# Patient Record
Sex: Female | Born: 1960 | Race: White | Hispanic: No | Smoking: Current every day smoker
Health system: Southern US, Community
[De-identification: ages and names within clinical notes are randomized; demographics above are authoritative.]

## PROBLEM LIST (undated history)

## (undated) DIAGNOSIS — E079 Disorder of thyroid, unspecified: Secondary | ICD-10-CM

## (undated) DIAGNOSIS — S060XAA Concussion with loss of consciousness status unknown, initial encounter: Secondary | ICD-10-CM

## (undated) DIAGNOSIS — K219 Gastro-esophageal reflux disease without esophagitis: Secondary | ICD-10-CM

## (undated) DIAGNOSIS — I10 Essential (primary) hypertension: Secondary | ICD-10-CM

## (undated) DIAGNOSIS — S060X9A Concussion with loss of consciousness of unspecified duration, initial encounter: Secondary | ICD-10-CM

## (undated) DIAGNOSIS — R42 Dizziness and giddiness: Secondary | ICD-10-CM

## (undated) HISTORY — DX: Concussion with loss of consciousness of unspecified duration, initial encounter: S06.0X9A

## (undated) HISTORY — PX: DIAGNOSTIC LAPAROSCOPY: SUR761

## (undated) HISTORY — DX: Concussion with loss of consciousness status unknown, initial encounter: S06.0XAA

## (undated) HISTORY — DX: Dizziness and giddiness: R42

---

## 1998-06-24 ENCOUNTER — Other Ambulatory Visit: Admission: RE | Admit: 1998-06-24 | Discharge: 1998-06-24 | Payer: Self-pay | Admitting: *Deleted

## 1998-07-22 ENCOUNTER — Encounter: Payer: Self-pay | Admitting: *Deleted

## 1998-07-22 ENCOUNTER — Ambulatory Visit (HOSPITAL_COMMUNITY): Admission: RE | Admit: 1998-07-22 | Discharge: 1998-07-22 | Payer: Self-pay

## 1998-10-14 ENCOUNTER — Emergency Department (HOSPITAL_COMMUNITY): Admission: EM | Admit: 1998-10-14 | Discharge: 1998-10-14 | Payer: Self-pay | Admitting: *Deleted

## 1999-09-16 ENCOUNTER — Other Ambulatory Visit: Admission: RE | Admit: 1999-09-16 | Discharge: 1999-09-16 | Payer: Self-pay | Admitting: *Deleted

## 2000-12-06 ENCOUNTER — Emergency Department (HOSPITAL_COMMUNITY): Admission: EM | Admit: 2000-12-06 | Discharge: 2000-12-07 | Payer: Self-pay | Admitting: Emergency Medicine

## 2002-05-01 ENCOUNTER — Encounter: Admission: RE | Admit: 2002-05-01 | Discharge: 2002-05-01 | Payer: Self-pay | Admitting: Internal Medicine

## 2002-05-01 ENCOUNTER — Encounter: Payer: Self-pay | Admitting: Internal Medicine

## 2005-11-09 ENCOUNTER — Encounter: Admission: RE | Admit: 2005-11-09 | Discharge: 2005-11-09 | Payer: Self-pay | Admitting: Internal Medicine

## 2006-06-23 ENCOUNTER — Encounter: Admission: RE | Admit: 2006-06-23 | Discharge: 2006-06-23 | Payer: Self-pay | Admitting: Internal Medicine

## 2007-05-09 ENCOUNTER — Ambulatory Visit (HOSPITAL_COMMUNITY): Admission: RE | Admit: 2007-05-09 | Discharge: 2007-05-09 | Payer: Self-pay | Admitting: Family Medicine

## 2010-06-07 ENCOUNTER — Encounter (HOSPITAL_BASED_OUTPATIENT_CLINIC_OR_DEPARTMENT_OTHER): Payer: Self-pay | Admitting: Internal Medicine

## 2013-01-22 ENCOUNTER — Emergency Department (HOSPITAL_COMMUNITY)
Admission: EM | Admit: 2013-01-22 | Discharge: 2013-01-23 | Disposition: A | Discharge status: Home / Self Care | Payer: Self-pay | Attending: Emergency Medicine | Admitting: Emergency Medicine

## 2013-01-22 ENCOUNTER — Encounter (HOSPITAL_COMMUNITY): Payer: Self-pay | Admitting: *Deleted

## 2013-01-22 DIAGNOSIS — Z862 Personal history of diseases of the blood and blood-forming organs and certain disorders involving the immune mechanism: Secondary | ICD-10-CM | POA: Insufficient documentation

## 2013-01-22 DIAGNOSIS — Z79899 Other long term (current) drug therapy: Secondary | ICD-10-CM | POA: Insufficient documentation

## 2013-01-22 DIAGNOSIS — N898 Other specified noninflammatory disorders of vagina: Secondary | ICD-10-CM | POA: Insufficient documentation

## 2013-01-22 DIAGNOSIS — K5900 Constipation, unspecified: Secondary | ICD-10-CM | POA: Insufficient documentation

## 2013-01-22 DIAGNOSIS — F17200 Nicotine dependence, unspecified, uncomplicated: Secondary | ICD-10-CM | POA: Insufficient documentation

## 2013-01-22 DIAGNOSIS — I10 Essential (primary) hypertension: Secondary | ICD-10-CM | POA: Insufficient documentation

## 2013-01-22 DIAGNOSIS — R197 Diarrhea, unspecified: Secondary | ICD-10-CM | POA: Insufficient documentation

## 2013-01-22 DIAGNOSIS — Z8639 Personal history of other endocrine, nutritional and metabolic disease: Secondary | ICD-10-CM | POA: Insufficient documentation

## 2013-01-22 DIAGNOSIS — K859 Acute pancreatitis without necrosis or infection, unspecified: Secondary | ICD-10-CM | POA: Insufficient documentation

## 2013-01-22 HISTORY — DX: Disorder of thyroid, unspecified: E07.9

## 2013-01-22 HISTORY — DX: Essential (primary) hypertension: I10

## 2013-01-22 HISTORY — DX: Gastro-esophageal reflux disease without esophagitis: K21.9

## 2013-01-22 NOTE — ED Notes (Signed)
Pt states that she has had left sided abd pain off and on for 2 years; pt states that the pain got more intense and consistent for the last 6 weeks; pt states that she saw her primary care physician and he has done blood work and advised needs colonoscopy; pt states that she does not have insurance and has put off being evaluated; pt also reports that she has had diarrhea and constipation off and on and also has yellowish tinged vaginal discharge off and on that is worse with increased pain.

## 2013-01-23 ENCOUNTER — Emergency Department (HOSPITAL_COMMUNITY): Payer: Self-pay

## 2013-01-23 LAB — BASIC METABOLIC PANEL
Calcium: 9.2 mg/dL (ref 8.4–10.5)
Chloride: 98 mEq/L (ref 96–112)
Creatinine, Ser: 0.67 mg/dL (ref 0.50–1.10)
GFR calc Af Amer: >90 mL/min (ref >90)
GFR calc non Af Amer: >90 mL/min (ref >90)
Potassium: 3.5 mEq/L (ref 3.5–5.1)

## 2013-01-23 LAB — CBC
HCT: 39.1 % (ref 36.0–46.0)
Hemoglobin: 13.5 g/dL (ref 12.0–15.0)
RDW: 13.4 % (ref 11.5–15.5)
WBC: 9.4 10*3/uL (ref 4.0–10.5)

## 2013-01-23 LAB — HEPATIC FUNCTION PANEL
Bilirubin, Direct: <0.1 mg/dL (ref 0.0–0.3)
Total Bilirubin: 0.2 mg/dL — ABNORMAL LOW (ref 0.3–1.2)
Total Protein: 6.7 g/dL (ref 6.0–8.3)

## 2013-01-23 LAB — HIV ANTIBODY (ROUTINE TESTING W REFLEX): HIV: NONREACTIVE

## 2013-01-23 LAB — URINALYSIS, ROUTINE W REFLEX MICROSCOPIC
Bilirubin Urine: NEGATIVE
Glucose, UA: NEGATIVE mg/dL
Hgb urine dipstick: NEGATIVE
Nitrite: NEGATIVE
Protein, ur: NEGATIVE mg/dL

## 2013-01-23 LAB — WET PREP, GENITAL

## 2013-01-23 LAB — URINE MICROSCOPIC-ADD ON

## 2013-01-23 LAB — OCCULT BLOOD X 1 CARD TO LAB, STOOL: Fecal Occult Bld: NEGATIVE

## 2013-01-23 MED ORDER — ONDANSETRON 8 MG PO TBDP: ORAL_TABLET | ORAL | Status: DC

## 2013-01-23 NOTE — ED Notes (Signed)
Pelvic supplies at bedside. 

## 2013-01-23 NOTE — ED Provider Notes (Signed)
CSN: [id number]     Arrival date & time 01/22/13  2259 History   First MD Initiated Contact with Patient 01/23/13 [phone]     Chief Complaint  Patient presents with  . Abdominal Pain   HPI  History provided by the patient. Patient is a 52 year old female with past history of hypertension, thyroid disease, laparoscopic surgery for endometriosis and GERD who presents with complaints of sharp severe left upper abdominal pain. Patient states that she has had similar symptoms off-and-on for the past 2 years. She has been seen by primary care provider and reports having general blood testing but has not had any answers to her symptoms. She states that her symptoms are usually worse as the day goes on and she generally has a lot of gas. She also reports having some alternating issues with constipation and occasional loose stools. She reports sometimes having Negus in her stools without any blood. She also reports having a mucousy vaginal discharge sometimes when her symptoms are really intense. She does report current vaginal discharge with her sharp abdominal pain. She denies any change in medications. Denies any heavy alcohol use but does state that she has recently been drinking more wine daily because she feels this helps her go to the bathroom more easily. Patient has never had a colonoscopy. Patient is a current every day smoker. No other specific aggravating or alleviating factors. No other associated symptoms. No fever, chills or sweats. No unexpected weight changes.    Past Medical History  Diagnosis Date  . Thyroid disease   . Hypertension   . GERD (gastroesophageal reflux disease)    History reviewed. No pertinent past surgical history. No family history on file. History  Substance Use Topics  . Smoking status: Current Every Day Smoker -- 0.50 packs/day    Types: Cigarettes  . Smokeless tobacco: Not on file  . Alcohol Use: Yes     Comment: socially   OB History   Grav Para Term Preterm  Abortions TAB SAB Ect Mult Living                 Review of Systems  Constitutional: Negative for fever, chills, diaphoresis and unexpected weight change.  Respiratory: Negative for cough and shortness of breath.   Cardiovascular: Negative for chest pain.  Gastrointestinal: Positive for nausea, abdominal pain, diarrhea and constipation. Negative for vomiting and blood in stool.  Genitourinary: Positive for vaginal discharge. Negative for dysuria, frequency, hematuria, flank pain and vaginal bleeding.  All other systems reviewed and are negative.    Allergies  Codeine  Home Medications   Current Outpatient Rx  Name  Route  Sig  Dispense  Refill  . ALPRAZolam (XANAX) 0.5 MG tablet   Oral   Take 0.5 mg by mouth 2 (two) times daily as needed for sleep or anxiety.         . metoprolol tartrate (LOPRESSOR) 25 MG tablet   Oral   Take 12.5 mg by mouth 2 (two) times daily.          BP 163/96  Pulse 94  Temp(Src) 98.5 F (36.9 C) (Oral)  Resp 20  SpO2 100% Physical Exam  Nursing note and vitals reviewed. Constitutional: She is oriented to person, place, and time. She appears well-developed and well-nourished. No distress.  HENT:  Head: Normocephalic.  Cardiovascular: Normal rate and regular rhythm.   Pulmonary/Chest: Effort normal and breath sounds normal. No respiratory distress. She has no wheezes. She has no rales.  Abdominal: Soft.  She exhibits no distension and no mass. There is tenderness. There is no rebound and no guarding.  Mild pain in the left upper quadrant  Genitourinary: Vaginal discharge found.  Chaperone was present. There is some erythema and strawberry appearance to the cervix. Thick white to yellowish cervical discharge present. No adnexal tenderness or mass. No cervical motion tenderness.  No gross blood on rectal exam no masses or tenderness.  Musculoskeletal: Normal range of motion. She exhibits no edema and no tenderness.  Neurological: She is alert  and oriented to person, place, and time.  Skin: Skin is warm and dry. No rash noted.  Psychiatric: She has a normal mood and affect. Her behavior is normal.    ED Course  Procedures   Results for orders placed during the hospital encounter of 01/22/13  GC/CHLAMYDIA PROBE AMP      Result Value Range   CT Probe RNA NEGATIVE  NEGATIVE   GC Probe RNA NEGATIVE  NEGATIVE  WET PREP, GENITAL      Result Value Range   Yeast Wet Prep HPF POC NONE SEEN  NONE SEEN   Trich, Wet Prep NONE SEEN  NONE SEEN   Clue Cells Wet Prep HPF POC FEW (*) NONE SEEN   WBC, Wet Prep HPF POC MANY (*) NONE SEEN  URINALYSIS, ROUTINE W REFLEX MICROSCOPIC      Result Value Range   Color, Urine YELLOW  YELLOW   APPearance CLEAR  CLEAR   Specific Gravity, Urine 1.012  1.005 - 1.030   pH 6.0  5.0 - 8.0   Glucose, UA NEGATIVE  NEGATIVE mg/dL   Hgb urine dipstick NEGATIVE  NEGATIVE   Bilirubin Urine NEGATIVE  NEGATIVE   Ketones, ur NEGATIVE  NEGATIVE mg/dL   Protein, ur NEGATIVE  NEGATIVE mg/dL   Urobilinogen, UA 0.2  0.0 - 1.0 mg/dL   Nitrite NEGATIVE  NEGATIVE   Leukocytes, UA SMALL (*) NEGATIVE  URINE MICROSCOPIC-ADD ON      Result Value Range   WBC, UA 3-6  <3 WBC/hpf  CBC      Result Value Range   WBC 9.4  4.0 - 10.5 K/uL   RBC 3.87  3.87 - 5.11 MIL/uL   Hemoglobin 13.5  12.0 - 15.0 g/dL   HCT 16.1  09.6 - 04.5 %   MCV 101.0 (*) 78.0 - 100.0 fL   MCH 34.9 (*) 26.0 - 34.0 pg   MCHC 34.5  30.0 - 36.0 g/dL   RDW 40.9  81.1 - 91.4 %   Platelets 289  150 - 400 K/uL  BASIC METABOLIC PANEL      Result Value Range   Sodium 136  135 - 145 mEq/L   Potassium 3.5  3.5 - 5.1 mEq/L   Chloride 98  96 - 112 mEq/L   CO2 28  19 - 32 mEq/L   Glucose, Bld 101 (*) 70 - 99 mg/dL   BUN 11  6 - 23 mg/dL   Creatinine, Ser 7.82  0.50 - 1.10 mg/dL   Calcium 9.2  8.4 - 95.6 mg/dL   GFR calc non Af Amer >90  >90 mL/min   GFR calc Af Amer >90  >90 mL/min  LIPASE, BLOOD      Result Value Range   Lipase 107 (*) 11 - 59  U/L  HEPATIC FUNCTION PANEL      Result Value Range   Total Protein 6.7  6.0 - 8.3 g/dL   Albumin 3.6  3.5 - 5.2  g/dL   AST 21  0 - 37 U/L   ALT 16  0 - 35 U/L   Alkaline Phosphatase 63  39 - 117 U/L   Total Bilirubin 0.2 (*) 0.3 - 1.2 mg/dL   Bilirubin, Direct <1.6  0.0 - 0.3 mg/dL   Indirect Bilirubin NOT CALCULATED  0.3 - 0.9 mg/dL  RPR      Result Value Range   RPR NON REACTIVE  NON REACTIVE  HIV ANTIBODY (ROUTINE TESTING)      Result Value Range   HIV NON REACTIVE  NON REACTIVE  OCCULT BLOOD X 1 CARD TO LAB, STOOL      Result Value Range   Fecal Occult Bld NEGATIVE  NEGATIVE       Imaging Review Dg Abd Acute W/chest  01/23/2013   *RADIOLOGY REPORT*  Clinical Data: Left mid abdominal pain for 2 months.  Worse this morning.  ACUTE ABDOMEN SERIES (ABDOMEN 2 VIEW & CHEST 1 VIEW)  Comparison: Chest 05/09/2007  Findings: The pulmonary hyperinflation suggesting emphysema.  Heart size and pulmonary vascularity are normal.  No focal consolidation or airspace disease in the lungs.  No blunting of costophrenic angles.  No pneumothorax.  Mediastinal contours appear intact.  Old right rib fracture.  No significant change since previous chest.  Gas and stool throughout the colon.  No small or large bowel distension.  No free intra-abdominal air.  No abnormal air fluid levels.  Vascular calcifications.  No radiopaque stones.  Mild degenerative changes in the spine.  IMPRESSION: Emphysematous changes in the lungs.  No evidence of active pulmonary disease.  Nonobstructive bowel gas pattern.   Original Report Authenticated By: Burman Nieves, M.D.    MDM   1. Pancreatitis   2. Vaginal discharge      1:00 AM patient seen and evaluated. Patient currently resting in bed appears comfortable in no acute distress or significant discomfort. Patient with very mild left upper abdominal tenderness. He is describing intermittent recurrent symptoms of vaginal discharge. Will perform pelvic examination to  rule out fistula.  Patient continues to be without any significant discomfort. Her lipase is mildly elevated. She has reported some increased wine use. No sniff and exam findings concerning for choledocholithiasis.  At this time we'll give prescriptions for Zofran to help with nausea. Patient instructed to have simple clear liquid diet. A GI referral and OB/GYN referral provided for additional workup. Patient discussed with attending physician agrees with plan.    Angus Seller, PA-C 01/23/13 [phone]

## 2013-01-24 ENCOUNTER — Telehealth (HOSPITAL_COMMUNITY): Payer: Self-pay | Admitting: Emergency Medicine

## 2013-01-24 NOTE — ED Provider Notes (Signed)
Medical screening examination/treatment/procedure(s) were performed by non-physician practitioner and as supervising physician I was immediately available for consultation/collaboration.  Olivia Mackie, MD 01/24/13 [phone]

## 2013-01-24 NOTE — ED Notes (Signed)
Pt calling for lab results.  ID verified x 2.  Pt informed STD tests were negative or non reactive

## 2013-01-29 ENCOUNTER — Other Ambulatory Visit: Payer: Self-pay | Admitting: Gastroenterology

## 2013-01-29 DIAGNOSIS — R109 Unspecified abdominal pain: Secondary | ICD-10-CM

## 2013-01-30 ENCOUNTER — Ambulatory Visit
Admission: RE | Admit: 2013-01-30 | Discharge: 2013-01-30 | Disposition: A | Discharge status: Home / Self Care | Payer: No Typology Code available for payment source | Source: Ambulatory Visit | Attending: Gastroenterology | Admitting: Gastroenterology

## 2013-01-30 DIAGNOSIS — R109 Unspecified abdominal pain: Secondary | ICD-10-CM

## 2013-01-30 MED ORDER — IOHEXOL 300 MG/ML  SOLN
100.0000 mL | Freq: Once | INTRAMUSCULAR | Status: AC | PRN
  Administered 2013-01-30: 100 mL via INTRAVENOUS

## 2013-03-22 ENCOUNTER — Other Ambulatory Visit: Payer: Self-pay

## 2013-04-04 ENCOUNTER — Other Ambulatory Visit: Payer: Self-pay | Admitting: Obstetrics and Gynecology

## 2013-04-04 DIAGNOSIS — Z1231 Encounter for screening mammogram for malignant neoplasm of breast: Secondary | ICD-10-CM

## 2013-05-01 ENCOUNTER — Ambulatory Visit (HOSPITAL_COMMUNITY)
Admission: RE | Admit: 2013-05-01 | Discharge: 2013-05-01 | Disposition: A | Discharge status: Home / Self Care | Payer: Self-pay | Source: Ambulatory Visit | Attending: Obstetrics and Gynecology | Admitting: Obstetrics and Gynecology

## 2013-05-01 ENCOUNTER — Other Ambulatory Visit: Payer: Self-pay | Admitting: Obstetrics and Gynecology

## 2013-05-01 ENCOUNTER — Encounter (HOSPITAL_COMMUNITY): Payer: Self-pay

## 2013-05-01 VITALS — BP 138/92 | Temp 99.6°F | Ht 62.0 in | Wt 103.6 lb

## 2013-05-01 DIAGNOSIS — N898 Other specified noninflammatory disorders of vagina: Secondary | ICD-10-CM

## 2013-05-01 DIAGNOSIS — Z01419 Encounter for gynecological examination (general) (routine) without abnormal findings: Secondary | ICD-10-CM

## 2013-05-01 DIAGNOSIS — Z1231 Encounter for screening mammogram for malignant neoplasm of breast: Secondary | ICD-10-CM

## 2013-05-01 NOTE — Patient Instructions (Signed)
Taught [name] [name] how to perform BSE. Let her know [facility] will cover Pap smears every 3 years unless has a history of abnormal Pap smears. Let patient know will follow up with her within the next couple weeks with results by letter and/or phone. Smoking cessation discussed with patient. [name] [name] verbalized understanding. Patient escorted to mammography for a screening mammography.  Amundson, Kathaleen Maser, RN 3:40 PM

## 2013-05-01 NOTE — Progress Notes (Signed)
No complaints today.  Pap Smear:    Pap smear completed today. Patient thinks last Pap smear was around February 2012 or 2013 at one of the free cervical cancer screenings at the [facility] per patient and normal. Per patient has a history of an abnormal Pap smear around 15 years ago that required cryotherapy for follow up. No Pap smear results in EPIC.  Physical exam: Breasts Breasts symmetrical. No skin abnormalities bilateral breasts. No nipple retraction bilateral breasts. No nipple discharge bilateral breasts. No lymphadenopathy. No lumps palpated bilateral breasts. No complaints of pain or tenderness on exam. Patient escorted to mammography for a screening mammogram.          Pelvic/Bimanual   Ext Genitalia No lesions, no swelling and no discharge observed on external genitalia.         Vagina Vagina pink and normal texture. No lesions and yellow creamy colored discharge observed in vagina. Positive whiff test. Wet prep completed.          Cervix Cervix is present. Cervix pink and of normal texture. Yellow creamy colored discharge observed on cervix.    Uterus Uterus is present and palpable. Uterus in normal position and normal size.        Adnexae Bilateral ovaries present and palpable. No tenderness on palpation.          Rectovaginal No rectal exam completed today since patient had no rectal complaints. No skin abnormalities observed on exam.

## 2013-05-02 ENCOUNTER — Other Ambulatory Visit (HOSPITAL_COMMUNITY): Payer: Self-pay | Admitting: *Deleted

## 2013-05-02 ENCOUNTER — Telehealth (HOSPITAL_COMMUNITY): Payer: Self-pay | Admitting: *Deleted

## 2013-05-02 DIAGNOSIS — N760 Acute vaginitis: Secondary | ICD-10-CM

## 2013-05-02 DIAGNOSIS — B9689 Other specified bacterial agents as the cause of diseases classified elsewhere: Secondary | ICD-10-CM

## 2013-05-02 LAB — WET PREP, GENITAL: Trich, Wet Prep: NONE SEEN

## 2013-05-02 MED ORDER — METRONIDAZOLE 500 MG PO TABS
500.0000 mg | ORAL_TABLET | Freq: Two times a day (BID) | ORAL | Status: DC
Start: 2013-05-02 — End: 2019-03-28

## 2013-05-02 NOTE — Telephone Encounter (Signed)
Telephoned patient at home # and discussed negative pap smear results. Also advised patient of medication called in to pharmacy for bacterial vaginosis. Patient voiced understanding.

## 2014-03-18 ENCOUNTER — Encounter (HOSPITAL_COMMUNITY): Payer: Self-pay

## 2015-06-12 ENCOUNTER — Other Ambulatory Visit: Payer: Self-pay

## 2015-06-12 DIAGNOSIS — Z803 Family history of malignant neoplasm of breast: Secondary | ICD-10-CM

## 2015-06-12 DIAGNOSIS — Z1231 Encounter for screening mammogram for malignant neoplasm of breast: Secondary | ICD-10-CM

## 2015-09-19 ENCOUNTER — Ambulatory Visit
Admission: RE | Admit: 2015-09-19 | Discharge: 2015-09-19 | Disposition: A | Discharge status: Home / Self Care | Payer: BLUE CROSS/BLUE SHIELD | Source: Ambulatory Visit

## 2015-09-19 DIAGNOSIS — Z1231 Encounter for screening mammogram for malignant neoplasm of breast: Secondary | ICD-10-CM

## 2015-09-19 DIAGNOSIS — Z803 Family history of malignant neoplasm of breast: Secondary | ICD-10-CM

## 2015-10-27 DIAGNOSIS — Z0000 Encounter for general adult medical examination without abnormal findings: Secondary | ICD-10-CM | POA: Diagnosis not present

## 2015-10-27 DIAGNOSIS — R8299 Other abnormal findings in urine: Secondary | ICD-10-CM | POA: Diagnosis not present

## 2015-10-27 DIAGNOSIS — E038 Other specified hypothyroidism: Secondary | ICD-10-CM | POA: Diagnosis not present

## 2015-10-27 DIAGNOSIS — E784 Other hyperlipidemia: Secondary | ICD-10-CM | POA: Diagnosis not present

## 2015-10-27 DIAGNOSIS — N390 Urinary tract infection, site not specified: Secondary | ICD-10-CM | POA: Diagnosis not present

## 2015-11-03 DIAGNOSIS — Z1389 Encounter for screening for other disorder: Secondary | ICD-10-CM | POA: Diagnosis not present

## 2015-11-03 DIAGNOSIS — M79642 Pain in left hand: Secondary | ICD-10-CM | POA: Diagnosis not present

## 2015-11-03 DIAGNOSIS — I8390 Asymptomatic varicose veins of unspecified lower extremity: Secondary | ICD-10-CM | POA: Diagnosis not present

## 2015-11-03 DIAGNOSIS — Z0000 Encounter for general adult medical examination without abnormal findings: Secondary | ICD-10-CM | POA: Diagnosis not present

## 2015-11-03 DIAGNOSIS — M21612 Bunion of left foot: Secondary | ICD-10-CM | POA: Diagnosis not present

## 2015-11-03 DIAGNOSIS — L259 Unspecified contact dermatitis, unspecified cause: Secondary | ICD-10-CM | POA: Diagnosis not present

## 2015-11-03 DIAGNOSIS — Z681 Body mass index (BMI) 19 or less, adult: Secondary | ICD-10-CM | POA: Diagnosis not present

## 2015-11-07 ENCOUNTER — Other Ambulatory Visit: Payer: Self-pay | Admitting: *Deleted

## 2015-11-07 DIAGNOSIS — Z1212 Encounter for screening for malignant neoplasm of rectum: Secondary | ICD-10-CM | POA: Diagnosis not present

## 2015-11-07 DIAGNOSIS — I83892 Varicose veins of left lower extremities with other complications: Secondary | ICD-10-CM

## 2015-11-24 DIAGNOSIS — N809 Endometriosis, unspecified: Secondary | ICD-10-CM | POA: Diagnosis not present

## 2015-11-24 DIAGNOSIS — Z681 Body mass index (BMI) 19 or less, adult: Secondary | ICD-10-CM | POA: Diagnosis not present

## 2015-11-24 DIAGNOSIS — Z01419 Encounter for gynecological examination (general) (routine) without abnormal findings: Secondary | ICD-10-CM | POA: Diagnosis not present

## 2015-11-24 DIAGNOSIS — Z1151 Encounter for screening for human papillomavirus (HPV): Secondary | ICD-10-CM | POA: Diagnosis not present

## 2016-01-07 ENCOUNTER — Encounter: Payer: Self-pay | Admitting: Surgery

## 2016-01-12 ENCOUNTER — Ambulatory Visit (HOSPITAL_COMMUNITY)
Admission: RE | Admit: 2016-01-12 | Discharge: 2016-01-12 | Disposition: A | Discharge status: Home / Self Care | Payer: BLUE CROSS/BLUE SHIELD | Source: Ambulatory Visit | Attending: Surgery | Admitting: Surgery

## 2016-01-12 ENCOUNTER — Ambulatory Visit (INDEPENDENT_AMBULATORY_CARE_PROVIDER_SITE_OTHER): Payer: BLUE CROSS/BLUE SHIELD | Admitting: Surgery

## 2016-01-12 ENCOUNTER — Encounter: Payer: Self-pay | Admitting: Surgery

## 2016-01-12 VITALS — BP 149/95 | HR 74 | Temp 98.0°F | Resp 20 | Ht 62.5 in | Wt 106.3 lb

## 2016-01-12 DIAGNOSIS — M79605 Pain in left leg: Secondary | ICD-10-CM

## 2016-01-12 DIAGNOSIS — I83893 Varicose veins of bilateral lower extremities with other complications: Secondary | ICD-10-CM | POA: Diagnosis not present

## 2016-01-12 DIAGNOSIS — I83892 Varicose veins of left lower extremities with other complications: Secondary | ICD-10-CM | POA: Diagnosis not present

## 2016-01-12 DIAGNOSIS — I82812 Embolism and thrombosis of superficial veins of left lower extremities: Secondary | ICD-10-CM | POA: Insufficient documentation

## 2016-01-12 DIAGNOSIS — M79604 Pain in right leg: Secondary | ICD-10-CM | POA: Insufficient documentation

## 2016-01-12 DIAGNOSIS — I8390 Asymptomatic varicose veins of unspecified lower extremity: Secondary | ICD-10-CM

## 2016-01-12 DIAGNOSIS — I868 Varicose veins of other specified sites: Secondary | ICD-10-CM | POA: Diagnosis not present

## 2016-01-12 DIAGNOSIS — R609 Edema, unspecified: Secondary | ICD-10-CM | POA: Diagnosis present

## 2016-01-12 NOTE — Progress Notes (Signed)
Vitals:   01/12/16 1206 01/12/16 1217  BP: (!) 147/88 (!) 149/95  Pulse: 74   Resp: 20   Temp: 98 F (36.7 C)   TempSrc: Oral   Weight: 106 lb 4.8 oz (48.2 kg)   Height: 5' 2.5" (1.588 m)    Vitals:   01/12/16 1206 01/12/16 1217  BP: (!) 147/88 (!) 149/95  Pulse: 74   Resp: 20   Temp: 98 F (36.7 C)   TempSrc: Oral   Weight: 106 lb 4.8 oz (48.2 kg)   Height: 5' 2.5" (1.588 m)                                         [facility]  Patient name: [name] [name] MRN: [id number] DOB: Feb 27, 1961 Sex: female  REFERRING PHYSICIAN: Dr. Sharlett Iles  REASON FOR CONSULT: leg veins  HPI: [name] [name] is a 55 y.o. female, who is Referred today for evaluation of trauma leg veins.  The patient notes that these are becoming more unsightly and wants to have them evaluated.  She denies a history of DVT.  She does not work compression stockings.  She does not have edema.  The patient is a current smoker.  She suffers from hyperlipidemia and hypertension which are medically managed.  Past Medical History:  Diagnosis Date  . GERD (gastroesophageal reflux disease)   . Hypertension   . Thyroid disease     Family History  Problem Relation Age of Onset  . Breast cancer Mother   . Rheum arthritis Mother   . Cancer Brother 50    mult myeloma  . Cancer Father     gum  . Heart failure Father   . Kidney disease Father     SOCIAL HISTORY: Social History   Social History  . Marital status: Divorced    Spouse name: N/A  . Number of children: N/A  . Years of education: N/A   Occupational History  . Not on file.   Social History Main Topics  . Smoking status: Current Every Day Smoker    Packs/day: 0.50    Years: 20.00    Types: Cigarettes  . Smokeless tobacco: Not on file  . Alcohol use Yes     Comment: socially  . Drug use: No  . Sexual activity: Yes   Other Topics Concern  . Not on file   Social History Narrative  . No narrative on file     Allergies  Allergen Reactions  . Codeine Rash    halucinations    Current Outpatient Prescriptions  Medication Sig Dispense Refill  . ALPRAZolam (XANAX) 0.5 MG tablet Take 0.5 mg by mouth 2 (two) times daily as needed for sleep or anxiety.    . metoprolol tartrate (LOPRESSOR) 25 MG tablet Take 12.5 mg by mouth 2 (two) times daily.    . metroNIDAZOLE (FLAGYL) 500 MG tablet Take 1 tablet (500 mg total) by mouth 2 (two) times daily. 14 tablet 0  . ondansetron (ZOFRAN ODT) 8 MG disintegrating tablet 8mg  ODT q4 hours prn nausea 20 tablet 0  . Vitamin D, Ergocalciferol, (DRISDOL) 50000 units CAPS capsule Take 50,000 Units by mouth every 7 (seven) days.     No current facility-administered medications for this visit.     REVIEW OF SYSTEMS:  [X]  denotes positive finding, [ ]  denotes negative finding Cardiac  Comments:  Chest  pain or chest pressure:    Shortness of breath upon exertion:    Short of breath when lying flat:    Irregular heart rhythm:        Vascular    Pain in calf, thigh, or hip brought on by ambulation:    Pain in feet at night that wakes you up from your sleep:     Blood clot in your veins:    Leg swelling:         Pulmonary    Oxygen at home:    Productive cough:     Wheezing:         Neurologic    Sudden weakness in arms or legs:     Sudden numbness in arms or legs:     Sudden onset of difficulty speaking or slurred speech:    Temporary loss of vision in one eye:     Problems with dizziness:         Gastrointestinal    Blood in stool:     Vomited blood:         Genitourinary    Burning when urinating:     Blood in urine:        Psychiatric    Major depression:         Hematologic    Bleeding problems:    Problems with blood clotting too easily:        Skin    Rashes or ulcers:        Constitutional    Fever or chills:      PHYSICAL EXAM: Vitals:   01/12/16 1206 01/12/16 1217  BP: (!) 147/88 (!) 149/95  Pulse: 74   Resp: 20    Temp: 98 F (36.7 C)   TempSrc: Oral   Weight: 106 lb 4.8 oz (48.2 kg)   Height: 5' 2.5" (1.588 m)     GENERAL: The patient is a well-nourished female, in no acute distress. The vital signs are documented above. CARDIAC: There is a regular rate and rhythm.  VASCULAR: Spider veins along both ankles.  Pedal pulses PULMONARY: There is good air exchange bilaterally without wheezing or rales. MUSCULOSKELETAL: There are no major deformities or cyanosis. NEUROLOGIC: No focal weakness or paresthesias are detected. SKIN: There are no ulcers or rashes noted. PSYCHIATRIC: The patient has a normal affect.  DATA:  I have reviewed her vascular lab studies.  She has no significant venous insufficiency.  There is mild reflux in the left common femoral vein.  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  ASSESSMENT AND PLAN: I told the patient that she does not have significant axial reflux.  I feel her spider veins are cosmetic and that she would benefit from sclerotherapy.  I have given her the number to contact Thea Silversmith.   Annamarie Major, MD [facility] [phone] Pager [phone]

## 2016-06-21 DIAGNOSIS — K582 Mixed irritable bowel syndrome: Secondary | ICD-10-CM | POA: Diagnosis not present

## 2016-06-21 DIAGNOSIS — Z86010 Personal history of colonic polyps: Secondary | ICD-10-CM | POA: Diagnosis not present

## 2016-06-21 DIAGNOSIS — R140 Abdominal distension (gaseous): Secondary | ICD-10-CM | POA: Diagnosis not present

## 2016-09-20 ENCOUNTER — Other Ambulatory Visit: Payer: Self-pay | Admitting: Internal Medicine

## 2016-09-20 DIAGNOSIS — Z1231 Encounter for screening mammogram for malignant neoplasm of breast: Secondary | ICD-10-CM

## 2016-10-05 ENCOUNTER — Ambulatory Visit
Admission: RE | Admit: 2016-10-05 | Discharge: 2016-10-05 | Disposition: A | Discharge status: Home / Self Care | Payer: BLUE CROSS/BLUE SHIELD | Source: Ambulatory Visit | Attending: Internal Medicine | Admitting: Internal Medicine

## 2016-10-05 DIAGNOSIS — Z1231 Encounter for screening mammogram for malignant neoplasm of breast: Secondary | ICD-10-CM

## 2016-10-08 DIAGNOSIS — M7710 Lateral epicondylitis, unspecified elbow: Secondary | ICD-10-CM | POA: Diagnosis not present

## 2016-10-08 DIAGNOSIS — M79646 Pain in unspecified finger(s): Secondary | ICD-10-CM | POA: Diagnosis not present

## 2016-10-08 DIAGNOSIS — R5383 Other fatigue: Secondary | ICD-10-CM | POA: Diagnosis not present

## 2016-10-08 DIAGNOSIS — M79641 Pain in right hand: Secondary | ICD-10-CM | POA: Diagnosis not present

## 2016-10-21 DIAGNOSIS — R1084 Generalized abdominal pain: Secondary | ICD-10-CM | POA: Diagnosis not present

## 2016-10-25 DIAGNOSIS — R1032 Left lower quadrant pain: Secondary | ICD-10-CM | POA: Diagnosis not present

## 2016-11-01 DIAGNOSIS — E038 Other specified hypothyroidism: Secondary | ICD-10-CM | POA: Diagnosis not present

## 2016-11-01 DIAGNOSIS — I10 Essential (primary) hypertension: Secondary | ICD-10-CM | POA: Diagnosis not present

## 2016-11-01 DIAGNOSIS — Z0000 Encounter for general adult medical examination without abnormal findings: Secondary | ICD-10-CM | POA: Diagnosis not present

## 2016-11-05 DIAGNOSIS — R05 Cough: Secondary | ICD-10-CM | POA: Diagnosis not present

## 2016-11-05 DIAGNOSIS — R1011 Right upper quadrant pain: Secondary | ICD-10-CM | POA: Diagnosis not present

## 2016-11-05 DIAGNOSIS — Z1389 Encounter for screening for other disorder: Secondary | ICD-10-CM | POA: Diagnosis not present

## 2016-11-05 DIAGNOSIS — F418 Other specified anxiety disorders: Secondary | ICD-10-CM | POA: Diagnosis not present

## 2016-11-05 DIAGNOSIS — F17210 Nicotine dependence, cigarettes, uncomplicated: Secondary | ICD-10-CM | POA: Diagnosis not present

## 2016-11-05 DIAGNOSIS — Z1212 Encounter for screening for malignant neoplasm of rectum: Secondary | ICD-10-CM | POA: Diagnosis not present

## 2016-11-05 DIAGNOSIS — Z0000 Encounter for general adult medical examination without abnormal findings: Secondary | ICD-10-CM | POA: Diagnosis not present

## 2017-01-06 ENCOUNTER — Other Ambulatory Visit: Payer: Self-pay | Admitting: Internal Medicine

## 2017-01-06 DIAGNOSIS — R1011 Right upper quadrant pain: Secondary | ICD-10-CM

## 2017-01-12 ENCOUNTER — Other Ambulatory Visit: Payer: BLUE CROSS/BLUE SHIELD

## 2017-01-24 ENCOUNTER — Other Ambulatory Visit: Payer: BLUE CROSS/BLUE SHIELD

## 2017-03-15 DIAGNOSIS — W19XXXA Unspecified fall, initial encounter: Secondary | ICD-10-CM | POA: Diagnosis not present

## 2017-03-15 DIAGNOSIS — L209 Atopic dermatitis, unspecified: Secondary | ICD-10-CM | POA: Diagnosis not present

## 2017-03-15 DIAGNOSIS — M545 Low back pain: Secondary | ICD-10-CM | POA: Diagnosis not present

## 2017-03-15 DIAGNOSIS — Z681 Body mass index (BMI) 19 or less, adult: Secondary | ICD-10-CM | POA: Diagnosis not present

## 2017-05-02 ENCOUNTER — Encounter (HOSPITAL_COMMUNITY): Payer: Self-pay

## 2017-10-24 DIAGNOSIS — I10 Essential (primary) hypertension: Secondary | ICD-10-CM | POA: Diagnosis not present

## 2017-10-24 DIAGNOSIS — R82998 Other abnormal findings in urine: Secondary | ICD-10-CM | POA: Diagnosis not present

## 2017-10-24 DIAGNOSIS — Z0000 Encounter for general adult medical examination without abnormal findings: Secondary | ICD-10-CM | POA: Diagnosis not present

## 2017-10-24 DIAGNOSIS — E038 Other specified hypothyroidism: Secondary | ICD-10-CM | POA: Diagnosis not present

## 2017-10-28 DIAGNOSIS — Z1389 Encounter for screening for other disorder: Secondary | ICD-10-CM | POA: Diagnosis not present

## 2017-10-28 DIAGNOSIS — R1011 Right upper quadrant pain: Secondary | ICD-10-CM | POA: Diagnosis not present

## 2017-10-28 DIAGNOSIS — I700 Atherosclerosis of aorta: Secondary | ICD-10-CM | POA: Diagnosis not present

## 2017-10-28 DIAGNOSIS — Z0000 Encounter for general adult medical examination without abnormal findings: Secondary | ICD-10-CM | POA: Diagnosis not present

## 2017-10-28 DIAGNOSIS — M545 Low back pain: Secondary | ICD-10-CM | POA: Diagnosis not present

## 2017-10-28 DIAGNOSIS — F17210 Nicotine dependence, cigarettes, uncomplicated: Secondary | ICD-10-CM | POA: Diagnosis not present

## 2017-10-31 ENCOUNTER — Other Ambulatory Visit: Payer: Self-pay | Admitting: Internal Medicine

## 2017-10-31 DIAGNOSIS — R1011 Right upper quadrant pain: Secondary | ICD-10-CM

## 2017-10-31 DIAGNOSIS — F17210 Nicotine dependence, cigarettes, uncomplicated: Secondary | ICD-10-CM

## 2017-10-31 DIAGNOSIS — I700 Atherosclerosis of aorta: Secondary | ICD-10-CM

## 2017-10-31 DIAGNOSIS — Z1212 Encounter for screening for malignant neoplasm of rectum: Secondary | ICD-10-CM | POA: Diagnosis not present

## 2017-11-01 ENCOUNTER — Other Ambulatory Visit: Payer: Self-pay | Admitting: Internal Medicine

## 2017-11-01 DIAGNOSIS — F17210 Nicotine dependence, cigarettes, uncomplicated: Secondary | ICD-10-CM

## 2017-11-14 ENCOUNTER — Other Ambulatory Visit: Payer: Self-pay | Admitting: Internal Medicine

## 2017-11-14 DIAGNOSIS — Z1231 Encounter for screening mammogram for malignant neoplasm of breast: Secondary | ICD-10-CM

## 2017-11-21 ENCOUNTER — Ambulatory Visit
Admission: RE | Admit: 2017-11-21 | Discharge: 2017-11-21 | Disposition: A | Discharge status: Home / Self Care | Payer: BLUE CROSS/BLUE SHIELD | Source: Ambulatory Visit | Attending: Internal Medicine | Admitting: Internal Medicine

## 2017-11-21 ENCOUNTER — Other Ambulatory Visit: Payer: BLUE CROSS/BLUE SHIELD

## 2017-11-21 DIAGNOSIS — K7689 Other specified diseases of liver: Secondary | ICD-10-CM | POA: Diagnosis not present

## 2017-11-21 DIAGNOSIS — R1011 Right upper quadrant pain: Secondary | ICD-10-CM

## 2017-11-21 DIAGNOSIS — F17210 Nicotine dependence, cigarettes, uncomplicated: Secondary | ICD-10-CM

## 2017-11-21 DIAGNOSIS — I700 Atherosclerosis of aorta: Secondary | ICD-10-CM

## 2017-11-22 ENCOUNTER — Ambulatory Visit
Admission: RE | Admit: 2017-11-22 | Discharge: 2017-11-22 | Disposition: A | Discharge status: Home / Self Care | Payer: BLUE CROSS/BLUE SHIELD | Source: Ambulatory Visit | Attending: Internal Medicine | Admitting: Internal Medicine

## 2017-11-22 DIAGNOSIS — F17210 Nicotine dependence, cigarettes, uncomplicated: Secondary | ICD-10-CM

## 2017-12-05 ENCOUNTER — Ambulatory Visit
Admission: RE | Admit: 2017-12-05 | Discharge: 2017-12-05 | Disposition: A | Discharge status: Home / Self Care | Payer: BLUE CROSS/BLUE SHIELD | Source: Ambulatory Visit | Attending: Internal Medicine | Admitting: Internal Medicine

## 2017-12-05 ENCOUNTER — Other Ambulatory Visit: Payer: Self-pay | Admitting: Internal Medicine

## 2017-12-05 DIAGNOSIS — Z1231 Encounter for screening mammogram for malignant neoplasm of breast: Secondary | ICD-10-CM

## 2017-12-05 DIAGNOSIS — R05 Cough: Secondary | ICD-10-CM

## 2017-12-05 DIAGNOSIS — F17210 Nicotine dependence, cigarettes, uncomplicated: Secondary | ICD-10-CM

## 2017-12-05 DIAGNOSIS — Z720 Tobacco use: Secondary | ICD-10-CM

## 2017-12-05 DIAGNOSIS — R053 Chronic cough: Secondary | ICD-10-CM

## 2018-01-06 ENCOUNTER — Ambulatory Visit
Admission: RE | Admit: 2018-01-06 | Discharge: 2018-01-06 | Disposition: A | Discharge status: Home / Self Care | Payer: BLUE CROSS/BLUE SHIELD | Source: Ambulatory Visit | Attending: Internal Medicine | Admitting: Internal Medicine

## 2018-01-06 DIAGNOSIS — J9811 Atelectasis: Secondary | ICD-10-CM | POA: Diagnosis not present

## 2018-01-06 DIAGNOSIS — R05 Cough: Secondary | ICD-10-CM

## 2018-01-06 DIAGNOSIS — F17210 Nicotine dependence, cigarettes, uncomplicated: Secondary | ICD-10-CM

## 2018-01-06 DIAGNOSIS — R053 Chronic cough: Secondary | ICD-10-CM

## 2018-01-06 DIAGNOSIS — Z720 Tobacco use: Secondary | ICD-10-CM

## 2018-01-13 DIAGNOSIS — Z23 Encounter for immunization: Secondary | ICD-10-CM | POA: Diagnosis not present

## 2018-01-13 DIAGNOSIS — J209 Acute bronchitis, unspecified: Secondary | ICD-10-CM | POA: Diagnosis not present

## 2018-01-13 DIAGNOSIS — E039 Hypothyroidism, unspecified: Secondary | ICD-10-CM | POA: Diagnosis not present

## 2018-01-13 DIAGNOSIS — R5381 Other malaise: Secondary | ICD-10-CM | POA: Diagnosis not present

## 2018-03-09 DIAGNOSIS — E038 Other specified hypothyroidism: Secondary | ICD-10-CM | POA: Diagnosis not present

## 2018-08-04 DIAGNOSIS — R0602 Shortness of breath: Secondary | ICD-10-CM | POA: Diagnosis not present

## 2018-08-04 DIAGNOSIS — F17200 Nicotine dependence, unspecified, uncomplicated: Secondary | ICD-10-CM | POA: Diagnosis not present

## 2018-08-04 DIAGNOSIS — J069 Acute upper respiratory infection, unspecified: Secondary | ICD-10-CM | POA: Diagnosis not present

## 2018-10-02 DIAGNOSIS — I10 Essential (primary) hypertension: Secondary | ICD-10-CM | POA: Diagnosis not present

## 2018-10-02 DIAGNOSIS — F17210 Nicotine dependence, cigarettes, uncomplicated: Secondary | ICD-10-CM | POA: Diagnosis not present

## 2018-10-02 DIAGNOSIS — J302 Other seasonal allergic rhinitis: Secondary | ICD-10-CM | POA: Diagnosis not present

## 2018-10-02 DIAGNOSIS — R42 Dizziness and giddiness: Secondary | ICD-10-CM | POA: Diagnosis not present

## 2018-10-02 DIAGNOSIS — F419 Anxiety disorder, unspecified: Secondary | ICD-10-CM | POA: Diagnosis not present

## 2018-11-06 DIAGNOSIS — E038 Other specified hypothyroidism: Secondary | ICD-10-CM | POA: Diagnosis not present

## 2018-11-06 DIAGNOSIS — Z0000 Encounter for general adult medical examination without abnormal findings: Secondary | ICD-10-CM | POA: Diagnosis not present

## 2018-11-06 DIAGNOSIS — I10 Essential (primary) hypertension: Secondary | ICD-10-CM | POA: Diagnosis not present

## 2018-11-06 DIAGNOSIS — E7849 Other hyperlipidemia: Secondary | ICD-10-CM | POA: Diagnosis not present

## 2018-11-13 DIAGNOSIS — F419 Anxiety disorder, unspecified: Secondary | ICD-10-CM | POA: Diagnosis not present

## 2018-11-13 DIAGNOSIS — Z0000 Encounter for general adult medical examination without abnormal findings: Secondary | ICD-10-CM | POA: Diagnosis not present

## 2018-11-13 DIAGNOSIS — R82998 Other abnormal findings in urine: Secondary | ICD-10-CM | POA: Diagnosis not present

## 2018-11-13 DIAGNOSIS — L989 Disorder of the skin and subcutaneous tissue, unspecified: Secondary | ICD-10-CM | POA: Diagnosis not present

## 2018-11-13 DIAGNOSIS — W19XXXA Unspecified fall, initial encounter: Secondary | ICD-10-CM | POA: Diagnosis not present

## 2018-11-13 DIAGNOSIS — Z1331 Encounter for screening for depression: Secondary | ICD-10-CM | POA: Diagnosis not present

## 2018-11-13 DIAGNOSIS — I10 Essential (primary) hypertension: Secondary | ICD-10-CM | POA: Diagnosis not present

## 2018-11-13 DIAGNOSIS — F17200 Nicotine dependence, unspecified, uncomplicated: Secondary | ICD-10-CM | POA: Diagnosis not present

## 2019-01-26 DIAGNOSIS — Z20828 Contact with and (suspected) exposure to other viral communicable diseases: Secondary | ICD-10-CM | POA: Diagnosis not present

## 2019-02-19 DIAGNOSIS — Z0000 Encounter for general adult medical examination without abnormal findings: Secondary | ICD-10-CM | POA: Diagnosis not present

## 2019-03-12 IMAGING — MG DIGITAL SCREENING BILATERAL MAMMOGRAM WITH TOMO AND CAD
8 series · 9 of 24 positions shown · non-contrast
Comparison: Previous exam(s).

CLINICAL DATA: Screening.

EXAM:
DIGITAL SCREENING BILATERAL MAMMOGRAM WITH TOMO AND CAD

[L MLO synth-2D]
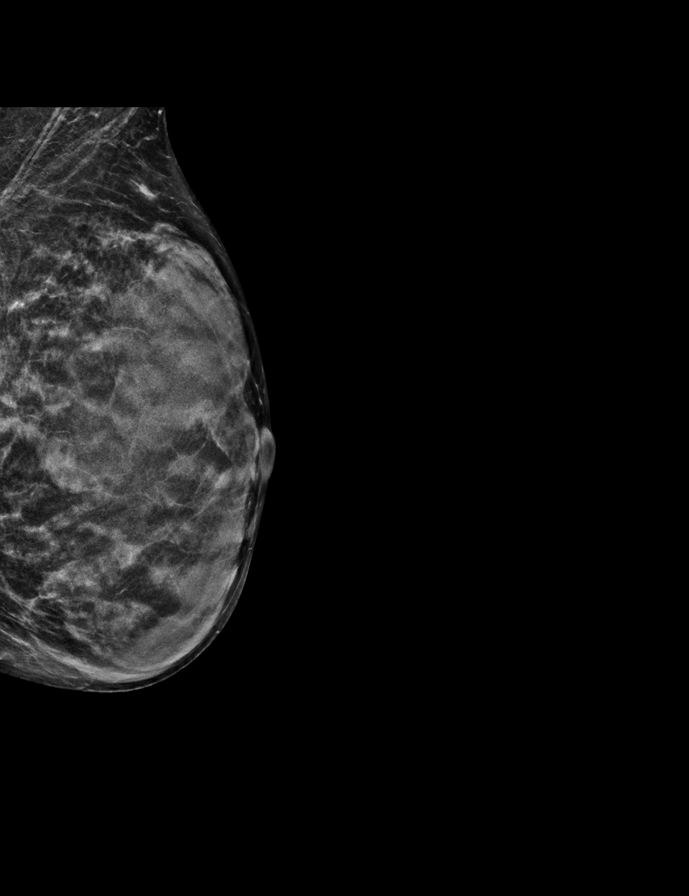

[R CC synth-2D]
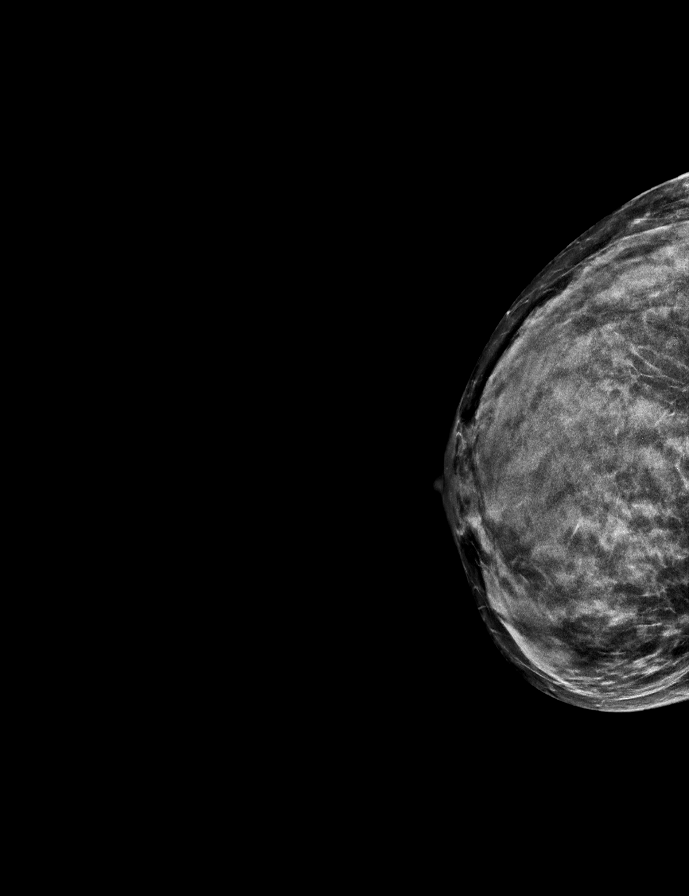

[R MLO synth-2D]
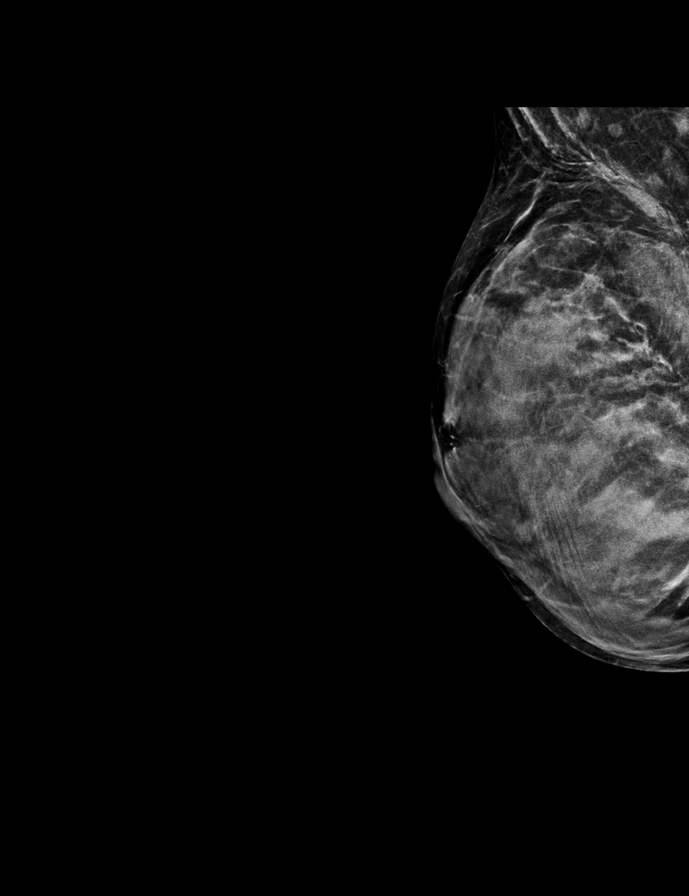

[L CC synth-2D]
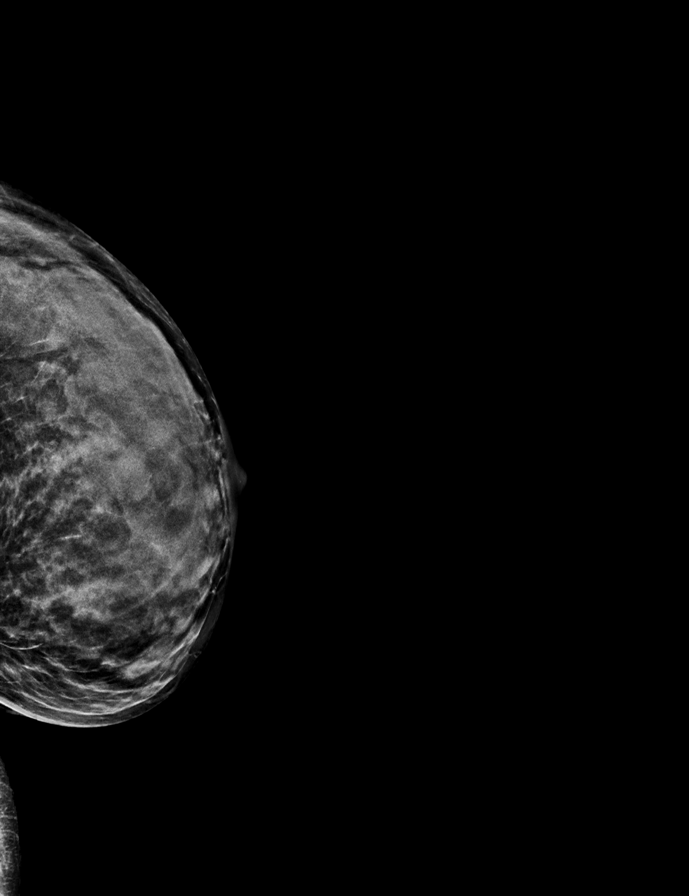

[L MLO tomo · 2 of 44 frames shown]
[frame 15/44]
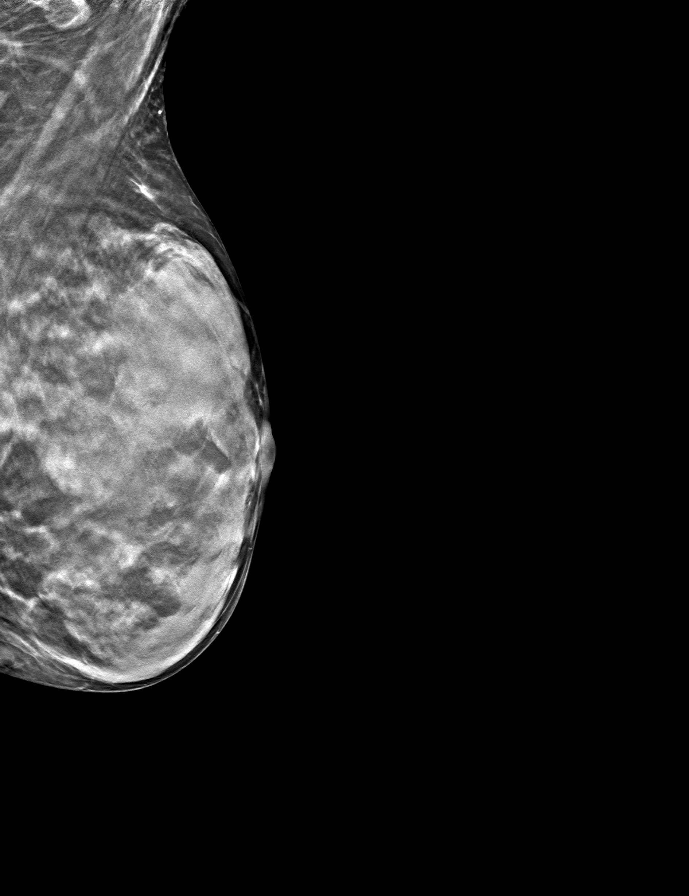
[frame 23/44]
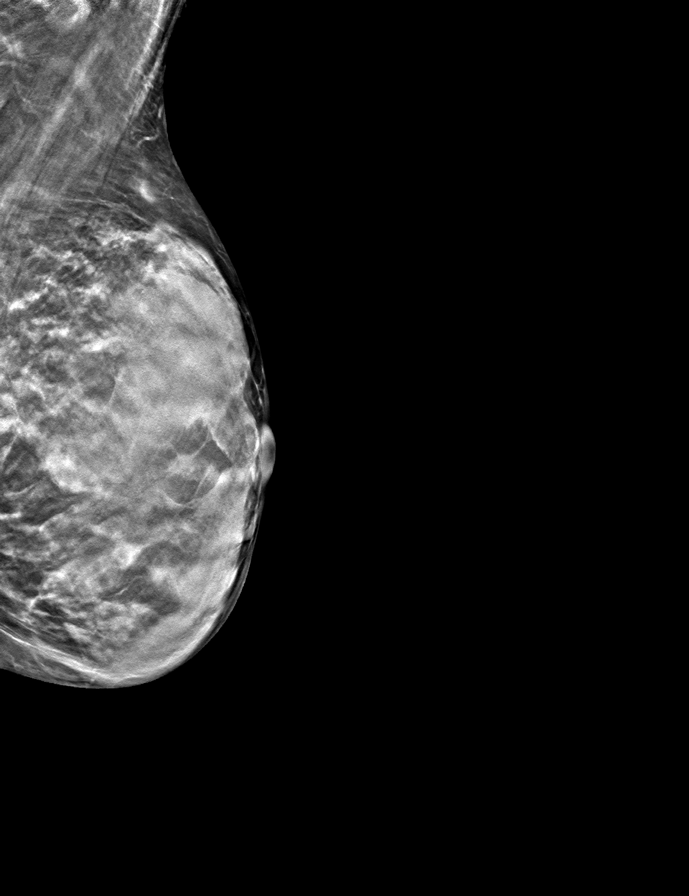

[R MLO tomo · tomo slice 25/50.0]
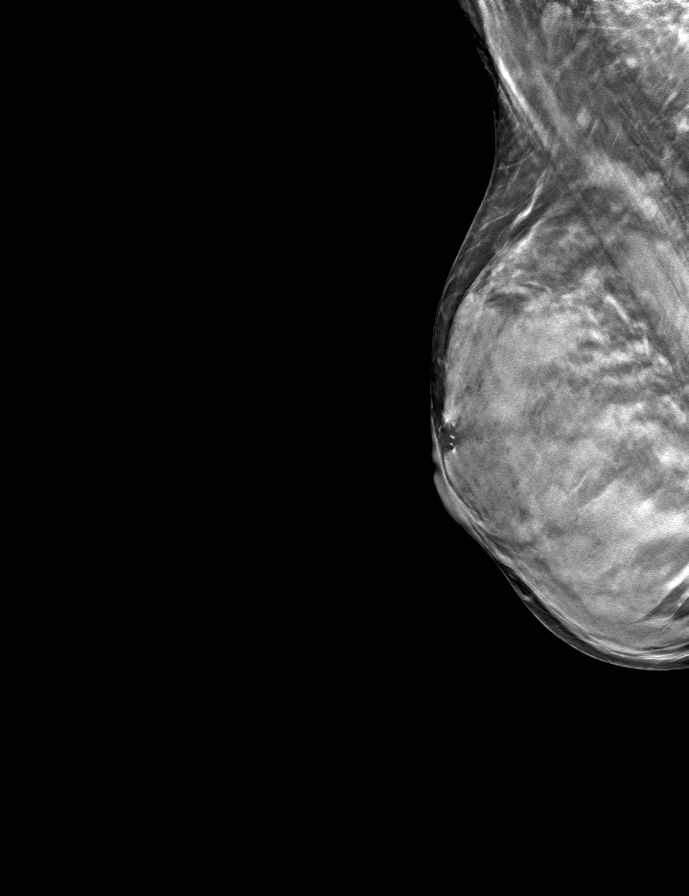

[L CC tomo · tomo slice 23/44.0]
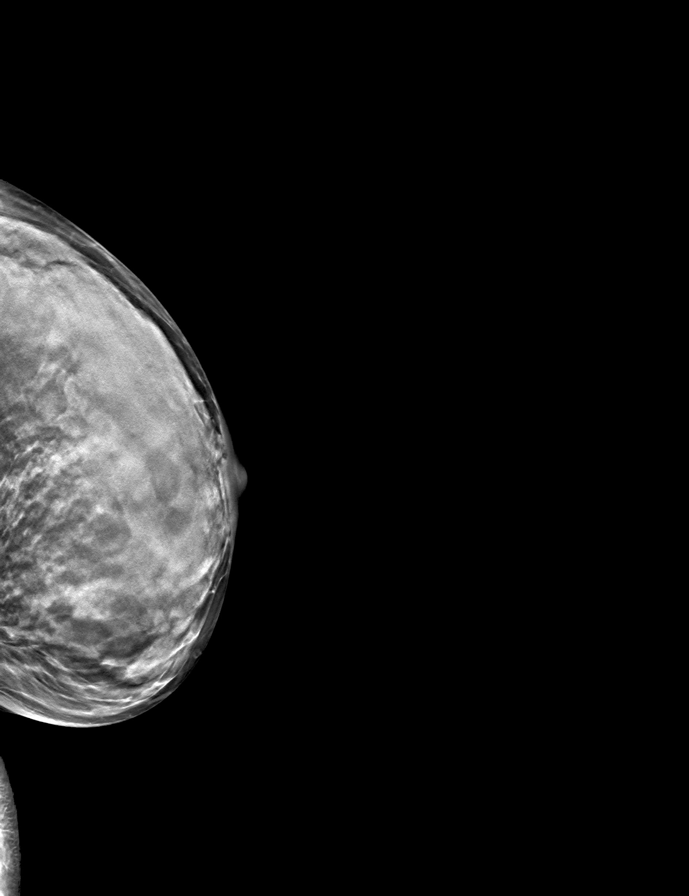

[R CC tomo · tomo slice 26/51.0]
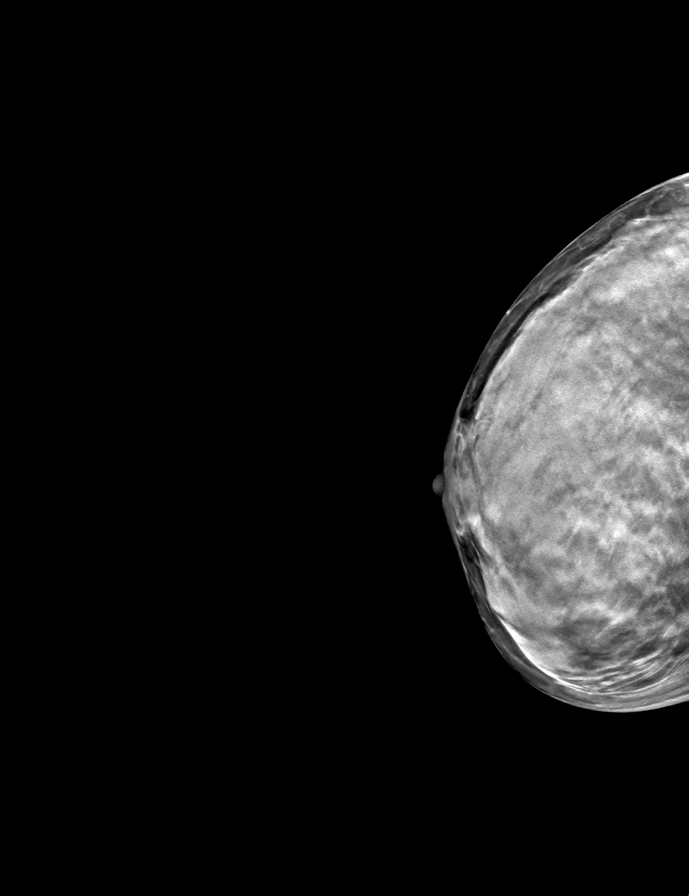

[9 of 24 positions shown; findings below may reference images not displayed]

ACR Breast Density Category d: The breast tissue is extremely dense,
which lowers the sensitivity of mammography.
FINDINGS: There are no findings suspicious for malignancy. Images were
processed with CAD.
IMPRESSION: No mammographic evidence of malignancy. A result letter of this
screening mammogram will be mailed directly to the patient.

RECOMMENDATION:
Screening mammogram in one year. (Code:RA-I-AVB)

BI-RADS CATEGORY  1: Negative.

## 2019-03-14 ENCOUNTER — Other Ambulatory Visit: Payer: Self-pay | Admitting: Internal Medicine

## 2019-03-14 DIAGNOSIS — R5383 Other fatigue: Secondary | ICD-10-CM | POA: Diagnosis not present

## 2019-03-14 DIAGNOSIS — I10 Essential (primary) hypertension: Secondary | ICD-10-CM | POA: Diagnosis not present

## 2019-03-14 DIAGNOSIS — S060X0S Concussion without loss of consciousness, sequela: Secondary | ICD-10-CM | POA: Diagnosis not present

## 2019-03-14 DIAGNOSIS — R42 Dizziness and giddiness: Secondary | ICD-10-CM | POA: Diagnosis not present

## 2019-03-19 ENCOUNTER — Ambulatory Visit (HOSPITAL_COMMUNITY)
Admission: RE | Admit: 2019-03-19 | Discharge: 2019-03-19 | Disposition: A | Discharge status: Home / Self Care | Payer: BC Managed Care – PPO | Source: Ambulatory Visit | Attending: Internal Medicine | Admitting: Internal Medicine

## 2019-03-19 ENCOUNTER — Other Ambulatory Visit: Payer: Self-pay

## 2019-03-19 DIAGNOSIS — R42 Dizziness and giddiness: Secondary | ICD-10-CM | POA: Diagnosis not present

## 2019-03-19 MED ORDER — GADOBUTROL 1 MMOL/ML IV SOLN
6.0000 mL | Freq: Once | INTRAVENOUS | Status: AC | PRN
  Administered 2019-03-19: 18:00:00 6 mL via INTRAVENOUS

## 2019-03-20 DIAGNOSIS — H903 Sensorineural hearing loss, bilateral: Secondary | ICD-10-CM | POA: Diagnosis not present

## 2019-03-20 DIAGNOSIS — R42 Dizziness and giddiness: Secondary | ICD-10-CM | POA: Diagnosis not present

## 2019-03-20 DIAGNOSIS — H9113 Presbycusis, bilateral: Secondary | ICD-10-CM | POA: Diagnosis not present

## 2019-03-20 DIAGNOSIS — H8110 Benign paroxysmal vertigo, unspecified ear: Secondary | ICD-10-CM | POA: Diagnosis not present

## 2019-03-20 LAB — POCT I-STAT CREATININE: Creatinine, Ser: 0.8 mg/dL (ref 0.44–1.00)

## 2019-03-28 ENCOUNTER — Ambulatory Visit: Payer: BC Managed Care – PPO | Admitting: Neurology

## 2019-03-28 ENCOUNTER — Other Ambulatory Visit: Payer: Self-pay

## 2019-03-28 ENCOUNTER — Encounter: Payer: Self-pay | Admitting: Neurology

## 2019-03-28 DIAGNOSIS — R42 Dizziness and giddiness: Secondary | ICD-10-CM | POA: Insufficient documentation

## 2019-03-28 NOTE — Progress Notes (Signed)
Reason for visit: Vertigo  Referring physician: Dr. Lorin Mercy is a 58 y.o. female  History of present illness:  Ms. Howze is a 58 year old white female with a history of a fall that occurred in April 2020.  The patient was trying to put on some slacks and fell over hitting her head on the table.  The patient did not lose consciousness, but she had significant bruising around the left brow and eye.  She claims that before the fall she was having some slight problems with dizziness if she would stand up too quickly, but after the fall she has had dizziness that has been more positional.  She will have true vertigo if she looks up and then looks down, if she lies down in bed or rolls over in bed she will get vertigo lasting a few seconds to up to a minute.  When she sits up from bed she will also get some vertigo.  She has been bothered with the vertigo somewhat when driving but not to a significant degree.  She has noted some slight gait instability even when she is not feeling dizzy.  She has had lifelong history of problems with motion sickness and difficulty even going up escalators because of this.  She reports no headaches at this point with exception she occasionally have some sharp pain in the left occipital area.  She denies neck pain.  She has no numbness or weakness of the extremities and she denies issues controlling the bowels or the bladder.  She reports no double vision, slurred speech, or loss of vision.  She has been seen by Dr. Polly Cobia, she was also seen through cardiology and underwent what sounds like a Dix-Hallpike maneuver which resulted in severe vertigo.  The patient will take meclizine on occasion but this causes too much drowsiness for her to take it on a regular basis.  She has undergone MRI of the brain that does not show any acute changes, minimal white matter changes were noted.  She is sent to this office for an evaluation.  Past Medical History:   Diagnosis Date  . Concussion   . Dizziness   . GERD (gastroesophageal reflux disease)   . Hypertension   . Thyroid disease     Past Surgical History:  Procedure Laterality Date  . CESAREAN SECTION N/A 1998  . DIAGNOSTIC LAPAROSCOPY N/A    to eval endometriosis    Family History  Problem Relation Age of Onset  . Breast cancer Mother   . Rheum arthritis Mother   . Cancer Brother 50       mult myeloma  . Cancer Father        gum  . Heart failure Father   . Kidney disease Father     Social history:  reports that she has been smoking cigarettes. She has a 5.00 pack-year smoking history. She has never used smokeless tobacco. She reports current alcohol use of about 7.0 standard drinks of alcohol per week. She reports that she does not use drugs.  Medications:  Prior to Admission medications   Medication Sig Start Date End Date Taking? Authorizing Provider  ALPRAZolam Duanne Moron) 0.5 MG tablet Take 0.5 mg by mouth 2 (two) times daily as needed for sleep or anxiety.   Yes [provider]  levothyroxine (SYNTHROID) 88 MCG tablet Take 88 mcg by mouth daily before breakfast.   Yes [provider]  losartan (COZAAR) 50 MG tablet Take by mouth.  Yes [provider]  meclizine (ANTIVERT) 25 MG tablet Take 25 mg by mouth 3 (three) times daily as needed for dizziness.   Yes [provider]  meloxicam (MOBIC) 15 MG tablet Take 15 mg by mouth daily.   Yes [provider]  metoprolol tartrate (LOPRESSOR) 25 MG tablet Take 12.5 mg by mouth 2 (two) times daily.   Yes [provider]  Vitamin D, Ergocalciferol, (DRISDOL) 50000 units CAPS capsule Take 50,000 Units by mouth every 7 (seven) days.   Yes [provider]      Allergies  Allergen Reactions  . Codeine Rash    halucinations    ROS:  Out of a complete 14 system review of symptoms, the patient complains only of the following symptoms, and all other reviewed systems are  negative.  Vertigo Mild gait instability  Blood pressure (!) 166/97, pulse 97, temperature (!) 96.1 F (35.6 C), temperature source Temporal, height 5\' 2"  (1.575 m), weight 106 lb (48.1 kg).  Physical Exam  General: The patient is alert and cooperative at the time of the examination.  Eyes: Pupils are equal, round, and reactive to light. Discs are flat bilaterally.  Neck: The neck is supple, no carotid bruits are noted.  Respiratory: The respiratory examination is clear.  Cardiovascular: The cardiovascular examination reveals a regular rate and rhythm, no obvious murmurs or rubs are noted.  Skin: Extremities are without significant edema.  Neurologic Exam  Mental status: The patient is alert and oriented x 3 at the time of the examination. The patient has apparent normal recent and remote memory, with an apparently normal attention span and concentration ability.  Cranial nerves: Facial symmetry is present. There is good sensation of the face to pinprick and soft touch bilaterally. The strength of the facial muscles and the muscles to head turning and shoulder shrug are normal bilaterally. Speech is well enunciated, no aphasia or dysarthria is noted. Extraocular movements are full. Visual fields are full. The tongue is midline, and the patient has symmetric elevation of the soft palate. No obvious hearing deficits are noted.  Motor: The motor testing reveals 5 over 5 strength of all 4 extremities. Good symmetric motor tone is noted throughout.  Sensory: Sensory testing is intact to pinprick, soft touch, vibration sensation, and position sense on all 4 extremities. No evidence of extinction is noted.  Coordination: Cerebellar testing reveals good finger-nose-finger and heel-to-shin bilaterally.  The Nyan-Barrany procedure was performed, the patient reported minimal dizziness, there was some slight horizontal and rotatory nystagmus seen at end gaze bilaterally.  Gait and station: Gait  is normal. Tandem gait is normal. Romberg is negative. No drift is seen.  Reflexes: Deep tendon reflexes are symmetric and normal bilaterally. Toes are downgoing bilaterally.   MRI brain 03/19/19:  IMPRESSION: No intracranial mass or abnormal enhancement. Mild burden of small nonspecific foci of T2 hyperintensity in the cerebral white matter likely reflecting chronic microvascular ischemic changes or other etiologies of gliosis/demyelination.  * MRI scan images were reviewed online. I agree with the written report.     Assessment/Plan:  1.  Postconcussive vertigo  The patient has a relatively unremarkable examination today.  She is still having some symptoms that are usually associated with rapid head movements or changes in head position.  We will send her for vestibular rehabilitation.  She will follow-up here in 3 to 4 months.   Jill Alexanders MD 03/28/2019 1:32 PM  [facility] [address] [place],  [place] [address]  Phone [phone] Fax [phone]

## 2019-04-16 DIAGNOSIS — Z20828 Contact with and (suspected) exposure to other viral communicable diseases: Secondary | ICD-10-CM | POA: Diagnosis not present

## 2019-04-30 ENCOUNTER — Ambulatory Visit: Payer: BC Managed Care – PPO | Attending: Physical Therapy | Admitting: Physical Therapy

## 2019-05-22 DIAGNOSIS — Z01419 Encounter for gynecological examination (general) (routine) without abnormal findings: Secondary | ICD-10-CM | POA: Diagnosis not present

## 2019-05-22 DIAGNOSIS — E079 Disorder of thyroid, unspecified: Secondary | ICD-10-CM | POA: Insufficient documentation

## 2019-05-22 DIAGNOSIS — Z681 Body mass index (BMI) 19 or less, adult: Secondary | ICD-10-CM | POA: Diagnosis not present

## 2019-05-22 DIAGNOSIS — K589 Irritable bowel syndrome without diarrhea: Secondary | ICD-10-CM | POA: Insufficient documentation

## 2019-05-29 ENCOUNTER — Ambulatory Visit: Payer: BC Managed Care – PPO | Admitting: Diagnostic Neuroimaging

## 2019-06-21 DIAGNOSIS — M810 Age-related osteoporosis without current pathological fracture: Secondary | ICD-10-CM | POA: Diagnosis not present

## 2019-06-21 DIAGNOSIS — R1032 Left lower quadrant pain: Secondary | ICD-10-CM | POA: Diagnosis not present

## 2019-06-21 DIAGNOSIS — Z803 Family history of malignant neoplasm of breast: Secondary | ICD-10-CM | POA: Diagnosis not present

## 2019-06-21 DIAGNOSIS — Z1231 Encounter for screening mammogram for malignant neoplasm of breast: Secondary | ICD-10-CM | POA: Diagnosis not present

## 2019-06-21 DIAGNOSIS — Z13820 Encounter for screening for osteoporosis: Secondary | ICD-10-CM | POA: Diagnosis not present

## 2019-06-26 ENCOUNTER — Other Ambulatory Visit: Payer: Self-pay | Admitting: Obstetrics and Gynecology

## 2019-06-26 DIAGNOSIS — R928 Other abnormal and inconclusive findings on diagnostic imaging of breast: Secondary | ICD-10-CM

## 2019-07-03 ENCOUNTER — Ambulatory Visit: Payer: BC Managed Care – PPO

## 2019-07-03 ENCOUNTER — Ambulatory Visit
Admission: RE | Admit: 2019-07-03 | Discharge: 2019-07-03 | Disposition: A | Discharge status: Home / Self Care | Payer: BC Managed Care – PPO | Source: Ambulatory Visit | Attending: Obstetrics and Gynecology | Admitting: Obstetrics and Gynecology

## 2019-07-03 ENCOUNTER — Other Ambulatory Visit: Payer: Self-pay

## 2019-07-03 DIAGNOSIS — R928 Other abnormal and inconclusive findings on diagnostic imaging of breast: Secondary | ICD-10-CM

## 2019-07-03 DIAGNOSIS — R922 Inconclusive mammogram: Secondary | ICD-10-CM | POA: Diagnosis not present

## 2019-07-06 ENCOUNTER — Other Ambulatory Visit: Payer: BC Managed Care – PPO

## 2019-08-16 ENCOUNTER — Telehealth: Payer: Self-pay

## 2019-08-16 NOTE — Telephone Encounter (Signed)
[place] [facility] faxed request for medical records from [facility] 08/16/19  [facility]

## 2019-08-17 ENCOUNTER — Other Ambulatory Visit: Payer: Self-pay | Admitting: Obstetrics and Gynecology

## 2019-08-17 DIAGNOSIS — Z9189 Other specified personal risk factors, not elsewhere classified: Secondary | ICD-10-CM

## 2019-08-27 ENCOUNTER — Telehealth: Payer: Self-pay | Admitting: Internal Medicine

## 2019-08-27 ENCOUNTER — Ambulatory Visit: Payer: BC Managed Care – PPO | Admitting: Neurology

## 2019-08-27 NOTE — Progress Notes (Deleted)
PATIENT: [name] [name] DOB: [birth year]  REASON FOR VISIT: follow up HISTORY FROM: patient  HISTORY OF PRESENT ILLNESS: Today 08/27/19  [name] [name] 59 year old female with history of a fall in April 2020, hitting her head on the table.  After the fall, has had problems with dizziness.  When last seen she was sent to vestibular rehab.  HISTORY  03/28/2019 Dr. Jannifer Franklin: [name] [name] is a 59 year old white female with a history of a fall that occurred in April 2020.  The patient was trying to put on some slacks and fell over hitting her head on the table.  The patient did not lose consciousness, but she had significant bruising around the left brow and eye.  She claims that before the fall she was having some slight problems with dizziness if she would stand up too quickly, but after the fall she has had dizziness that has been more positional.  She will have true vertigo if she looks up and then looks down, if she lies down in bed or rolls over in bed she will get vertigo lasting a few seconds to up to a minute.  When she sits up from bed she will also get some vertigo.  She has been bothered with the vertigo somewhat when driving but not to a significant degree.  She has noted some slight gait instability even when she is not feeling dizzy.  She has had lifelong history of problems with motion sickness and difficulty even going up escalators because of this.  She reports no headaches at this point with exception she occasionally have some sharp pain in the left occipital area.  She denies neck pain.  She has no numbness or weakness of the extremities and she denies issues controlling the bowels or the bladder.  She reports no double vision, slurred speech, or loss of vision.  She has been seen by Dr. Polly Cobia, she was also seen through cardiology and underwent what sounds like a Dix-Hallpike maneuver which resulted in severe vertigo.  The patient will take meclizine on occasion but this causes too  much drowsiness for her to take it on a regular basis.  She has undergone MRI of the brain that does not show any acute changes, minimal white matter changes were noted.  She is sent to this office for an evaluation.  REVIEW OF SYSTEMS: Out of a complete 14 system review of symptoms, the patient complains only of the following symptoms, and all other reviewed systems are negative.  ALLERGIES: Allergies  Allergen Reactions  . Codeine Rash    halucinations    HOME MEDICATIONS: Outpatient Medications Prior to Visit  Medication Sig Dispense Refill  . ALPRAZolam (XANAX) 0.5 MG tablet Take 0.5 mg by mouth 2 (two) times daily as needed for sleep or anxiety.    Marland Kitchen levothyroxine (SYNTHROID) 88 MCG tablet Take 88 mcg by mouth daily before breakfast.    . losartan (COZAAR) 50 MG tablet Take by mouth.    . meclizine (ANTIVERT) 25 MG tablet Take 25 mg by mouth 3 (three) times daily as needed for dizziness.    . meloxicam (MOBIC) 15 MG tablet Take 15 mg by mouth daily.    . metoprolol tartrate (LOPRESSOR) 25 MG tablet Take 12.5 mg by mouth 2 (two) times daily.    . Vitamin D, Ergocalciferol, (DRISDOL) 50000 units CAPS capsule Take 50,000 Units by mouth every 7 (seven) days.     No facility-administered medications prior to visit.    PAST  MEDICAL HISTORY: Past Medical History:  Diagnosis Date  . Concussion   . Dizziness   . GERD (gastroesophageal reflux disease)   . Hypertension   . Thyroid disease     PAST SURGICAL HISTORY: Past Surgical History:  Procedure Laterality Date  . CESAREAN SECTION N/A 1998  . DIAGNOSTIC LAPAROSCOPY N/A    to eval endometriosis    FAMILY HISTORY: Family History  Problem Relation Age of Onset  . Breast cancer Mother   . Rheum arthritis Mother   . Cancer Brother 50       mult myeloma  . Cancer Father        gum  . Heart failure Father   . Kidney disease Father     SOCIAL HISTORY: Social History   Socioeconomic History  . Marital status: Divorced      Spouse name: Not on file  . Number of children: Not on file  . Years of education: Not on file  . Highest education level: Bachelor's degree (e.g., BA, AB, BS)  Occupational History  . Not on file  Tobacco Use  . Smoking status: Current Every Day Smoker    Packs/day: 0.25    Years: 20.00    Pack years: 5.00    Types: Cigarettes  . Smokeless tobacco: Never Used  Substance and Sexual Activity  . Alcohol use: Yes    Alcohol/week: 7.0 standard drinks    Types: 7 Glasses of wine per week  . Drug use: No  . Sexual activity: Yes  Other Topics Concern  . Not on file  Social History Narrative   Right handed    Caffeine~ maybe 1 cup per day   Lives at home husband    Social Determinants of Health   Financial Resource Strain:   . Difficulty of Paying Living Expenses:   Food Insecurity:   . Worried About Charity fundraiser in the Last Year:   . Arboriculturist in the Last Year:   Transportation Needs:   . Film/video editor (Medical):   Marland Kitchen Lack of Transportation (Non-Medical):   Physical Activity:   . Days of Exercise per Week:   . Minutes of Exercise per Session:   Stress:   . Feeling of Stress :   Social Connections:   . Frequency of Communication with Friends and Family:   . Frequency of Social Gatherings with Friends and Family:   . Attends Religious Services:   . Active Member of Clubs or Organizations:   . Attends Archivist Meetings:   Marland Kitchen Marital Status:   Intimate Partner Violence:   . Fear of Current or Ex-Partner:   . Emotionally Abused:   Marland Kitchen Physically Abused:   . Sexually Abused:       PHYSICAL EXAM  There were no vitals filed for this visit. There is no height or weight on file to calculate BMI.  Generalized: Well developed, in no acute distress   Neurological examination  Mentation: Alert oriented to time, place, history taking. Follows all commands speech and language fluent Cranial nerve II-XII: Pupils were equal round reactive to  light. Extraocular movements were full, visual field were full on confrontational test. Facial sensation and strength were normal. Uvula tongue midline. Head turning and shoulder shrug  were normal and symmetric. Motor: The motor testing reveals 5 over 5 strength of all 4 extremities. Good symmetric motor tone is noted throughout.  Sensory: Sensory testing is intact to soft touch on all 4 extremities. No  evidence of extinction is noted.  Coordination: Cerebellar testing reveals good finger-nose-finger and heel-to-shin bilaterally.  Gait and station: Gait is normal. Tandem gait is normal. Romberg is negative. No drift is seen.  Reflexes: Deep tendon reflexes are symmetric and normal bilaterally.   DIAGNOSTIC DATA (LABS, IMAGING, TESTING) - I reviewed patient records, labs, notes, testing and imaging myself where available.  Lab Results  Component Value Date   WBC 9.4 01/23/2013   HGB 13.5 01/23/2013   HCT 39.1 01/23/2013   MCV 101.0 (H) 01/23/2013   PLT 289 01/23/2013      Component Value Date/Time   NA 136 01/23/2013 0105   K 3.5 01/23/2013 0105   CL 98 01/23/2013 0105   CO2 28 01/23/2013 0105   GLUCOSE 101 (H) 01/23/2013 0105   BUN 11 01/23/2013 0105   CREATININE 0.80 03/19/2019 1720   CALCIUM 9.2 01/23/2013 0105   PROT 6.7 01/23/2013 0130   ALBUMIN 3.6 01/23/2013 0130   AST 21 01/23/2013 0130   ALT 16 01/23/2013 0130   ALKPHOS 63 01/23/2013 0130   BILITOT 0.2 (L) 01/23/2013 0130   GFRNONAA >90 01/23/2013 0105   GFRAA >90 01/23/2013 0105   No results found for: CHOL, HDL, LDLCALC, LDLDIRECT, TRIG, CHOLHDL No results found for: HGBA1C No results found for: VITAMINB12 No results found for: TSH    ASSESSMENT AND PLAN 59 y.o. year old female  has a past medical history of Concussion, Dizziness, GERD (gastroesophageal reflux disease), Hypertension, and Thyroid disease. here with ***   I spent 15 minutes with the patient. 50% of this time was spent   Butler Denmark, [place],  DNP 08/27/2019, 5:40 AM [facility] [address] [place], [place] [address] [phone]

## 2019-08-27 NOTE — Telephone Encounter (Signed)
ROI faxed to [facility]/Dr. Benson Norway 4/12/21fbg

## 2019-08-28 ENCOUNTER — Encounter: Payer: Self-pay | Admitting: Neurology

## 2019-09-10 ENCOUNTER — Other Ambulatory Visit: Payer: BC Managed Care – PPO

## 2019-10-08 ENCOUNTER — Ambulatory Visit
Admission: RE | Admit: 2019-10-08 | Discharge: 2019-10-08 | Disposition: A | Discharge status: Home / Self Care | Payer: BC Managed Care – PPO | Source: Ambulatory Visit | Attending: Obstetrics and Gynecology | Admitting: Obstetrics and Gynecology

## 2019-10-08 ENCOUNTER — Other Ambulatory Visit: Payer: Self-pay

## 2019-10-08 DIAGNOSIS — Z9189 Other specified personal risk factors, not elsewhere classified: Secondary | ICD-10-CM

## 2019-10-08 DIAGNOSIS — Z803 Family history of malignant neoplasm of breast: Secondary | ICD-10-CM | POA: Diagnosis not present

## 2019-10-08 MED ORDER — GADOBUTROL 1 MMOL/ML IV SOLN
5.0000 mL | Freq: Once | INTRAVENOUS | Status: AC | PRN
  Administered 2019-10-08: 5 mL via INTRAVENOUS

## 2019-10-30 ENCOUNTER — Encounter: Payer: Self-pay | Admitting: Nurse Practitioner

## 2019-10-30 DIAGNOSIS — K449 Diaphragmatic hernia without obstruction or gangrene: Secondary | ICD-10-CM | POA: Diagnosis not present

## 2019-10-30 DIAGNOSIS — F17200 Nicotine dependence, unspecified, uncomplicated: Secondary | ICD-10-CM | POA: Diagnosis not present

## 2019-10-30 DIAGNOSIS — K5900 Constipation, unspecified: Secondary | ICD-10-CM | POA: Diagnosis not present

## 2019-10-30 DIAGNOSIS — R140 Abdominal distension (gaseous): Secondary | ICD-10-CM | POA: Diagnosis not present

## 2019-11-01 ENCOUNTER — Telehealth: Payer: Self-pay | Admitting: Internal Medicine

## 2019-11-01 NOTE — Telephone Encounter (Signed)
Dr. Hilarie Fredrickson, this patient requested to transfer care to you from Dr. Benson Norway because her husband is your patient.  She stated that she has signed a medical release form and that records were sent.  Do you recall ever receiving her records?

## 2019-11-05 NOTE — Telephone Encounter (Signed)
I see that she has an appt with JZ next month I do not specifically recall record review but okay for this appointment and to transfer care to me

## 2019-11-09 ENCOUNTER — Ambulatory Visit: Payer: BC Managed Care – PPO | Admitting: Nurse Practitioner

## 2019-11-09 ENCOUNTER — Other Ambulatory Visit (INDEPENDENT_AMBULATORY_CARE_PROVIDER_SITE_OTHER): Payer: BC Managed Care – PPO

## 2019-11-09 ENCOUNTER — Encounter: Payer: Self-pay | Admitting: Nurse Practitioner

## 2019-11-09 VITALS — BP 138/84 | HR 80 | Ht 62.0 in | Wt 102.6 lb

## 2019-11-09 DIAGNOSIS — K219 Gastro-esophageal reflux disease without esophagitis: Secondary | ICD-10-CM | POA: Diagnosis not present

## 2019-11-09 DIAGNOSIS — R140 Abdominal distension (gaseous): Secondary | ICD-10-CM

## 2019-11-09 DIAGNOSIS — Z86010 Personal history of colonic polyps: Secondary | ICD-10-CM

## 2019-11-09 LAB — COMPREHENSIVE METABOLIC PANEL
ALT: 13 U/L (ref 0–35)
AST: 19 U/L (ref 0–37)
Albumin: 5 g/dL (ref 3.5–5.2)
Alkaline Phosphatase: 69 U/L (ref 39–117)
BUN: 14 mg/dL (ref 6–23)
CO2: 29 mEq/L (ref 19–32)
Calcium: 9.7 mg/dL (ref 8.4–10.5)
Chloride: 100 mEq/L (ref 96–112)
Creatinine, Ser: 0.68 mg/dL (ref 0.40–1.20)
GFR: 88.25 mL/min (ref >60.00)
Glucose, Bld: 80 mg/dL (ref 70–99)
Potassium: 4.4 mEq/L (ref 3.5–5.1)
Sodium: 139 mEq/L (ref 135–145)
Total Bilirubin: 0.6 mg/dL (ref 0.2–1.2)
Total Protein: 7.7 g/dL (ref 6.0–8.3)

## 2019-11-09 LAB — CBC WITH DIFFERENTIAL/PLATELET
Basophils Absolute: 0.1 10*3/uL (ref 0.0–0.1)
Basophils Relative: 1.1 % (ref 0.0–3.0)
Eosinophils Absolute: 0.1 10*3/uL (ref 0.0–0.7)
Eosinophils Relative: 1.2 % (ref 0.0–5.0)
HCT: 42.4 % (ref 36.0–46.0)
Hemoglobin: 14.5 g/dL (ref 12.0–15.0)
Lymphocytes Relative: 25 % (ref 12.0–46.0)
Lymphs Abs: 2.1 10*3/uL (ref 0.7–4.0)
MCHC: 34.1 g/dL (ref 30.0–36.0)
MCV: 103.2 fl — ABNORMAL HIGH (ref 78.0–100.0)
Monocytes Absolute: 0.6 10*3/uL (ref 0.1–1.0)
Monocytes Relative: 7.5 % (ref 3.0–12.0)
Neutro Abs: 5.4 10*3/uL (ref 1.4–7.7)
Neutrophils Relative %: 65.2 % (ref 43.0–77.0)
Platelets: 334 10*3/uL (ref 150.0–400.0)
RBC: 4.11 Mil/uL (ref 3.87–5.11)
RDW: 13.1 % (ref 11.5–15.5)
WBC: 8.2 10*3/uL (ref 4.0–10.5)

## 2019-11-09 LAB — C-REACTIVE PROTEIN: CRP: <1 mg/dL (ref 0.5–20.0)

## 2019-11-09 LAB — IGA: IgA: 258 mg/dL (ref 68–378)

## 2019-11-09 MED ORDER — NA SULFATE-K SULFATE-MG SULF 17.5-3.13-1.6 GM/177ML PO SOLN: 1.0000 | Freq: Once | ORAL | 0 refills | Status: DC

## 2019-11-09 MED ORDER — SUTAB 1479-225-188 MG PO TABS: 1.0000 | ORAL_TABLET | Freq: Once | ORAL | 0 refills | Status: AC

## 2019-11-09 MED ORDER — OMEPRAZOLE 40 MG PO CPDR: 40.0000 mg | DELAYED_RELEASE_CAPSULE | Freq: Every day | ORAL | 1 refills | Status: DC

## 2019-11-09 NOTE — Patient Instructions (Addendum)
If you are age 60 or older, your body mass index should be between 23-30. Your Body mass index is 18.77 kg/m. If this is out of the aforementioned range listed, please consider follow up with your Primary Care Provider.  If you are age 61 or younger, your body mass index should be between 19-25. Your Body mass index is 18.77 kg/m. If this is out of the aformentioned range listed, please consider follow up with your Primary Care Provider.   Enclosed is a Paediatric nurse bacteria probiotic once daily  We have sent the following medications to your pharmacy for you to pick up at your convenience: Sutab Hyoscyamine omeprazole   Due to recent changes in healthcare laws, you may see the results of your imaging and laboratory studies on MyChart before your provider has had a chance to review them.  We understand that in some cases there may be results that are confusing or concerning to you. Not all laboratory results come back in the same time frame and the provider may be waiting for multiple results in order to interpret others.  Please give [place] 48 hours in order for your provider to thoroughly review all the results before contacting the office for clarification of your results.

## 2019-11-09 NOTE — Progress Notes (Signed)
11/09/2019 [name] [name] [id number] [birth year]   CHIEF COMPLAINT:  Abdominal pain/bloat  HISTORY OF PRESENT ILLNESS: [name] [name] is a 59 year old female with a past medical history of hypertension, hypothyroidism, vertigo, osteoporosis on Fosomax and GERD. She presents today for further evaluation for abdominal bloat, generalized abdominal discomfort and occasional indigestion since April 2021. Her husband is a patient of Dr. Vena Rua and she wishes to transition her GI care to Dr. Hilarie Fredrickson. She has occasional constipation. She was passing small hard stools so she took Miralax for 3 days 6/16 - 6/19 which resulted in diarrhea.  She is taking Gax X once or twice daily. She underwent a colonoscopy by Dr. Benson Norway in 2015 which she she reported identified one colon polyp which was removed. She was advised to repeat a colonoscopy in 5 years. She last took an antibiotic 3 to 4 months ago for bronchitis. She puts milk in her coffee, rarely drinks a glass of milk. No other dairy products. She saw her gynecologist recently and an intravaginal pelvic sonogram was reported as normal. She complains of having worse reflux with heartburn, voice hoarseness and a dry cough. She reported having an EGD 5 years ago, no significant findings. She wishes to schedule an EGD at the time of her colonoscopy. No weight loss, her typical weight is 102 - 105lbs. She rarely takes Meloxicam 57m daily QHS. Paternal grandmother with history of stomach cancer. No family history of colon cancer.   Past Medical History:  Diagnosis Date  . Concussion   . Dizziness   . GERD (gastroesophageal reflux disease)   . Hypertension   . Thyroid disease    Past Surgical History:  Procedure Laterality Date  . CESAREAN SECTION N/A 1998  . DIAGNOSTIC LAPAROSCOPY N/A    to eval endometriosis   Social History: She smokes cigarettes since age [age] quite x 5 years, 1/2ppd. She drinks 2 glasses of wine every every evening. No drug use.    Family History: Mother age 3880with history of breast cancer.  Father died age 388with history of oral cancer, CHF and kidney disease. Paternal grandmother with history of stomach cancer. Brother diagnosed with multiple myeloma age 60   Allergies  Allergen Reactions  . Codeine Rash    halucinations      Outpatient Encounter Medications as of 11/09/2019  Medication Sig  . ALPRAZolam (XANAX) 0.5 MG tablet Take 0.5 mg by mouth 2 (two) times daily as needed for sleep or anxiety.  .Marland Kitchenlevothyroxine (SYNTHROID) 88 MCG tablet Take 88 mcg by mouth daily before breakfast.  . losartan (COZAAR) 50 MG tablet Take by mouth.  . meclizine (ANTIVERT) 25 MG tablet Take 25 mg by mouth 3 (three) times daily as needed for dizziness.  . meloxicam (MOBIC) 15 MG tablet Take 15 mg by mouth daily.  . metoprolol tartrate (LOPRESSOR) 25 MG tablet Take 50 mg by mouth daily.   . Simethicone (GAS-X PO) Take by mouth in the morning and at bedtime.  . Vitamin D, Ergocalciferol, (DRISDOL) 50000 units CAPS capsule Take 50,000 Units by mouth every 7 (seven) days.  .Marland Kitchenalendronate (FOSAMAX) 70 MG tablet Take 70 mg by mouth once a week.  . hyoscyamine (LEVSIN SL) 0.125 MG SL tablet Place 0.125 mg under the tongue every 6 (six) hours as needed. (Patient not taking: Reported on 11/09/2019)   No facility-administered encounter medications on file as of 11/09/2019.     REVIEW OF SYSTEMS: All other systems reviewed  and negative except where noted in the History of Present Illness.   PHYSICAL EXAM: BP 138/84   Pulse 80   Ht '5\' 2"'  (1.575 m)   Wt 102 lb 9.6 oz (46.5 kg)   SpO2 97%   BMI 18.77 kg/m  General: Petite 59 year old female in no acute distress. Head: Normocephalic and atraumatic. Eyes:  Sclerae non-icteric, conjunctive pink. Ears: Normal auditory acuity. Mouth: Dentition intact. No ulcers or lesions.  Neck: Supple, no lymphadenopathy or thyromegaly.  Lungs: Clear bilaterally to auscultation without wheezes,  crackles or rhonchi. Heart: Regular rate and rhythm. No murmur, rub or gallop appreciated.  Abdomen: Soft, nontender, non distended. No masses. No hepatosplenomegaly. Normoactive bowel sounds x 4 quadrants.  Rectal: Deferred. Musculoskeletal: Symmetrical with no gross deformities. Skin: Warm and dry. No rash or lesions on visible extremities. Extremities: No edema. Neurological: Alert oriented x 4, no focal deficits.  Psychological:  Alert and cooperative. Normal mood and affect.  ASSESSMENT AND PLAN:  65. 59 year old female with abdominal bloat, generalized abdominal discomfort and constipation  -FODMAP handout -Phillip's bacteria probiotic  -Discussed 1/2 dose of Miralax PRN -Hyoscyamine 0.16m one tab SL Q 8 hrs PRN -CBC, CMET, CRP, IgA, tTG -Consider SIBO testing if no improvement    2. GERD symptoms -Omeprazole 437mdaily -EGD benefits and risks discussed including risk with sedation, risk of bleeding, perforation and infection   3. History of colon polyps -Request copy of colonoscopy procedure and biopsy report from Dr. HuBenson NorwayColonoscopy benefits and risks discussed including risk with sedation, risk of bleeding, perforation and infection      CC:  PaLeanna BattlesMD

## 2019-11-10 DIAGNOSIS — R140 Abdominal distension (gaseous): Secondary | ICD-10-CM | POA: Insufficient documentation

## 2019-11-10 DIAGNOSIS — K219 Gastro-esophageal reflux disease without esophagitis: Secondary | ICD-10-CM | POA: Insufficient documentation

## 2019-11-12 LAB — TISSUE TRANSGLUTAMINASE, IGA: (tTG) Ab, IgA: 1 U/mL

## 2019-11-23 ENCOUNTER — Encounter: Payer: Self-pay | Admitting: Internal Medicine

## 2019-11-23 DIAGNOSIS — Z0000 Encounter for general adult medical examination without abnormal findings: Secondary | ICD-10-CM | POA: Diagnosis not present

## 2019-11-23 DIAGNOSIS — E038 Other specified hypothyroidism: Secondary | ICD-10-CM | POA: Diagnosis not present

## 2019-11-23 DIAGNOSIS — E7849 Other hyperlipidemia: Secondary | ICD-10-CM | POA: Diagnosis not present

## 2019-11-23 DIAGNOSIS — I10 Essential (primary) hypertension: Secondary | ICD-10-CM | POA: Diagnosis not present

## 2019-11-26 DIAGNOSIS — F419 Anxiety disorder, unspecified: Secondary | ICD-10-CM | POA: Diagnosis not present

## 2019-11-26 DIAGNOSIS — K5900 Constipation, unspecified: Secondary | ICD-10-CM | POA: Diagnosis not present

## 2019-11-26 DIAGNOSIS — Z0000 Encounter for general adult medical examination without abnormal findings: Secondary | ICD-10-CM | POA: Diagnosis not present

## 2019-11-26 DIAGNOSIS — R82998 Other abnormal findings in urine: Secondary | ICD-10-CM | POA: Diagnosis not present

## 2019-11-26 DIAGNOSIS — Z1331 Encounter for screening for depression: Secondary | ICD-10-CM | POA: Diagnosis not present

## 2019-11-26 DIAGNOSIS — F329 Major depressive disorder, single episode, unspecified: Secondary | ICD-10-CM | POA: Diagnosis not present

## 2019-11-26 DIAGNOSIS — F17200 Nicotine dependence, unspecified, uncomplicated: Secondary | ICD-10-CM | POA: Diagnosis not present

## 2019-11-26 DIAGNOSIS — Z1212 Encounter for screening for malignant neoplasm of rectum: Secondary | ICD-10-CM | POA: Diagnosis not present

## 2019-11-26 DIAGNOSIS — I10 Essential (primary) hypertension: Secondary | ICD-10-CM | POA: Diagnosis not present

## 2019-11-27 NOTE — Progress Notes (Signed)
Addendum: Reviewed and agree with assessment and management plan. Luay Balding M, MD  

## 2019-11-29 ENCOUNTER — Encounter: Payer: Self-pay | Admitting: Internal Medicine

## 2019-11-29 ENCOUNTER — Other Ambulatory Visit: Payer: Self-pay | Admitting: Internal Medicine

## 2019-11-29 ENCOUNTER — Ambulatory Visit (AMBULATORY_SURGERY_CENTER): Payer: BC Managed Care – PPO | Admitting: Internal Medicine

## 2019-11-29 ENCOUNTER — Other Ambulatory Visit: Payer: Self-pay

## 2019-11-29 VITALS — BP 120/91 | HR 76 | Temp 97.5°F | Resp 15 | Ht 62.0 in | Wt 102.0 lb

## 2019-11-29 DIAGNOSIS — D128 Benign neoplasm of rectum: Secondary | ICD-10-CM | POA: Diagnosis not present

## 2019-11-29 DIAGNOSIS — K319 Disease of stomach and duodenum, unspecified: Secondary | ICD-10-CM

## 2019-11-29 DIAGNOSIS — K250 Acute gastric ulcer with hemorrhage: Secondary | ICD-10-CM | POA: Diagnosis not present

## 2019-11-29 DIAGNOSIS — D120 Benign neoplasm of cecum: Secondary | ICD-10-CM

## 2019-11-29 DIAGNOSIS — Z720 Tobacco use: Secondary | ICD-10-CM

## 2019-11-29 DIAGNOSIS — K219 Gastro-esophageal reflux disease without esophagitis: Secondary | ICD-10-CM | POA: Diagnosis not present

## 2019-11-29 DIAGNOSIS — Z860100 Personal history of colon polyps, unspecified: Secondary | ICD-10-CM

## 2019-11-29 DIAGNOSIS — K259 Gastric ulcer, unspecified as acute or chronic, without hemorrhage or perforation: Secondary | ICD-10-CM | POA: Diagnosis not present

## 2019-11-29 DIAGNOSIS — K3189 Other diseases of stomach and duodenum: Secondary | ICD-10-CM | POA: Diagnosis not present

## 2019-11-29 DIAGNOSIS — D122 Benign neoplasm of ascending colon: Secondary | ICD-10-CM

## 2019-11-29 DIAGNOSIS — R140 Abdominal distension (gaseous): Secondary | ICD-10-CM

## 2019-11-29 DIAGNOSIS — D127 Benign neoplasm of rectosigmoid junction: Secondary | ICD-10-CM | POA: Diagnosis not present

## 2019-11-29 DIAGNOSIS — D125 Benign neoplasm of sigmoid colon: Secondary | ICD-10-CM

## 2019-11-29 DIAGNOSIS — Z1211 Encounter for screening for malignant neoplasm of colon: Secondary | ICD-10-CM | POA: Diagnosis not present

## 2019-11-29 DIAGNOSIS — R1084 Generalized abdominal pain: Secondary | ICD-10-CM

## 2019-11-29 DIAGNOSIS — K2970 Gastritis, unspecified, without bleeding: Secondary | ICD-10-CM | POA: Diagnosis not present

## 2019-11-29 MED ORDER — SODIUM CHLORIDE 0.9 % IV SOLN: 500.0000 mL | Freq: Once | INTRAVENOUS | Status: DC

## 2019-11-29 NOTE — Progress Notes (Signed)
Vitals-CW Temp-JB  Pt's states no medical or surgical changes since previsit or office visit. 

## 2019-11-29 NOTE — Op Note (Signed)
[name] Procedure Date: 11/29/2019 2:37 PM MRN: [id number] Endoscopist: Jerene Bears , MD Age: 59 Referring MD:  Date of Birth: January 11, 1961 Gender: Female Account #: [id number] Procedure:                Upper GI endoscopy Indications:              Gastro-esophageal reflux disease, Abdominal                            bloating, indigestion, diffuse abdominal                            discomfort, improved since beginning omeprazole 40                            mg daily Medicines:                Monitored Anesthesia Care Procedure:                Pre-Anesthesia Assessment:                           - Prior to the procedure, a History and Physical                            was performed, and patient medications and                            allergies were reviewed. The patient's tolerance of                            previous anesthesia was also reviewed. The risks                            and benefits of the procedure and the sedation                            options and risks were discussed with the patient.                            All questions were answered, and informed consent                            was obtained. Prior Anticoagulants: The patient has                            taken no previous anticoagulant or antiplatelet                            agents. ASA Grade Assessment: II - A patient with                            mild systemic disease. After reviewing the risks  and benefits, the patient was deemed in                            satisfactory condition to undergo the procedure.                           After obtaining informed consent, the endoscope was                            passed under direct vision. Throughout the                            procedure, the patient's blood pressure, pulse, and                            oxygen saturations were monitored continuously. The                             Endoscope was introduced through the mouth, and                            advanced to the second part of duodenum. The upper                            GI endoscopy was accomplished without difficulty.                            The patient tolerated the procedure well. Scope In: Scope Out: Findings:                 A single 4 mm polyp was found in the proximal                            esophagus at 19 cm from the incisors. This is                            benign appearing. The polyp was removed with a cold                            biopsy forceps. Resection and retrieval were                            complete.                           The exam of the esophagus was otherwise normal.                            Z-line is regular at 40 cm.                           One oozing (after contact) superficial gastric  ulcer was found on the greater curvature of the                            gastric body. The lesion was 7 mm in largest                            dimension. Biopsies were taken with a cold forceps                            circumferentially around this ulcer for histology.                           Moderate inflammation characterized by congestion                            (edema) and scattered erythema was found in the                            gastric body and in the gastric antrum. Biopsies                            were taken with a cold forceps for histology and                            Helicobacter pylori testing.                           The examined duodenum was normal. Complications:            No immediate complications. Estimated Blood Loss:     Estimated blood loss was minimal. Impression:               - Esophageal polyp was found. Resected and                            retrieved.                           - Small gastric ulcer. Biopsied.                           - Gastritis. Biopsied.                            - Normal examined duodenum. Recommendation:           - Patient has a contact number available for                            emergencies. The signs and symptoms of potential                            delayed complications were discussed with the                            patient. Return to normal activities tomorrow.  Written discharge instructions were provided to the                            patient.                           - Resume previous diet.                           - Continue present medications. Continue omeprazole                            40 mg daily.                           - Avoid NSAIDs.                           - Await pathology results. Jerene Bears, MD 11/29/2019 3:19:38 PM This report has been signed electronically.

## 2019-11-29 NOTE — Progress Notes (Signed)
Called to room to assist during endoscopic procedure.  Patient ID and intended procedure confirmed with present staff. Received instructions for my participation in the procedure from the performing physician.  

## 2019-11-29 NOTE — Progress Notes (Signed)
Report given to PACU, vss 

## 2019-11-29 NOTE — Patient Instructions (Signed)
Handouts Provided:  Polyps, Gastritis, and Peptic Ulcer  YOU HAD AN ENDOSCOPIC PROCEDURE TODAY AT [facility]:   Refer to the procedure report that was given to you for any specific questions about what was found during the examination.  If the procedure report does not answer your questions, please call your gastroenterologist to clarify.  If you requested that your care partner not be given the details of your procedure findings, then the procedure report has been included in a sealed envelope for you to review at your convenience later.  YOU SHOULD EXPECT: Some feelings of bloating in the abdomen. Passage of more gas than usual.  Walking can help get rid of the air that was put into your GI tract during the procedure and reduce the bloating. If you had a lower endoscopy (such as a colonoscopy or flexible sigmoidoscopy) you may notice spotting of blood in your stool or on the toilet paper. If you underwent a bowel prep for your procedure, you may not have a normal bowel movement for a few days.  Please Note:  You might notice some irritation and congestion in your nose or some drainage.  This is from the oxygen used during your procedure.  There is no need for concern and it should clear up in a day or so.  SYMPTOMS TO REPORT IMMEDIATELY:   Following lower endoscopy (colonoscopy or flexible sigmoidoscopy):  Excessive amounts of blood in the stool  Significant tenderness or worsening of abdominal pains  Swelling of the abdomen that is new, acute  Fever of 100F or higher   Following upper endoscopy (EGD)  Vomiting of blood or coffee ground material  New chest pain or pain under the shoulder blades  Painful or persistently difficult swallowing  New shortness of breath  Fever of 100F or higher  Black, tarry-looking stools  For urgent or emergent issues, a gastroenterologist can be reached at any hour by calling [phone]. Do not use MyChart messaging for urgent  concerns.    DIET:  We do recommend a small meal at first, but then you may proceed to your regular diet.  Drink plenty of fluids but you should avoid alcoholic beverages for 24 hours.  ACTIVITY:  You should plan to take it easy for the rest of today and you should NOT DRIVE or use heavy machinery until tomorrow (because of the sedation medicines used during the test).    FOLLOW UP: Our staff will call the number listed on your records 48-72 hours following your procedure to check on you and address any questions or concerns that you may have regarding the information given to you following your procedure. If we do not reach you, we will leave a message.  We will attempt to reach you two times.  During this call, we will ask if you have developed any symptoms of COVID 19. If you develop any symptoms (ie: fever, flu-like symptoms, shortness of breath, cough etc.) before then, please call [phone].  If you test positive for Covid 19 in the 2 weeks post procedure, please call and report this information to [place].    If any biopsies were taken you will be contacted by phone or by letter within the next 1-3 weeks.  Please call us at [phone] if you have not heard about the biopsies in 3 weeks.    SIGNATURES/CONFIDENTIALITY: You and/or your care partner have signed paperwork which will be entered into your electronic medical record.  These signatures attest  to the fact that that the information above on your After Visit Summary has been reviewed and is understood.  Full responsibility of the confidentiality of this discharge information lies with you and/or your care-partner.

## 2019-11-29 NOTE — Progress Notes (Signed)
Robinul 0.1 mg IV given due large amount of secretions upon assessment.  MD made aware, vss 

## 2019-11-29 NOTE — Op Note (Signed)
[place] Patient Name: [name] [name] Procedure Date: 11/29/2019 2:37 PM MRN: [id number] Endoscopist: Jerene Bears , MD Age: 59 Referring MD:  Date of Birth: 1960/07/20 Gender: Female Account #: [id number] Procedure:                Colonoscopy Indications:              High risk colon cancer surveillance: Personal                            history of colonic polyps, Last colonoscopy: 2015                            (Dr. Benson Norway) Medicines:                Monitored Anesthesia Care Procedure:                Pre-Anesthesia Assessment:                           - Prior to the procedure, a History and Physical                            was performed, and patient medications and                            allergies were reviewed. The patient's tolerance of                            previous anesthesia was also reviewed. The risks                            and benefits of the procedure and the sedation                            options and risks were discussed with the patient.                            All questions were answered, and informed consent                            was obtained. Prior Anticoagulants: The patient has                            taken no previous anticoagulant or antiplatelet                            agents. ASA Grade Assessment: II - A patient with                            mild systemic disease. After reviewing the risks                            and benefits, the patient was deemed in  satisfactory condition to undergo the procedure.                           After obtaining informed consent, the colonoscope                            was passed under direct vision. Throughout the                            procedure, the patient's blood pressure, pulse, and                            oxygen saturations were monitored continuously. The                            Colonoscope was introduced through the anus and                             advanced to the cecum, identified by appendiceal                            orifice and ileocecal valve. The colonoscopy was                            performed without difficulty. The patient tolerated                            the procedure well. The quality of the bowel                            preparation was good. The ileocecal valve,                            appendiceal orifice, and rectum were photographed. Scope In: 2:59:14 PM Scope Out: 3:11:51 PM Scope Withdrawal Time: 0 hours 9 minutes 43 seconds  Total Procedure Duration: 0 hours 12 minutes 37 seconds  Findings:                 The digital rectal exam was normal.                           A 5 mm polyp was found in the cecum. The polyp was                            sessile. The polyp was removed with a cold snare.                            Resection and retrieval were complete.                           A 3 mm polyp was found in the ascending colon. The                            polyp was sessile.  The polyp was removed with a                            cold snare. Resection and retrieval were complete.                           Two sessile polyps were found in the sigmoid colon.                            The polyps were 4 to 5 mm in size. These polyps                            were removed with a cold snare. Resection and                            retrieval were complete.                           A 4 mm polyp was found in the rectum. The polyp was                            sessile. The polyp was removed with a cold snare.                            Resection and retrieval were complete.                           The retroflexed view of the distal rectum and anal                            verge was normal and showed no anal or rectal                            abnormalities. Complications:            No immediate complications. Estimated Blood Loss:     Estimated blood loss was  minimal. Impression:               - One 5 mm polyp in the cecum, removed with a cold                            snare. Resected and retrieved.                           - One 3 mm polyp in the ascending colon, removed                            with a cold snare. Resected and retrieved.                           - Two 4 to 5 mm polyps in the sigmoid colon,  removed with a cold snare. Resected and retrieved.                           - One 4 mm polyp in the rectum, removed with a cold                            snare. Resected and retrieved.                           - The distal rectum and anal verge are normal on                            retroflexion view. Recommendation:           - Patient has a contact number available for                            emergencies. The signs and symptoms of potential                            delayed complications were discussed with the                            patient. Return to normal activities tomorrow.                            Written discharge instructions were provided to the                            patient.                           - Resume previous diet.                           - Continue present medications.                           - Await pathology results.                           - Repeat colonoscopy is recommended. The                            colonoscopy date will be determined after pathology                            results from today's exam become available for                            review. Jerene Bears, MD 11/29/2019 3:22:26 PM This report has been signed electronically.

## 2019-12-03 ENCOUNTER — Telehealth: Payer: Self-pay

## 2019-12-03 NOTE — Telephone Encounter (Signed)
1st follow up call made.  NALM 

## 2019-12-03 NOTE — Telephone Encounter (Signed)
  Follow up Call-  Call back number 11/29/2019  Post procedure Call Back phone  # [phone]  Permission to leave phone message Yes  Some recent data might be hidden     Patient questions:  Do you have a fever, pain , or abdominal swelling? No. Pain Score  0 *  Have you tolerated food without any problems? Yes.    Have you been able to return to your normal activities? Yes.    Do you have any questions about your discharge instructions: Diet   No. Medications  No. Follow up visit  No.  Do you have questions or concerns about your Care? No.  Actions: * If pain score is 4 or above: No action needed, pain <4.  1. Have you developed a fever since your procedure? no  2.   Have you had an respiratory symptoms (SOB or cough) since your procedure? no  3.   Have you tested positive for COVID 19 since your procedure no  4.   Have you had any family members/close contacts diagnosed with the COVID 19 since your procedure?  no   If yes to any of these questions please route to Joylene John, RN and Erenest Rasher, RN

## 2019-12-06 ENCOUNTER — Ambulatory Visit: Payer: BC Managed Care – PPO | Admitting: Gastroenterology

## 2019-12-07 ENCOUNTER — Telehealth: Payer: Self-pay | Admitting: Internal Medicine

## 2019-12-07 NOTE — Telephone Encounter (Signed)
Spoke with patient, pt report burning feeling in her throat. Pt has history of reflux and ulcer, reviewed antireflux measures with patient- no spicy or acidic foods, small frequent meals, bland diet, not eating right before lying down, stay as elevated as possible after eating. Pt advised to continue Omeprazole 40 mg daily, pt advised to call back if she does not have any relief from these recommendations.

## 2019-12-09 ENCOUNTER — Encounter: Payer: Self-pay | Admitting: Internal Medicine

## 2019-12-24 ENCOUNTER — Encounter: Payer: Self-pay | Admitting: Podiatry

## 2019-12-24 ENCOUNTER — Other Ambulatory Visit: Payer: Self-pay

## 2019-12-24 ENCOUNTER — Ambulatory Visit: Payer: BC Managed Care – PPO | Admitting: Podiatry

## 2019-12-24 DIAGNOSIS — B078 Other viral warts: Secondary | ICD-10-CM | POA: Diagnosis not present

## 2019-12-24 DIAGNOSIS — M79671 Pain in right foot: Secondary | ICD-10-CM | POA: Diagnosis not present

## 2019-12-24 DIAGNOSIS — L84 Corns and callosities: Secondary | ICD-10-CM | POA: Diagnosis not present

## 2019-12-24 DIAGNOSIS — M79672 Pain in left foot: Secondary | ICD-10-CM

## 2019-12-24 DIAGNOSIS — B079 Viral wart, unspecified: Secondary | ICD-10-CM

## 2019-12-25 ENCOUNTER — Telehealth: Payer: Self-pay | Admitting: *Deleted

## 2019-12-25 ENCOUNTER — Telehealth: Payer: Self-pay

## 2019-12-25 DIAGNOSIS — L97929 Non-pressure chronic ulcer of unspecified part of left lower leg with unspecified severity: Secondary | ICD-10-CM

## 2019-12-25 DIAGNOSIS — I8390 Asymptomatic varicose veins of unspecified lower extremity: Secondary | ICD-10-CM

## 2019-12-25 DIAGNOSIS — L97919 Non-pressure chronic ulcer of unspecified part of right lower leg with unspecified severity: Secondary | ICD-10-CM

## 2019-12-25 NOTE — Progress Notes (Signed)
Subjective:   Patient ID: [name] [name], female   DOB: 59 y.o.   MRN: [id number]   HPI 59 year old female presents the office today for concerns of thick calluses, possible warts to both of her feet.  She gets the calluses submetatarsal 2, 3 no symmetrical bilaterally.  This is been on the last 3 to 5 months has been getting worse.  She states it causes discomfort pressure.  Her primary care physician tried to trim the right side, should help some.  She believes this is from wearing high-heeled shoes.  Denies any drainage or pus coming from the area no swelling or redness.   Also has concerns for varicose veins.  No ulcers.    No other concerns today.   Review of Systems  All other systems reviewed and are negative.  Past Medical History:  Diagnosis Date  . Concussion   . Dizziness   . GERD (gastroesophageal reflux disease)   . Hypertension   . Thyroid disease     Past Surgical History:  Procedure Laterality Date  . CESAREAN SECTION N/A 1998  . DIAGNOSTIC LAPAROSCOPY N/A    to eval endometriosis     Current Outpatient Medications:  .  alendronate (FOSAMAX) 70 MG tablet, Take 70 mg by mouth once a week., Disp: , Rfl:  .  ALPRAZolam (XANAX) 0.5 MG tablet, Take 0.5 mg by mouth 2 (two) times daily as needed for sleep or anxiety., Disp: , Rfl:  .  hyoscyamine (LEVSIN SL) 0.125 MG SL tablet, Place 0.125 mg under the tongue every 6 (six) hours as needed. , Disp: , Rfl:  .  levothyroxine (SYNTHROID) 88 MCG tablet, Take 88 mcg by mouth daily before breakfast., Disp: , Rfl:  .  losartan (COZAAR) 50 MG tablet, Take by mouth. , Disp: , Rfl:  .  meclizine (ANTIVERT) 25 MG tablet, Take 25 mg by mouth 3 (three) times daily as needed for dizziness., Disp: , Rfl:  .  meloxicam (MOBIC) 15 MG tablet, Take 15 mg by mouth daily., Disp: , Rfl:  .  metoprolol tartrate (LOPRESSOR) 25 MG tablet, Take 50 mg by mouth daily. , Disp: , Rfl:  .  NON FORMULARY, Salicylic acid 40% Urea 98% cream Sig:  apply to affected area once or twice daily and cover as directed 30gm Refill Prn, Disp: , Rfl:  .  omeprazole (PRILOSEC) 40 MG capsule, Take 1 capsule (40 mg total) by mouth daily., Disp: 40 capsule, Rfl: 1 .  Simethicone (GAS-X PO), Take by mouth in the morning and at bedtime., Disp: , Rfl:  .  Vitamin D, Ergocalciferol, (DRISDOL) 50000 units CAPS capsule, Take 50,000 Units by mouth every 7 (seven) days., Disp: , Rfl:   Allergies  Allergen Reactions  . Codeine Rash    halucinations         Objective:  Physical Exam  General: AAO x3, NAD  Dermatological: Thick hyperkeratotic lesion submetatarsal 2, 3 bilaterally.  On the right side there is 1 small area of evidence of verruca but otherwise appears to be diffuse callus.  No ongoing ulceration drainage or signs of infection.  Vascular: Dorsalis Pedis artery and Posterior Tibial artery pedal pulses are 2/4 bilateral with immedate capillary fill time.  Mild varicose veins present.  There is no pain with calf compression, swelling, warmth, erythema.   Neruologic: Grossly intact via light touch bilateral.   Musculoskeletal: No gross boney pedal deformities bilateral. No pain, crepitus, or limitation noted with foot and ankle range of motion bilateral.  Muscular strength 5/5 in all groups tested bilateral.  Gait: Unassisted, Nonantalgic.       Assessment:   Diffuse hyperkeratotic lesion bilaterally, small verruca right foot; varicose veins     Plan:  -Treatment options discussed including all alternatives, risks, and complications -Etiology of symptoms were discussed -Sharply debrided lesions without any complications or bleeding.  There is a thick callus with a small poke on the right side.  I ordered today a compound cream to include salicylic acid as well as urea to help with both issues.  Offloading. -Refer to [place] specialist  Trula Slade DPM

## 2019-12-25 NOTE — Telephone Encounter (Signed)
-----   Message from Trula Slade, DPM sent at 12/25/2019  6:44 AM EDT ----- Can you please put in a referral for [facility]? Thanks.

## 2019-12-25 NOTE — Telephone Encounter (Signed)
Faxed referral, Snap[employer]ot with clinicals and demographics to [facility].

## 2019-12-25 NOTE — Telephone Encounter (Signed)
[facility] requires demographics and insurance information with prescriptions.

## 2020-01-18 ENCOUNTER — Other Ambulatory Visit: Payer: Self-pay | Admitting: Nurse Practitioner

## 2020-02-11 ENCOUNTER — Ambulatory Visit: Payer: BC Managed Care – PPO | Admitting: Podiatry

## 2020-02-25 DIAGNOSIS — L821 Other seborrheic keratosis: Secondary | ICD-10-CM | POA: Diagnosis not present

## 2020-02-25 DIAGNOSIS — D225 Melanocytic nevi of trunk: Secondary | ICD-10-CM | POA: Diagnosis not present

## 2020-02-25 DIAGNOSIS — I87393 Chronic venous hypertension (idiopathic) with other complications of bilateral lower extremity: Secondary | ICD-10-CM | POA: Diagnosis not present

## 2020-03-10 ENCOUNTER — Ambulatory Visit: Payer: BC Managed Care – PPO | Admitting: Podiatry

## 2020-03-10 DIAGNOSIS — E039 Hypothyroidism, unspecified: Secondary | ICD-10-CM | POA: Diagnosis not present

## 2020-03-14 ENCOUNTER — Other Ambulatory Visit: Payer: Self-pay | Admitting: Internal Medicine

## 2020-03-14 DIAGNOSIS — F17200 Nicotine dependence, unspecified, uncomplicated: Secondary | ICD-10-CM

## 2020-03-17 DIAGNOSIS — E039 Hypothyroidism, unspecified: Secondary | ICD-10-CM | POA: Diagnosis not present

## 2020-03-28 ENCOUNTER — Other Ambulatory Visit: Payer: BC Managed Care – PPO

## 2020-03-31 ENCOUNTER — Ambulatory Visit: Payer: BC Managed Care – PPO | Admitting: Podiatry

## 2020-03-31 ENCOUNTER — Other Ambulatory Visit: Payer: Self-pay

## 2020-03-31 ENCOUNTER — Ambulatory Visit
Admission: RE | Admit: 2020-03-31 | Discharge: 2020-03-31 | Disposition: A | Discharge status: Home / Self Care | Payer: BC Managed Care – PPO | Source: Ambulatory Visit | Attending: Internal Medicine | Admitting: Internal Medicine

## 2020-03-31 ENCOUNTER — Other Ambulatory Visit: Payer: BC Managed Care – PPO

## 2020-03-31 DIAGNOSIS — R059 Cough, unspecified: Secondary | ICD-10-CM | POA: Diagnosis not present

## 2020-03-31 DIAGNOSIS — F17200 Nicotine dependence, unspecified, uncomplicated: Secondary | ICD-10-CM

## 2020-04-25 DIAGNOSIS — J40 Bronchitis, not specified as acute or chronic: Secondary | ICD-10-CM | POA: Diagnosis not present

## 2020-04-25 DIAGNOSIS — R059 Cough, unspecified: Secondary | ICD-10-CM | POA: Diagnosis not present

## 2020-08-15 ENCOUNTER — Other Ambulatory Visit: Payer: Self-pay | Admitting: Nurse Practitioner

## 2020-10-06 DIAGNOSIS — E039 Hypothyroidism, unspecified: Secondary | ICD-10-CM | POA: Diagnosis not present

## 2020-10-06 DIAGNOSIS — I10 Essential (primary) hypertension: Secondary | ICD-10-CM | POA: Diagnosis not present

## 2020-11-24 DIAGNOSIS — R0781 Pleurodynia: Secondary | ICD-10-CM | POA: Diagnosis not present

## 2020-11-24 DIAGNOSIS — M25519 Pain in unspecified shoulder: Secondary | ICD-10-CM | POA: Diagnosis not present

## 2020-12-02 DIAGNOSIS — M25512 Pain in left shoulder: Secondary | ICD-10-CM | POA: Diagnosis not present

## 2020-12-02 DIAGNOSIS — S2242XA Multiple fractures of ribs, left side, initial encounter for closed fracture: Secondary | ICD-10-CM | POA: Diagnosis not present

## 2020-12-09 DIAGNOSIS — M25512 Pain in left shoulder: Secondary | ICD-10-CM | POA: Diagnosis not present

## 2020-12-23 DIAGNOSIS — E039 Hypothyroidism, unspecified: Secondary | ICD-10-CM | POA: Diagnosis not present

## 2020-12-23 DIAGNOSIS — I10 Essential (primary) hypertension: Secondary | ICD-10-CM | POA: Diagnosis not present

## 2020-12-23 DIAGNOSIS — S2242XA Multiple fractures of ribs, left side, initial encounter for closed fracture: Secondary | ICD-10-CM | POA: Diagnosis not present

## 2020-12-29 DIAGNOSIS — I10 Essential (primary) hypertension: Secondary | ICD-10-CM | POA: Diagnosis not present

## 2020-12-29 DIAGNOSIS — Z0000 Encounter for general adult medical examination without abnormal findings: Secondary | ICD-10-CM | POA: Diagnosis not present

## 2020-12-29 DIAGNOSIS — I700 Atherosclerosis of aorta: Secondary | ICD-10-CM | POA: Diagnosis not present

## 2021-01-22 ENCOUNTER — Other Ambulatory Visit: Payer: Self-pay | Admitting: Obstetrics and Gynecology

## 2021-01-22 DIAGNOSIS — Z1231 Encounter for screening mammogram for malignant neoplasm of breast: Secondary | ICD-10-CM

## 2021-02-09 ENCOUNTER — Other Ambulatory Visit: Payer: Self-pay

## 2021-02-09 ENCOUNTER — Ambulatory Visit
Admission: RE | Admit: 2021-02-09 | Discharge: 2021-02-09 | Disposition: A | Discharge status: Home / Self Care | Payer: BC Managed Care – PPO | Source: Ambulatory Visit | Attending: Obstetrics and Gynecology | Admitting: Obstetrics and Gynecology

## 2021-02-09 DIAGNOSIS — Z1231 Encounter for screening mammogram for malignant neoplasm of breast: Secondary | ICD-10-CM

## 2021-02-24 ENCOUNTER — Ambulatory Visit: Payer: BC Managed Care – PPO

## 2021-03-17 DIAGNOSIS — U071 COVID-19: Secondary | ICD-10-CM | POA: Diagnosis not present

## 2021-03-31 DIAGNOSIS — R509 Fever, unspecified: Secondary | ICD-10-CM | POA: Diagnosis not present

## 2021-03-31 DIAGNOSIS — R051 Acute cough: Secondary | ICD-10-CM | POA: Diagnosis not present

## 2021-05-05 DIAGNOSIS — I10 Essential (primary) hypertension: Secondary | ICD-10-CM | POA: Diagnosis not present

## 2021-06-04 DIAGNOSIS — R102 Pelvic and perineal pain: Secondary | ICD-10-CM | POA: Diagnosis not present

## 2021-06-04 DIAGNOSIS — R109 Unspecified abdominal pain: Secondary | ICD-10-CM | POA: Diagnosis not present

## 2021-06-09 DIAGNOSIS — R102 Pelvic and perineal pain: Secondary | ICD-10-CM | POA: Diagnosis not present

## 2021-06-29 DIAGNOSIS — R87619 Unspecified abnormal cytological findings in specimens from cervix uteri: Secondary | ICD-10-CM | POA: Insufficient documentation

## 2021-06-30 DIAGNOSIS — I10 Essential (primary) hypertension: Secondary | ICD-10-CM | POA: Insufficient documentation

## 2021-06-30 DIAGNOSIS — Z124 Encounter for screening for malignant neoplasm of cervix: Secondary | ICD-10-CM | POA: Diagnosis not present

## 2021-06-30 DIAGNOSIS — E785 Hyperlipidemia, unspecified: Secondary | ICD-10-CM | POA: Insufficient documentation

## 2021-06-30 DIAGNOSIS — Z01419 Encounter for gynecological examination (general) (routine) without abnormal findings: Secondary | ICD-10-CM | POA: Diagnosis not present

## 2021-06-30 DIAGNOSIS — Z6821 Body mass index (BMI) 21.0-21.9, adult: Secondary | ICD-10-CM | POA: Diagnosis not present

## 2021-06-30 DIAGNOSIS — Z1151 Encounter for screening for human papillomavirus (HPV): Secondary | ICD-10-CM | POA: Diagnosis not present

## 2021-07-06 IMAGING — CT CT CHEST W/O CM
1 series · 16 of 34 positions shown, 20 images · non-contrast
Comparison: None.

CLINICAL DATA: Cough

EXAM:
CT CHEST WITHOUT CONTRAST
TECHNIQUE: Multidetector CT imaging of the chest was performed following the
standard protocol without IV contrast.

[Series 2: chest w/(date) · axial · 0.59mm/px · z∈[-270,+4]mm · 16 of 155 slices shown, 20 images]
[im 12/155  mediastinal]
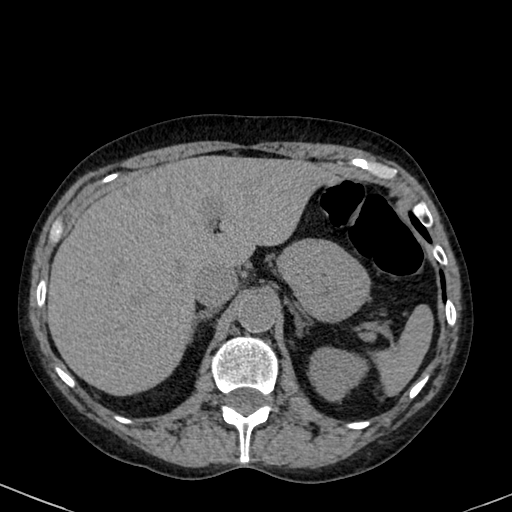
[im 12/155  lung]
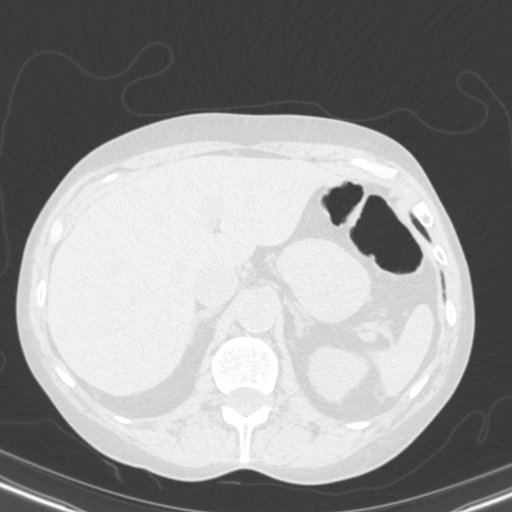
[im 23/155  lung]
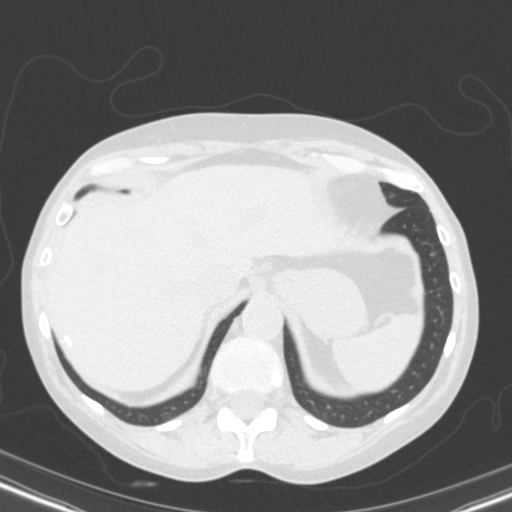
[im 31/155  lung]
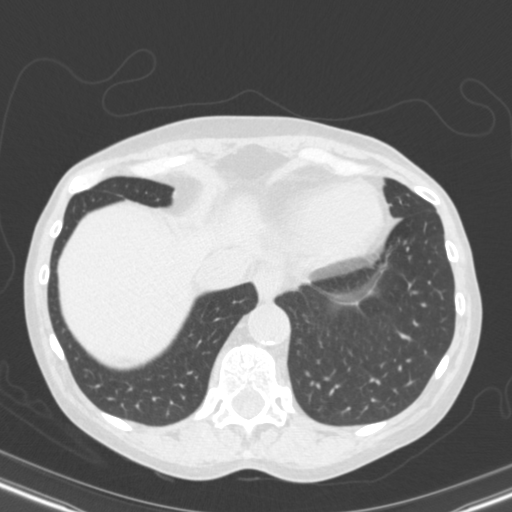
[im 40/155  lung]
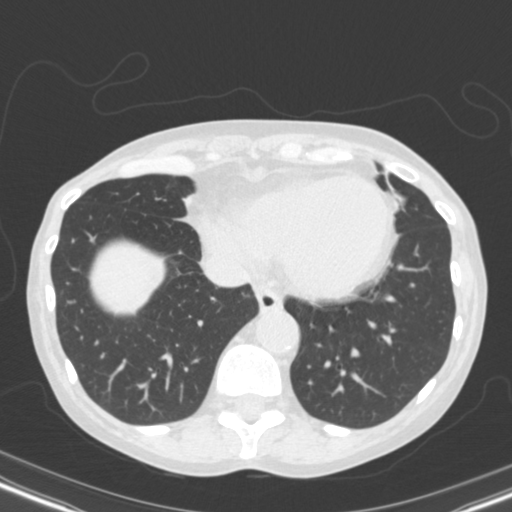
[im 52/155  mediastinal]
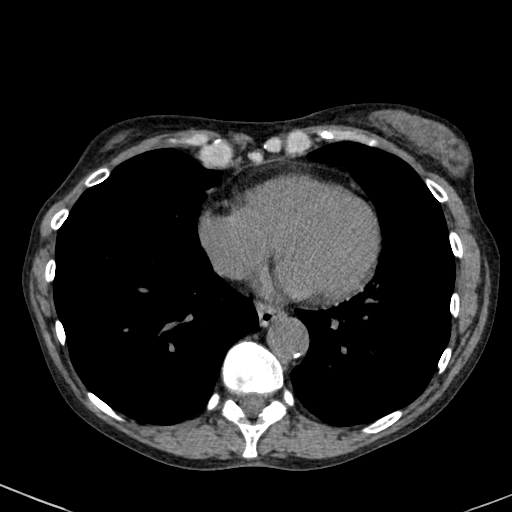
[im 52/155  lung]
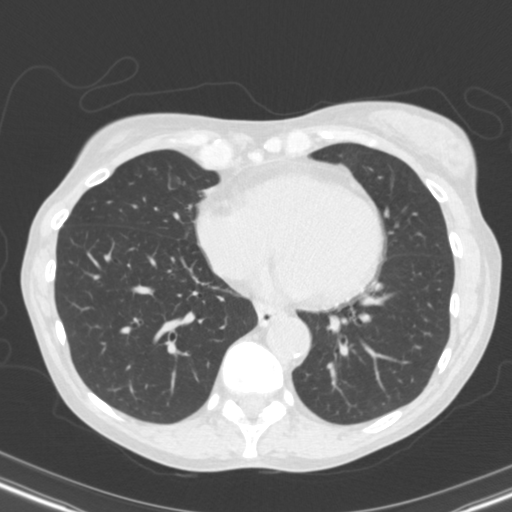
[im 62/155  lung]
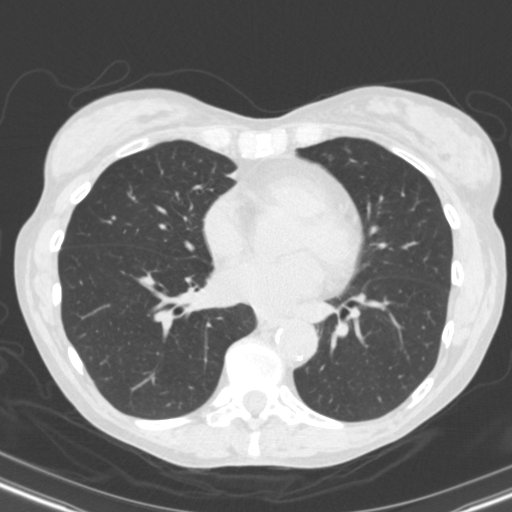
[im 69/155  lung]
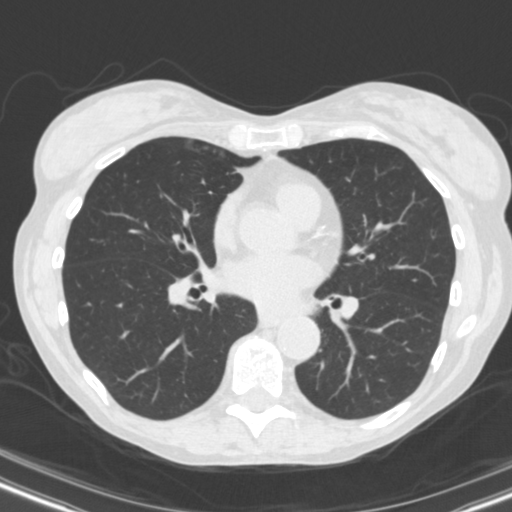
[im 75/155  lung]
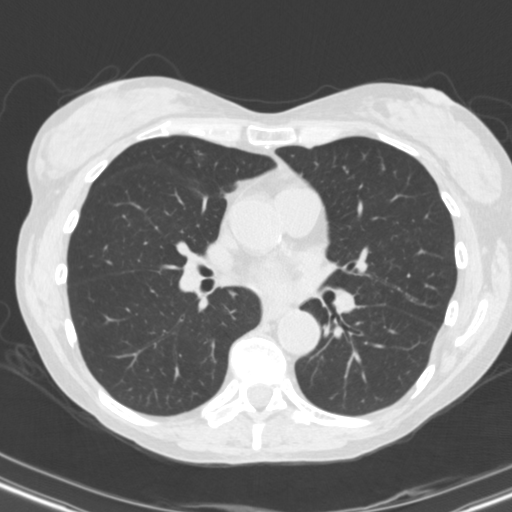
[im 82/155  mediastinal]
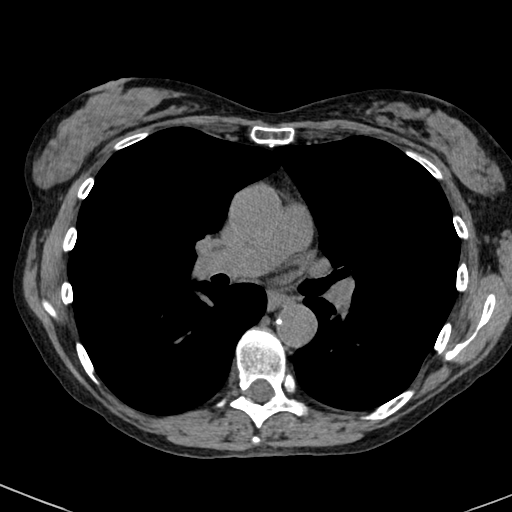
[im 82/155  lung]
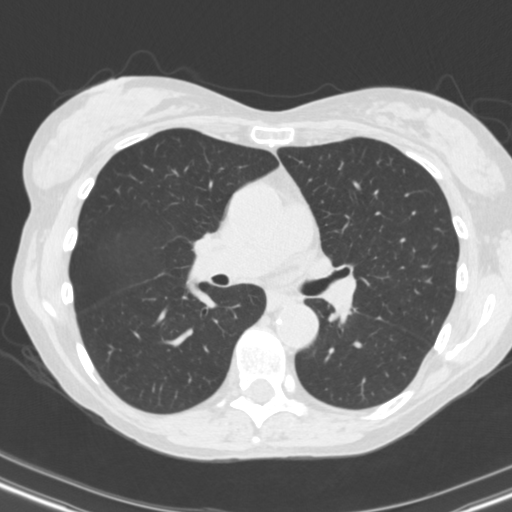
[im 92/155  lung]
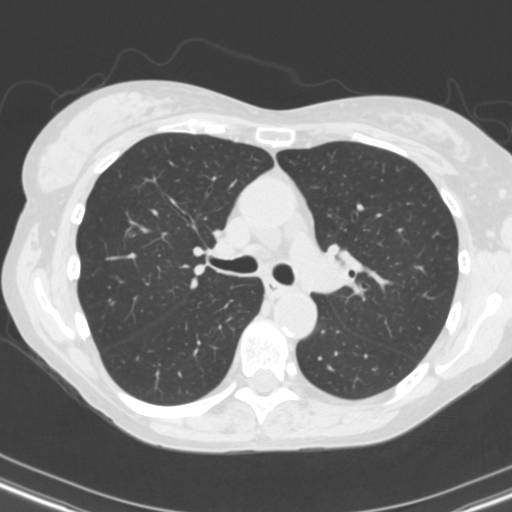
[im 97/155  lung]
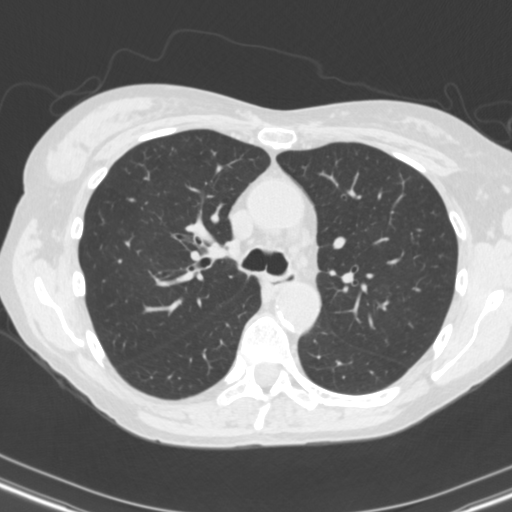
[im 109/155  lung]
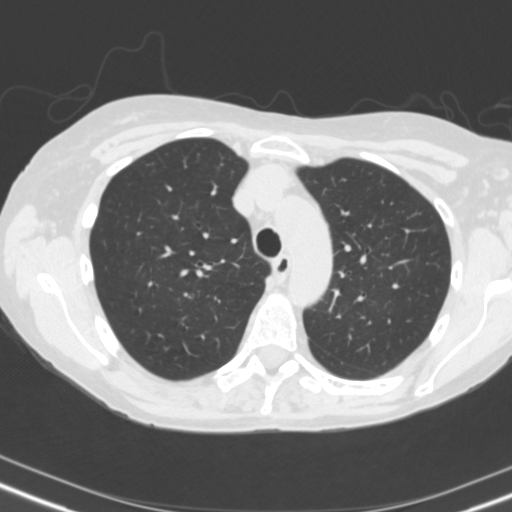
[im 120/155  mediastinal]
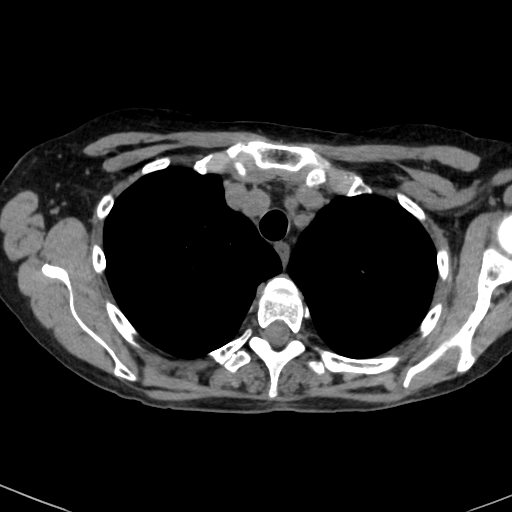
[im 120/155  lung]
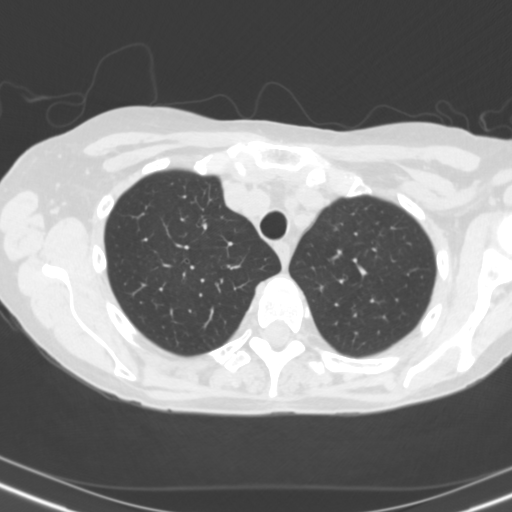
[im 126/155  lung]
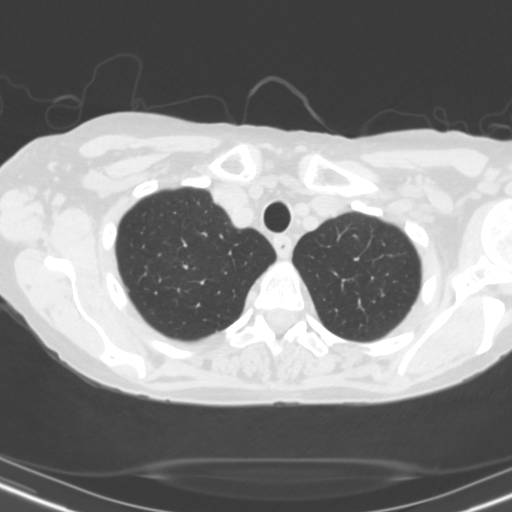
[im 137/155  lung]
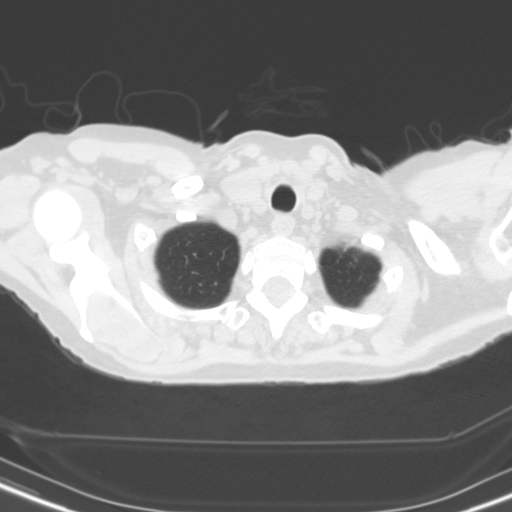
[im 149/155  lung]
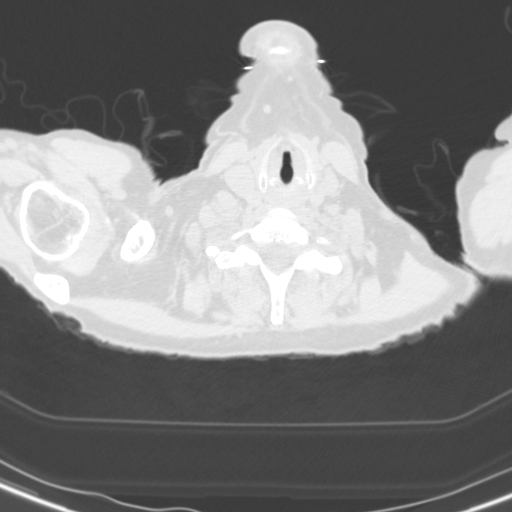

[16 of 34 positions shown; findings below may reference images not displayed]

FINDINGS: Cardiovascular: Aortic and coronary calcifications. Heart is normal
size. Aorta is normal caliber.

Mediastinum/Nodes: No mediastinal, hilar, or axillary adenopathy.
Trachea and esophagus are unremarkable. Thyroid unremarkable.

Lungs/Pleura: A few areas of mucous plugging noted within the upper
lobe bronchi. Several areas of the wrist blood can have resolved
since prior study. No confluent opacities or effusions. No
suspicious pulmonary nodules.

Upper Abdomen: Imaging into the upper abdomen demonstrates no acute
findings.

Musculoskeletal: Chest wall soft tissues are unremarkable. No acute
bony abnormality.
IMPRESSION: Partial clearing of mucous plugging within the upper lobe bronchi.
No confluent opacities or suspicious pulmonary nodules.

Coronary artery disease.

Aortic Atherosclerosis ([id number]-[id number]).

## 2021-07-15 ENCOUNTER — Other Ambulatory Visit: Payer: Self-pay

## 2021-07-16 ENCOUNTER — Ambulatory Visit
Admission: RE | Admit: 2021-07-16 | Discharge: 2021-07-16 | Disposition: A | Discharge status: Home / Self Care | Payer: BC Managed Care – PPO | Source: Ambulatory Visit | Attending: Internal Medicine | Admitting: Internal Medicine

## 2021-07-16 ENCOUNTER — Other Ambulatory Visit: Payer: Self-pay

## 2021-07-16 ENCOUNTER — Other Ambulatory Visit: Payer: Self-pay | Admitting: Internal Medicine

## 2021-07-16 DIAGNOSIS — I700 Atherosclerosis of aorta: Secondary | ICD-10-CM | POA: Diagnosis not present

## 2021-07-16 DIAGNOSIS — R1032 Left lower quadrant pain: Secondary | ICD-10-CM

## 2021-07-16 DIAGNOSIS — I10 Essential (primary) hypertension: Secondary | ICD-10-CM | POA: Diagnosis not present

## 2021-07-16 DIAGNOSIS — R140 Abdominal distension (gaseous): Secondary | ICD-10-CM | POA: Diagnosis not present

## 2021-07-16 LAB — BASIC METABOLIC PANEL
BUN: 8 (ref 4–21)
CO2: 21 (ref 13–22)
Chloride: 102 (ref 99–108)
Creatinine: 0.7 (ref 0.5–1.1)
Glucose: 101
Potassium: 5.3 (ref 3.4–5.3)
Sodium: 138 (ref 137–147)

## 2021-07-16 LAB — HEPATIC FUNCTION PANEL
ALT: 21 (ref 7–35)
AST: 20 (ref 13–35)
Alkaline Phosphatase: 68 (ref 25–125)
Bilirubin, Total: 0.8

## 2021-07-16 LAB — COMPREHENSIVE METABOLIC PANEL
Albumin: 4.3 (ref 3.5–5.0)
Calcium: 9.3 (ref 8.7–10.7)
eGFR: 102.9

## 2021-07-16 MED ORDER — IOPAMIDOL (ISOVUE-300) INJECTION 61%
80.0000 mL | Freq: Once | INTRAVENOUS | Status: AC | PRN
  Administered 2021-07-16: 80 mL via INTRAVENOUS

## 2021-07-23 DIAGNOSIS — R1013 Epigastric pain: Secondary | ICD-10-CM | POA: Insufficient documentation

## 2021-07-23 DIAGNOSIS — Z860100 Personal history of colon polyps, unspecified: Secondary | ICD-10-CM | POA: Insufficient documentation

## 2021-07-23 DIAGNOSIS — R143 Flatulence: Secondary | ICD-10-CM | POA: Insufficient documentation

## 2021-07-24 ENCOUNTER — Ambulatory Visit: Payer: BC Managed Care – PPO | Admitting: Nurse Practitioner

## 2021-07-24 ENCOUNTER — Encounter: Payer: Self-pay | Admitting: Nurse Practitioner

## 2021-07-24 VITALS — BP 130/86 | HR 70 | Ht 62.0 in | Wt 115.0 lb

## 2021-07-24 DIAGNOSIS — Z8711 Personal history of peptic ulcer disease: Secondary | ICD-10-CM | POA: Diagnosis not present

## 2021-07-24 DIAGNOSIS — R1012 Left upper quadrant pain: Secondary | ICD-10-CM

## 2021-07-24 NOTE — Patient Instructions (Addendum)
RECOMMENDATIONS: ? ?Call our office when you are ready to schedule the EGD with Dr. Hilarie Fredrickson. ?Continue Omeprazole 40 MG once a day, may increase to twice a day if needed. ?Reduce alcohol intake. ?Please Avoid all NSAID's. Some examples of NSAID's are as follows: ?Aspirin (Bufferin, Bayer, and Excedrin) ?Ibuprofen (Advil, Motrin, Nuprin) ?Ketoprofen (Actron, Orudis) ?Naproxen (Aleve) ?Daypro  ?Indocin  ?Lodine  ?Naprosyn  ?Relafen  ?Vimovo ?Voltaren ? ?Peptic Ulcer ?A peptic ulcer is a painful sore in the lining of your stomach or the first part of your small intestine. ?What are the causes? ?Common causes of this condition include: ?An infection. ?Using certain pain medicines too often or too much. ?Rare tumors in the stomach, small intestine, or pancreas. ?What increases the risk? ?You are more likely to get this condition if you: ?Smoke. ?Have a family history of ulcer disease. ?Drink alcohol. ?Have been hospitalized in an intensive care unit (ICU). ?What are the signs or symptoms? ?Symptoms include: ?Burning pain in the area between the chest and the belly button. The pain may: ?Not go away (be persistent). ?Be worse when your stomach is empty. ?Be worse at night. ?Heartburn. ?Feeling sick to your stomach (nauseous) and throwing up (vomiting). ?Bloating. ?If the ulcer results in bleeding, it can cause you to: ?Have poop (stool) that is black and looks like tar. ?Throw up bright red blood. ?Throw up material that looks like coffee grounds. ?How is this treated? ?Treatment for this condition may include: ?Stopping things that can cause the ulcer, such as: ?Smoking. ?Using pain medicines. ?Drinking alcohol or caffeine. ?Medicines to reduce stomach acid. ?Antibiotic medicines if the ulcer is caused by an infection. ?A procedure that is done using a small, flexible tube that has a camera at the end (upper endoscopy). This may be done if you have a bleeding ulcer. ?Surgery. This may be needed if: ?You have a lot of  bleeding. ?The ulcer caused a hole somewhere in the digestive system. ?Follow these instructions at home: ?Do not drink alcohol if your doctor tells you not to drink. ?Limit how much caffeine you take in. ?Do not smoke or use any products that contain nicotine or tobacco. If you need help quitting, ask your doctor. ?Take over-the-counter and prescription medicines only as told by your doctor. ?Do not stop or change your medicines unless you talk with your doctor about it first. ?Do not take aspirin, ibuprofen, or other NSAIDs unless your doctor told you to do so. ?Keep all follow-up visits. ?Contact a doctor if: ?You do not get better in 7 days after you start treatment. ?You keep having an upset stomach (indigestion) or heartburn. ?Get help right away if: ?You have sudden, sharp pain in your belly (abdomen). ?You have belly pain that does not go away. ?You have bloody poop (stool) or black, tarry poop. ?You throw up blood. It may look like coffee grounds. ?You feel light-headed or feel like you may pass out (faint). ?You get weak. ?You get sweaty or feel sticky and cold to the touch (clammy). ?These symptoms may be an emergency. Get help right away. Call 911. ?Do not wait to see if the symptoms will go away. ?Do not drive yourself to the hospital. ?Summary ?Symptoms of a peptic ulcer include burning pain in the area between the chest and the belly button. ?Do not smoke or use any products that contain nicotine or tobacco. If you need help quitting, ask your doctor. ?Take medicines only as told by your doctor. ?Limit how  much alcohol and caffeine you have. ?Keep all follow-up visits. ?This information is not intended to replace advice given to you by your health care provider. Make sure you discuss any questions you have with your health care provider. ?Document Revised: 12/12/2020 Document Reviewed: 12/12/2020 ?Elsevier Patient Education ? [place]. ? ?Thank you for trusting me with your gastrointestinal  care!   ? ?Noralyn Pick, CRNP ? ? ? ?BMI: ? ?If you are age 56 or older, your body mass index should be between 23-30. Your Body mass index is 21.03 kg/m?Marland Kitchen If this is out of the aforementioned range listed, please consider follow up with your Primary Care Provider. ? ?If you are age 70 or younger, your body mass index should be between 19-25. Your Body mass index is 21.03 kg/m?Marland Kitchen If this is out of the aformentioned range listed, please consider follow up with your Primary Care Provider.  ? ?MY CHART: ? ?The [facility] providers would like to encourage you to use [facility] to communicate with providers for non-urgent requests or questions.  Due to long hold times on the telephone, sending your provider a message by [facility] may be a faster and more efficient way to get a response.  Please allow 48 business hours for a response.  Please remember that this is for non-urgent requests.  ? ?

## 2021-07-24 NOTE — Progress Notes (Signed)
07/24/2021 [name] [name] [id number] [birth year]   Chief Complaint: left upper abdominal pain   History of Present Illness:  [name] [name] is a 61 year old female with a past medical history of hypertension, hypothyroidism, vertigo, osteoporosis on Fosomax, GERD and colon polyps.   I last saw [place] in the office 11/09/2019 for further evaluation regarding abdominal bloat, generalized abdominal discomfort and indigestion. An EGD and colonoscopy were done by Dr. Levie Heritage on 11/29/2019. The EGD identified a 74m superficial gastric ulcer with oozing after contact and gastritis. Biopsies were consistent with reactive gastropathy without evidence of H. Pylori or dysplasia. A 431mpolyp was identified to the proximal esophagus and biopsies showed a benign squamous papilloma. She was advised to avoid NSAIDs and to continue Pantoprazole 4064mD which she took for a few months. The colonoscopy identified a polyp removed from the ascending colon and cecum, biopsies showed benign colonic mucosa. Two 4 to 5 mm polyps were removed from the sigmoid colon and one 4mm70mlyp was removed from the rectum. Biopsies confirmed tubular adenomatous and hyperplastic polyps. She was advised to repeat a colonoscopy in 5 years.   She presents today as referred by Dr. JohnArvella Nigh further evaluation regarding LUQ with an abrupt onset 2 weeks ago with associated abdominal bloat and gas. No N/V. No heartburn of dysphagia. She is passing a normal brown stool daily, sometimes strains to pass a BM. No rectal bleeding or melena. She continues to feel a dull constant pain to the LUQ area, feels full. Taking Hyoscyamine reduces her LUQ pain. She started taking Omeprazole 40mg8mQD as prescribed by her PCP on 07/16/2021 and her LUQ pain has decreased. CTAP 07/16/2021 showed a normal pancreas, decompressed stomach without evidence of a perforation and a normal bowel. She takes Meloxicam as needed for aches/pains, has not taken any x 3  weeks. A few weeks ago, she took Advil 200mg 57m dailyx 5 days. Work related stress is quite high at this time.  She smokes a few cigarettes daily. She drinks 1 to 2 glasses of wine every evening.   Labs 07/16/2021: WBC 9.4. Hg 14.7. HCT 41.5. PLT 290. Glu 101. K+ 5.3. Na+ 138. BUN 8. Cr. 0.7. Alk phos 68. T. Bili 0.8. AST 20. ALT 21.   CTAP with contrast 07/16/2021:  FINDINGS: Lower chest: No acute abnormality.   Hepatobiliary: Evaluation limited motion portal venous phase. No suspicious focal liver abnormality is seen within the limitations. No gallstones, gallbladder wall thickening, or biliary dilatation.   Pancreas: Unremarkable. No pancreatic ductal dilatation or surrounding inflammatory changes.   Spleen: Normal in size without focal abnormality.   Adrenals/Urinary Tract: Adrenal glands are unremarkable. Kidneys enhance symmetrically. No hydronephrosis. No filling defect of the opacified renal collecting system. No obstructing nephrolithiasis. Bladder is decompressed.   Stomach/Bowel: No evidence of bowel obstruction. Appendix is normal. Stomach is decompressed. No focal or diffuse bowel wall thickening.   Vascular/Lymphatic: Atherosclerotic calcifications of the aorta. No aneurysmal dilation of the visualized aorta. No suspicious abdominal lymphadenopathy.   Reproductive: Uterus and bilateral adnexa are unremarkable. Reflux of contrast into the LEFT gonadal vein, nonspecific.   Other: No free air or free fluid.   Musculoskeletal: Remote rib fractures.   IMPRESSION: No CT etiology for acute abdominal pain identified.   Aortic Atherosclerosis   PAST  GI PROCEDURES:   EGD 11/29/2019: Esophageal polyp was found. Resected and retrieved. - Small gastric ulcer. Biopsied. - Gastritis. Biopsied. -Normal examined duodenum.  Colonoscopy 11/29/2019: One 5 mm polyp in the cecum, removed with a cold snare. Resected and retrieved. - One 3 mm polyp in the ascending colon,  removed with a cold snare. Resected and retrieved. - Two 4 to 5 mm polyps in the sigmoid colon, removed with a cold snare. Resected and retrieved. - One 4 mm polyp in the rectum, removed with a cold snare. Resected and retrieved. - The distal rectum and anal verge are normal on retroflexion view. -Recall colonoscopy 5 years 1. Surgical [P], gastric ulcer (greater curve) - REACTIVE GASTROPATHY WITH ULCERATION - NO H. PYLORI OR INTESTINAL METAPLASIA IDENTIFIED - SEE COMMENT 2. Surgical [P], gastric antrum and gastric body - REACTIVE GASTROPATHY - NO H. PYLORI OR INTESTINAL METAPLASIA IDENTIFIED - SEE COMMENT 3. Surgical [P], proximal esophageal polyp - BENIGN SQUAMOUS PAPILLOMA - NO MALIGNANCY IDENTIFIED 4. Surgical [P], colon, cecum, ascending, polyp (2) - MINUTE FRAGMENTS OF BENIGN COLONIC MUCOSA - NO MALIGNANCY IDENTIFIED 5. Surgical [P], colon, rectum and sigmoid, polyp (3) - TUBULAR ADENOMA (1 OF 3 FRAGMENTS) - HYPERPLASTIC POLYP (2 OF 3 FRAGMENTS) - NO HIGH GRADE DYSPLASIA OR MALIGNANCY IDENTIFIED  Current Outpatient Medications on File Prior to Visit  Medication Sig Dispense Refill   alendronate (FOSAMAX) 70 MG tablet Take 70 mg by mouth once a week.     ALPRAZolam (XANAX) 0.5 MG tablet Take 0.5 mg by mouth 2 (two) times daily as needed for sleep or anxiety.     hyoscyamine (LEVSIN SL) 0.125 MG SL tablet Place 0.125 mg under the tongue every 6 (six) hours as needed.      levothyroxine (SYNTHROID) 88 MCG tablet Take 88 mcg by mouth daily before breakfast.     losartan (COZAAR) 50 MG tablet Take by mouth.      meclizine (ANTIVERT) 25 MG tablet Take 25 mg by mouth 3 (three) times daily as needed for dizziness.     meloxicam (MOBIC) 15 MG tablet Take 15 mg by mouth daily.     metoprolol tartrate (LOPRESSOR) 25 MG tablet Take 50 mg by mouth daily.      NON FORMULARY Salicylic acid 98% Urea 33% cream Sig: apply to affected area once or twice daily and cover as directed 30gm Refill  Prn     olmesartan (BENICAR) 20 MG tablet 20 mg. daily     omeprazole (PRILOSEC) 40 MG capsule Take 1 capsule (40 mg total) by mouth daily. OFFICE VISIT DUE FOR FURTHER REFILLS 40 capsule 0   rosuvastatin (CRESTOR) 20 MG tablet 20 mg daily.     Simethicone (GAS-X PO) Take by mouth in the morning and at bedtime.     Vitamin D, Ergocalciferol, (DRISDOL) 50000 units CAPS capsule Take 50,000 Units by mouth every 7 (seven) days.     No current facility-administered medications on file prior to visit.   Allergies  Allergen Reactions   Codeine Rash    halucinations   Current Medications, Allergies, Past Medical History, Past Surgical History, Family History and Social History were reviewed in [employer] record.  Review of Systems:   Constitutional: Negative for fever, sweats, chills or weight loss.  Respiratory: Negative for shortness of breath.   Cardiovascular: Negative for chest pain, palpitations and leg swelling.  Gastrointestinal: See HPI.  Musculoskeletal: Negative for back pain or muscle aches.  Neurological: Negative for dizziness, headaches or paresthesias.   Physical Exam: BP 130/86    Pulse 70    Ht _0  (1.575 m)    Wt 115  lb (52.2 kg)    BMI 21.03 kg/m  General: 61 year old female in NAD.  Head: Normocephalic and atraumatic. Eyes: No scleral icterus. Conjunctiva pink . Ears: Normal auditory acuity. Mouth: Dentition intact. No ulcers or lesions.  Lungs: Clear throughout to auscultation. Heart: Regular rate and rhythm, no murmur. Abdomen: Soft, nontender and nondistended. No masses or hepatomegaly. Normal bowel sounds x 4 quadrants.  Rectal: Deferred.  Musculoskeletal: Symmetrical with no gross deformities. Extremities: No edema. Neurological: Alert oriented x 4. No focal deficits.  Psychological: Alert and cooperative. Normal mood and affect  Assessment and Recommendations:  66) 61 year old female with a history of a small superficial gastric  ulcer with contact bleeding per EGD 11/2019 who developed LUQ pain (abrupt onset  2 weeks ago) which improved after staring Omeprazole 66m QD on 07/16/2021 and after taking Hyoscyamine PRN. No N/V. CTAP 07/16/2021 showed a normal pancreas, stomach was decompressed without evidence of gastric wall thickening or perforation, normal bowel.  Normal CBC and CMP. Intermittent NSAID use. Increased stress level.  -Continue Omeprazole 423mQD, may increase to bid if needed  -GERD/ulcer handout -Reduce alcohol intake  -Ok to take Hyoscyamine as needed  -Avoid NSAIDs ie: Meloxicam and Advil -EGD to rule out recurrent gastric ulcer  benefits and risks discussed including risk with sedation, risk of bleeding, perforation and infection. Patient needed to check her schedule, she will call our office to schedule EGD  -Check lipase level if LUQ pain worsens   2) History of tubular adenomatous and hyperplastic colon polyps per colonoscopy 11/2019 -Next colonoscopy due 11/2024

## 2021-07-27 NOTE — Progress Notes (Signed)
Addendum: Reviewed and agree with assessment and management plan. Madelina Sanda M, MD  

## 2021-08-12 DIAGNOSIS — Z13820 Encounter for screening for osteoporosis: Secondary | ICD-10-CM | POA: Diagnosis not present

## 2021-12-29 ENCOUNTER — Telehealth: Payer: Self-pay | Admitting: *Deleted

## 2021-12-29 NOTE — Chronic Care Management (AMB) (Signed)
  Care Coordination  Outreach Note  12/29/2021 Name: [name] [name] MRN: [id number] DOB: 10-20-1960   Care Coordination Outreach Attempts  An unsuccessful telephone outreach was attempted today to offer the patient information about available care coordination services as a benefit of their health plan.   Follow Up Plan:  Additional outreach attempts will be made to offer the patient care coordination information and services.   Encounter Outcome:  No Answer  Julian Hy, [place] Direct Dial: [phone]

## 2022-01-21 DIAGNOSIS — M25512 Pain in left shoulder: Secondary | ICD-10-CM | POA: Diagnosis not present

## 2022-01-21 DIAGNOSIS — M25511 Pain in right shoulder: Secondary | ICD-10-CM | POA: Diagnosis not present

## 2022-01-29 NOTE — Chronic Care Management (AMB) (Signed)
  Care Coordination   Note   01/29/2022 Name: [name] [name] MRN: [id number] DOB: [birth year]  [name] [name] is a 61 y.o. year old female who sees Donnajean Lopes, MD for primary care. I reached out to Burgess Amor by phone today to offer care coordination services.  [name] was given information about Care Coordination services today including:   The Care Coordination services include support from the care team which includes your Nurse Coordinator, Clinical Social Worker, or Pharmacist.  The Care Coordination team is here to help remove barriers to the health concerns and goals most important to you. Care Coordination services are voluntary, and the patient may decline or stop services at any time by request to their care team member.   Care Coordination Consent Status: Patient agreed to services and verbal consent obtained.   Follow up plan:  Telephone appointment with care coordination team member scheduled for:  02/08/2022  Encounter Outcome:  Pt. Scheduled  Julian Hy, [place] Direct Dial: [phone]

## 2022-02-01 DIAGNOSIS — M25511 Pain in right shoulder: Secondary | ICD-10-CM | POA: Diagnosis not present

## 2022-02-05 DIAGNOSIS — M79671 Pain in right foot: Secondary | ICD-10-CM | POA: Diagnosis not present

## 2022-02-05 DIAGNOSIS — L03115 Cellulitis of right lower limb: Secondary | ICD-10-CM | POA: Diagnosis not present

## 2022-02-08 ENCOUNTER — Ambulatory Visit: Payer: Self-pay

## 2022-02-08 NOTE — Patient Outreach (Signed)
  Care Coordination   02/08/2022 Name: [name] [name] MRN: [id number] DOB: 07-Nov-1960   Care Coordination Outreach Attempts:  An unsuccessful telephone outreach was attempted for a scheduled appointment today.  Follow Up Plan:  Additional outreach attempts will be made to offer the patient care coordination information and services.   Encounter Outcome:  No Answer  Care Coordination Interventions Activated:  No   Care Coordination Interventions:  No, not indicated    Barb Merino, RN, BSN, CCM Care Management Coordinator [place] [facility] Direct Phone: [phone]

## 2022-02-10 ENCOUNTER — Telehealth: Payer: Self-pay | Admitting: *Deleted

## 2022-02-10 DIAGNOSIS — M79671 Pain in right foot: Secondary | ICD-10-CM | POA: Diagnosis not present

## 2022-02-10 DIAGNOSIS — L03115 Cellulitis of right lower limb: Secondary | ICD-10-CM | POA: Diagnosis not present

## 2022-02-10 DIAGNOSIS — T25221D Burn of second degree of right foot, subsequent encounter: Secondary | ICD-10-CM | POA: Diagnosis not present

## 2022-02-10 NOTE — Chronic Care Management (AMB) (Signed)
  Care Coordination   Note   02/10/2022 Name: LISHA VITALE MRN: [id number] DOB: 03/28/1961  KYERRA VARGO is a 61 y.o. year old female who sees Donnajean Lopes, MD for primary care. I reached out to Burgess Amor by phone today to reschedule for care coordination services.   Follow up plan:  Unsuccessful telephone outreach attempt made. A HIPAA compliant phone message was left for the patient providing contact information and requesting a return call.  Encounter Outcome:  No Answer  [place]  Direct Dial: [phone]

## 2022-02-11 DIAGNOSIS — S42254D Nondisplaced fracture of greater tuberosity of right humerus, subsequent encounter for fracture with routine healing: Secondary | ICD-10-CM | POA: Diagnosis not present

## 2022-02-11 DIAGNOSIS — M25511 Pain in right shoulder: Secondary | ICD-10-CM | POA: Diagnosis not present

## 2022-02-16 ENCOUNTER — Encounter (HOSPITAL_BASED_OUTPATIENT_CLINIC_OR_DEPARTMENT_OTHER): Payer: 59 | Attending: Internal Medicine | Admitting: Internal Medicine

## 2022-02-16 DIAGNOSIS — W208XXA Other cause of strike by thrown, projected or falling object, initial encounter: Secondary | ICD-10-CM | POA: Diagnosis not present

## 2022-02-16 DIAGNOSIS — L03115 Cellulitis of right lower limb: Secondary | ICD-10-CM | POA: Diagnosis not present

## 2022-02-16 DIAGNOSIS — E039 Hypothyroidism, unspecified: Secondary | ICD-10-CM | POA: Diagnosis not present

## 2022-02-16 DIAGNOSIS — T24231A Burn of second degree of right lower leg, initial encounter: Secondary | ICD-10-CM | POA: Diagnosis not present

## 2022-02-16 DIAGNOSIS — K219 Gastro-esophageal reflux disease without esophagitis: Secondary | ICD-10-CM | POA: Diagnosis not present

## 2022-02-16 DIAGNOSIS — I10 Essential (primary) hypertension: Secondary | ICD-10-CM | POA: Insufficient documentation

## 2022-02-16 NOTE — Progress Notes (Signed)
CYBIL, SENEGAL ([id number]) Visit Report for 02/16/2022 Abuse Risk Screen Details Patient Name: Date of Service: [name] [name], [name] [name]. 02/16/2022 8:00 [name] M Medical Record Number: [id number] Patient Account Number: [id number] Date of Birth/Sex: Treating RN: 1961/03/20 (61 y.o. [name], [name] Primary Care Nirvana Blanchett: Leanna Battles Other Clinician: Referring Avanell Banwart: Treating Shaddai Shapley/Extender: Darrold Junker in Treatment: 0 Abuse Risk Screen Items Answer ABUSE RISK SCREEN: Has anyone close to you tried to hurt or harm you recentlyo No Do you feel uncomfortable with anyone in your familyo No Has anyone forced you do things that you didnt want to doo No Electronic Signature(s) Signed: 02/16/2022 4:30:19 PM By: Rhae Hammock RN Entered By: Rhae Hammock on 02/16/2022 08:13:37 -------------------------------------------------------------------------------- Activities of Daily Living Details Patient Name: Date of Service: [name] [place] [name]. 02/16/2022 8:00 [name] M Medical Record Number: [id number] Patient Account Number: [id number] Date of Birth/Sex: Treating RN: Jun 27, 1960 (61 y.o. [name], [name] Primary Care Harshita Bernales: Leanna Battles Other Clinician: Referring Nickolai Rinks: Treating Jarrius Huaracha/Extender: Darrold Junker in Treatment: 0 Activities of Daily Living Items Answer Activities of Daily Living (Please select one for each item) Drive Automobile Completely Able T Medications ake Completely Able Use T elephone Completely Able Care for Appearance Completely Able Use T oilet Completely Able Bath / Shower Completely Able Dress Self Completely Able Feed Self Completely Able Walk Completely Able Get In / Out Bed Completely Able Housework Completely Able Prepare Meals Completely [place] for Self Completely Able Electronic Signature(s) Signed: 02/16/2022 4:30:19 PM By:  Rhae Hammock RN Entered By: Rhae Hammock on 02/16/2022 08:14:12 -------------------------------------------------------------------------------- Education Screening Details Patient Name: Date of Service: [name] [name], [name] [name]. 02/16/2022 8:00 [name] M Medical Record Number: [id number] Patient Account Number: [id number] Date of Birth/Sex: Treating RN: 07-Feb-1961 (61 y.o. [name], [name] Primary Care Blu Lori: Leanna Battles Other Clinician: Referring Aune Adami: Treating Izzie Geers/Extender: Darrold Junker in Treatment: 0 Primary Learner Assessed: Patient Learning Preferences/Education Level/Primary Language Learning Preference: Explanation, Demonstration, Communication Board, Printed Material Highest Education Level: College or Above Preferred Language: English Cognitive Barrier Language Barrier: No Translator Needed: No Memory Deficit: No Emotional Barrier: No Cultural/Religious Beliefs Affecting Medical Care: No Physical Barrier Impaired Vision: Yes Glasses Impaired Hearing: No Decreased Hand dexterity: No Knowledge/Comprehension Knowledge Level: High Comprehension Level: High Ability to understand written instructions: High Ability to understand verbal instructions: High Motivation Anxiety Level: Calm Cooperation: Cooperative Education Importance: Denies Need Interest in Health Problems: Asks Questions Perception: Coherent Willingness to Engage in Self-Management High Activities: Readiness to Engage in Self-Management High Activities: Electronic Signature(s) Signed: 02/16/2022 4:30:19 PM By: Rhae Hammock RN Entered By: Rhae Hammock on 02/16/2022 08:14:38 -------------------------------------------------------------------------------- Fall Risk Assessment Details Patient Name: Date of Service: [name] [name], [name] [name]. 02/16/2022 8:00 [name] M Medical Record Number: [id number] Patient Account Number: [id number] Date of  Birth/Sex: Treating RN: [birth year] (62 y.o. [name], [name] Primary Care Deval Mroczka: Leanna Battles Other Clinician: Referring Gunnard Dorrance: Treating Keoki Mchargue/Extender: Darrold Junker in Treatment: 0 Fall Risk Assessment Items Have you had 2 or more falls in the last 12 monthso 0 No Have you had any fall that resulted in injury in the last 12 monthso 0 Yes FALLS RISK SCREEN History of falling - immediate or within 3 months 0 No Secondary diagnosis (Do you have 2 or more medical diagnoseso) 0 No Ambulatory aid None/bed rest/wheelchair/nurse 0 No Crutches/cane/walker 0 No Furniture 0 No  Intravenous therapy Access/Saline/Heparin Lock 0 No Gait/Transferring Normal/ bed rest/ wheelchair 0 No Weak (short steps with or without shuffle, stooped but able to lift head while walking, may seek 0 No support from furniture) Impaired (short steps with shuffle, may have difficulty arising from chair, head down, impaired 0 No balance) Mental Status Oriented to own ability 0 No Electronic Signature(s) Signed: 02/16/2022 4:30:19 PM By: Rhae Hammock RN Entered By: Rhae Hammock on 02/16/2022 08:14:59 -------------------------------------------------------------------------------- Foot Assessment Details Patient Name: Date of Service: [name] [name], [name] [name]. 02/16/2022 8:00 [name] M Medical Record Number: [id number] Patient Account Number: [id number] Date of Birth/Sex: Treating RN: 04/12/1961 (61 y.o. [name], [name] Primary Care Vera Wishart: Leanna Battles Other Clinician: Referring Deloss Amico: Treating Patton Rabinovich/Extender: Darrold Junker in Treatment: 0 Foot Assessment Items Site Locations + = Sensation present, - = Sensation absent, C = Callus, U = Ulcer R = Redness, W = Warmth, M = Maceration, PU = Pre-ulcerative lesion F = Fissure, S = Swelling, D = Dryness Assessment Right: Left: Other Deformity: No No Prior Foot Ulcer: No  No Prior Amputation: No No Charcot Joint: No No Ambulatory Status: Ambulatory Without Help Gait: Steady Electronic Signature(s) Signed: 02/16/2022 4:30:19 PM By: Rhae Hammock RN Entered By: Rhae Hammock on 02/16/2022 08:15:17 -------------------------------------------------------------------------------- Nutrition Risk Screening Details Patient Name: Date of Service: [name] [name], [name] [name]. 02/16/2022 8:00 [name] M Medical Record Number: [id number] Patient Account Number: [id number] Date of Birth/Sex: Treating RN: 04/24/1961 (61 y.o. [name], [name] Primary Care Special Ranes: Leanna Battles Other Clinician: Referring Tamar Lipscomb: Treating Jamill Wetmore/Extender: Darrold Junker in Treatment: 0 Height (in): Weight (lbs): Body Mass Index (BMI): Nutrition Risk Screening Items Score Screening NUTRITION RISK SCREEN: I have an illness or condition that made me change the kind and/or amount of food I eat 0 No I eat fewer than two meals per day 0 No I eat few fruits and vegetables, or milk products 0 No I have three or more drinks of beer, liquor or wine almost every day 0 No I have tooth or mouth problems that make it hard for me to eat 0 No I don't always have enough money to buy the food I need 0 No I eat alone most of the time 0 No I take three or more different prescribed or over-the-counter drugs [name] day 0 No Without wanting to, I have lost or gained 10 pounds in the last six months 0 No I am not always physically able to shop, cook and/or feed myself 0 No Nutrition Protocols Good Risk Protocol 0 No interventions needed Moderate Risk Protocol High Risk Proctocol Risk Level: Good Risk Score: 0 Electronic Signature(s) Signed: 02/16/2022 4:30:19 PM By: Rhae Hammock RN Entered By: Rhae Hammock on 02/16/2022 08:15:04

## 2022-02-16 NOTE — Chronic Care Management (AMB) (Signed)
  Care Coordination   Note   02/16/2022 Name: [name] [name] MRN: [id number] DOB: 06-22-1960  [name] [name] is a 61 y.o. year old female who sees Donnajean Lopes, MD for primary care. I reached out to Burgess Amor by phone today to reschedule  care coordination services.   Follow up plan:  Telephone appointment with care coordination team member scheduled for:  03/03/22  Encounter Outcome:  Pt. Scheduled  [place]  Direct Dial: [phone]

## 2022-02-16 NOTE — Progress Notes (Signed)
ARABELLA, REVELLE ([id number]) Visit Report for 02/16/2022 Chief Complaint Document Details Patient Name: Date of Service: [name] [name], [name] Ehlers E. 02/16/2022 8:00 [name] M Medical Record Number: [id number] Patient Account Number: [id number] Date of Birth/Sex: Treating RN: [birth year] (61 y.o. [name] [name], [name] [name] Primary Care Provider: Leanna [name] Other Clinician: Referring Provider: Treating Provider/Extender: [name] [name] in Treatment: 0 Information Obtained from: Patient Chief Complaint 02/16/2022; burn to the right foot Electronic Signature(s) Signed: 02/16/2022 4:41:45 PM By: Kalman Shan DO Entered By: Kalman Shan on 02/16/2022 09:19:05 -------------------------------------------------------------------------------- Debridement Details Patient Name: Date of Service: [name] [name], [name] 02/16/2022 8:00 [name] M Medical Record Number: [id number] Patient Account Number: [id number] Date of Birth/Sex: Treating RN: 1961/05/14 (61 y.o. [name] [name], [name] [name] Primary Care Provider: Leanna [name] Other Clinician: Referring Provider: Treating Provider/Extender: [name] [name] in Treatment: 0 Debridement Performed for Assessment: Wound #1 Right,Dorsal Foot Performed By: Physician Kalman Shan, DO Debridement Type: Debridement Level of Consciousness (Pre-procedure): Awake and Alert Pre-procedure Verification/Time Out Yes - 08:54 Taken: Start Time: 08:54 Pain Control: Lidocaine T Area Debrided (L x W): otal 10 (cm) x 6 (cm) = 60 (cm) Tissue and other material debrided: Viable, Non-Viable, Slough, Subcutaneous, Skin: Dermis , Skin: Epidermis, Slough Level: Skin/Subcutaneous Tissue Debridement Description: Excisional Instrument: Curette Bleeding: Minimum Hemostasis Achieved: Pressure End Time: 08:54 Procedural Pain: 0 Post Procedural Pain: 0 Response to Treatment: Procedure was tolerated well Level of Consciousness  (Post- Awake and Alert procedure): Post Debridement Measurements of Total Wound Length: (cm) 10 Width: (cm) 6 Depth: (cm) 0.2 Volume: (cm) 9.425 Character of Wound/Ulcer Post Debridement: Improved Post Procedure Diagnosis Same as Pre-procedure Electronic Signature(s) Signed: 02/16/2022 4:30:19 PM By: Rhae Hammock RN Signed: 02/16/2022 4:41:45 PM By: Kalman Shan DO Entered By: Rhae Hammock on 02/16/2022 08:55:09 -------------------------------------------------------------------------------- Debridement Details Patient Name: Date of Service: [name] [name], [name] 02/16/2022 8:00 [name] M Medical Record Number: [id number] Patient Account Number: [id number] Date of Birth/Sex: Treating RN: [birth year] (61 y.o. [name] [name], [name] [name] Primary Care Provider: Leanna [name] Other Clinician: Referring Provider: Treating Provider/Extender: [name] [name] in Treatment: 0 Debridement Performed for Assessment: Wound #2 Right T Second oe Performed By: Physician Kalman Shan, DO Debridement Type: Debridement Level of Consciousness (Pre-procedure): Awake and Alert Pre-procedure Verification/Time Out Yes - 08:54 Taken: Start Time: 08:54 Pain Control: Lidocaine T Area Debrided (L x W): otal 2 (cm) x 1.9 (cm) = 3.8 (cm) Tissue and other material debrided: Viable, Non-Viable, Slough, Subcutaneous, Skin: Dermis , Skin: Epidermis, Slough Level: Skin/Subcutaneous Tissue Debridement Description: Excisional Instrument: Curette Bleeding: Minimum Hemostasis Achieved: Pressure End Time: 08:54 Procedural Pain: 0 Post Procedural Pain: 0 Response to Treatment: Procedure was tolerated well Level of Consciousness (Post- Awake and Alert procedure): Post Debridement Measurements of Total Wound Length: (cm) 2 Width: (cm) 1.9 Depth: (cm) 0.2 Volume: (cm) 0.597 Character of Wound/Ulcer Post Debridement: Improved Post Procedure Diagnosis Same as  Pre-procedure Electronic Signature(s) Signed: 02/16/2022 4:30:19 PM By: Rhae Hammock RN Signed: 02/16/2022 4:41:45 PM By: Kalman Shan DO Entered By: Rhae Hammock on 02/16/2022 08:55:42 -------------------------------------------------------------------------------- HPI Details Patient Name: Date of Service: [name] [name], [name] 02/16/2022 8:00 [name] M Medical Record Number: [id number] Patient Account Number: [id number] Date of Birth/Sex: Treating RN: [birth year] (61 y.o. [name] [name], [name] [name] Primary Care Provider: Leanna [name] Other Clinician: Referring Provider: Treating Provider/Extender: [name] [name] in Treatment: 0 History of Present Illness HPI Description: Admission 02/16/2022 Ms. [name] Hail  [name] is [name] 61 year old female with [name] past medical history of hypertension and hypothyroidism that presents to the clinic for 2-week history of burn to her right foot. She states she was cooking mashed potatoes and they fell on her foot resulting in [name] burn. She states she visited her primary care physician and was started on Silvadene. She had [name] follow-up visit and was prescribed doxycycline and mupirocin. She denies signs of infection currently. She reports minimal improvement in wound healing. Electronic Signature(s) Signed: 02/16/2022 4:41:45 PM By: Kalman Shan DO Entered By: Kalman Shan on 02/16/2022 09:42:07 -------------------------------------------------------------------------------- Physical Exam Details Patient Name: Date of Service: [name], [name] 02/16/2022 8:00 [name] M Medical Record Number: [id number] Patient Account Number: [id number] Date of Birth/Sex: Treating RN: 1961/04/03 (61 y.o. [name] [name], [name] [name] Primary Care Provider: Leanna [name] Other Clinician: Referring Provider: Treating Provider/Extender: [name] [name] in Treatment: 0 Constitutional respirations regular, non-labored and  within target range for patient.. Cardiovascular 2+ dorsalis pedis/posterior tibialis pulses. Psychiatric pleasant and cooperative. Notes Right foot: T the dorsal aspect and along the second toe there is an open wound with granulation tissue and Tightly adhered nonviable tissue. Blanching noted. o No signs of infection including increased warmth, erythema or purulent drainage. Electronic Signature(s) Signed: 02/16/2022 4:41:45 PM By: Kalman Shan DO Entered By: Kalman Shan on 02/16/2022 09:43:18 -------------------------------------------------------------------------------- Physician Orders Details Patient Name: Date of Service: [name] [name], [name] 02/16/2022 8:00 [name] M Medical Record Number: [id number] Patient Account Number: [id number] Date of Birth/Sex: Treating RN: 04-21-1961 (61 y.o. [name] [name], [name] [name] Primary Care Provider: Leanna [name] Other Clinician: Referring Provider: Treating Provider/Extender: [name] [name] in Treatment: 0 Verbal / Phone Orders: No Diagnosis Coding Follow-up Appointments ppointment in 1 week. - W/ Dr. Heber  and Allayne Butcher Rm # 9 Return [name] Other: - We are giving you [name] coupon for the Santyl if it's too expensive. Anesthetic (In clinic) Topical Lidocaine 5% applied to wound bed Bathing/ Shower/ Hygiene May shower with protection but do not get wound dressing(s) wet. Edema Control - Lymphedema / SCD / Other Elevate legs to the level of the heart or above for 30 minutes daily and/or when sitting, [name] frequency of: Avoid standing for long periods of time. Wound Treatment Wound #1 - Foot Wound Laterality: Dorsal, Right Cleanser: Soap and Water 1 x Per [facility]/96 Days Discharge Instructions: May shower and wash wound with dial antibacterial soap and water prior to dressing change. Cleanser: Wound Cleanser (DME) (Generic) 1 x Per Day/15 Days Discharge Instructions: Cleanse the wound with wound cleanser prior to applying [name]  clean dressing using gauze sponges, not tissue or cotton balls. Prim Dressing: Santyl Ointment 1 x Per Day/15 Days ary Discharge Instructions: Apply nickel thick amount to wound bed as instructed Secondary Dressing: T Non-Adherent Dressing, 3x4 in (DME) (Generic) 1 x Per Day/15 Days elfa Discharge Instructions: Apply over primary dressing as directed. Secondary Dressing: Woven Gauze Sponge, Non-Sterile 4x4 in (DME) (Generic) 1 x Per Day/15 Days Discharge Instructions: Apply over primary dressing as directed. Secured With: [employer], 4.5x3.1 (in/yd) (DME) (Generic) 1 x Per Day/15 Days Discharge Instructions: Secure with Kerlix as directed. Secured With: 80M Medipore H Soft Cloth Surgical T ape, 4 x 10 (in/yd) (DME) (Generic) 1 x Per Day/15 Days Discharge Instructions: Secure with tape as directed. Secured With: [employer] Size 5, 10 (yds) (DME) (Generic) 1 x Per Day/15 Days Wound #2 - T Second oe Wound Laterality: Right Cleanser:  Soap and Water 1 x Per [facility]/74 Days Discharge Instructions: May shower and wash wound with dial antibacterial soap and water prior to dressing change. Cleanser: Wound Cleanser (DME) (Generic) 1 x Per Day/15 Days Discharge Instructions: Cleanse the wound with wound cleanser prior to applying [name] clean dressing using gauze sponges, not tissue or cotton balls. Prim Dressing: Santyl Ointment 1 x Per Day/15 Days ary Discharge Instructions: Apply nickel thick amount to wound bed as instructed Secondary Dressing: T Non-Adherent Dressing, 3x4 in (DME) (Generic) 1 x Per Day/15 Days elfa Discharge Instructions: Apply over primary dressing as directed. Secondary Dressing: Woven Gauze Sponge, Non-Sterile 4x4 in (DME) (Generic) 1 x Per Day/15 Days Discharge Instructions: Apply over primary dressing as directed. Secured With: [employer], 4.5x3.1 (in/yd) (DME) (Generic) 1 x Per Day/15 Days Discharge Instructions: Secure with Kerlix as directed. Secured With: 76M  Medipore H Soft Cloth Surgical T ape, 4 x 10 (in/yd) (DME) (Generic) 1 x Per Day/15 Days Discharge Instructions: Secure with tape as directed. Secured With: [employer] Size 5, 10 (yds) (DME) (Generic) 1 x Per Day/15 Days Patient Medications llergies: codeine [name] Notifications Medication Indication Start End 02/16/2022 lidocaine DOSE topical 5 % gel - gel topical 02/16/2022 Santyl DOSE 1 - topical 250 unit/gram ointment - Apply daily to the wound bed Electronic Signature(s) Signed: 02/16/2022 4:41:45 PM By: Kalman Shan DO Previous Signature: 02/16/2022 9:02:21 AM Version By: Kalman Shan DO Entered By: Kalman Shan on 02/16/2022 09:43:29 -------------------------------------------------------------------------------- Problem List Details Patient Name: Date of Service: [name] [name], [name] 02/16/2022 8:00 [name] M Medical Record Number: [id number] Patient Account Number: [id number] Date of Birth/Sex: Treating RN: 1960/11/12 (61 y.o. [name] [name], [name] [name] Primary Care Provider: Leanna [name] Other Clinician: Referring Provider: Treating Provider/Extender: [name] [name] in Treatment: 0 Active Problems ICD-10 Encounter Code Description Active Date MDM Diagnosis T24.231A Burn of second degree of right lower leg, initial encounter 02/16/2022 No Yes E03.9 Hypothyroidism, unspecified 02/16/2022 No Yes Inactive Problems Resolved Problems Electronic Signature(s) Signed: 02/16/2022 4:41:45 PM By: Kalman Shan DO Entered By: Kalman Shan on 02/16/2022 09:18:30 -------------------------------------------------------------------------------- Progress Note Details Patient Name: Date of Service: [name] [name], [name] 02/16/2022 8:00 [name] M Medical Record Number: [id number] Patient Account Number: [id number] Date of Birth/Sex: Treating RN: 1961/01/16 (61 y.o. [name] [name], [name] [name] Primary Care Provider: Leanna [name] Other Clinician: Referring  Provider: Treating Provider/Extender: [name] [name] in Treatment: 0 Subjective Chief Complaint Information obtained from Patient 02/16/2022; burn to the right foot History of Present Illness (HPI) Admission 02/16/2022 Ms. Kristyn Obyrne is [name] 61 year old female with [name] past medical history of hypertension and hypothyroidism that presents to the clinic for 2-week history of burn to her right foot. She states she was cooking mashed potatoes and they fell on her foot resulting in [name] burn. She states she visited her primary care physician and was started on Silvadene. She had [name] follow-up visit and was prescribed doxycycline and mupirocin. She denies signs of infection currently. She reports minimal improvement in wound healing. Patient History Information obtained from Patient, Chart. Allergies codeine Family History Unknown History. Social History Current every day smoker, Marital Status - Married, Alcohol Use - Moderate, Drug Use - No History, Caffeine Use - Moderate. Medical History Cardiovascular Patient has history of Hypertension Medical [name] Surgical History Notes nd Gastrointestinal GERD Endocrine thyroid disease Review of Systems (ROS) Constitutional Symptoms (General Health) Denies complaints or symptoms of Fatigue, Fever, Chills, Marked Weight Change. Eyes Denies  complaints or symptoms of Dry Eyes, Vision Changes, Glasses / Contacts. Ear/Nose/Mouth/Throat Denies complaints or symptoms of Chronic sinus problems or rhinitis. Respiratory Denies complaints or symptoms of Chronic or frequent coughs, Shortness of Breath. Gastrointestinal Denies complaints or symptoms of Frequent diarrhea, Nausea, Vomiting. Endocrine Denies complaints or symptoms of Heat/cold intolerance. Genitourinary Denies complaints or symptoms of Frequent urination. Integumentary (Skin) Complains or has symptoms of Wounds. Musculoskeletal Denies complaints or symptoms of  Muscle Pain, Muscle Weakness. Neurologic Denies complaints or symptoms of Numbness/parasthesias. Psychiatric Denies complaints or symptoms of Claustrophobia. Objective Constitutional respirations regular, non-labored and within target range for patient.. Vitals Time Taken: 8:16 AM, Height: 62 in, Source: Stated, Weight: 110 lbs, Source: Stated, BMI: 20.1, Temperature: 98.6 F, Pulse: 81 bpm, Respiratory Rate: 17 breaths/min, Blood Pressure: 148/89 mmHg. Cardiovascular 2+ dorsalis pedis/posterior tibialis pulses. Psychiatric pleasant and cooperative. General Notes: Right foot: T the dorsal aspect and along the second toe there is an open wound with granulation tissue and Tightly adhered nonviable tissue. o Blanching noted. No signs of infection including increased warmth, erythema or purulent drainage. Integumentary (Hair, Skin) Wound #1 status is Open. Original cause of wound was Thermal Burn. The date acquired was: 02/02/2022. The wound is located on the Right,Dorsal Foot. The wound measures 10cm length x 6cm width x 0.2cm depth; 47.124cm^2 area and 9.425cm^3 volume. There is no tunneling or undermining noted. There is [name] medium amount of serosanguineous drainage noted. The wound margin is distinct with the outline attached to the wound base. There is small (1-33%) red, pink granulation within the wound bed. There is [name] large (67-100%) amount of necrotic tissue within the wound bed including Eschar and Adherent Slough. The periwound skin appearance did not exhibit: Callus, Crepitus, Excoriation, Induration, Rash, Scarring, Dry/Scaly, Maceration, Atrophie Blanche, Cyanosis, Ecchymosis, Hemosiderin Staining, Mottled, Pallor, Rubor, Erythema. Periwound temperature was noted as No Abnormality. The periwound has tenderness on palpation. Wound #2 status is Open. Original cause of wound was Thermal Burn. The date acquired was: 02/02/2022. The wound is located on the Right T Second. The oe wound  measures 2cm length x 1.9cm width x 0.2cm depth; 2.985cm^2 area and 0.597cm^3 volume. There is Fat Layer (Subcutaneous Tissue) exposed. There is no tunneling or undermining noted. There is [name] medium amount of serosanguineous drainage noted. The wound margin is distinct with the outline attached to the wound base. There is no granulation within the wound bed. There is [name] large (67-100%) amount of necrotic tissue within the wound bed including Eschar and Adherent Slough. The periwound skin appearance did not exhibit: Callus, Crepitus, Excoriation, Induration, Rash, Scarring, Dry/Scaly, Maceration, Atrophie Blanche, Cyanosis, Ecchymosis, Hemosiderin Staining, Mottled, Pallor, Rubor, Erythema. Periwound temperature was noted as No Abnormality. The periwound has tenderness on palpation. Assessment Active Problems ICD-10 Burn of second degree of right lower leg, initial encounter Hypothyroidism, unspecified Patient presents with [name] second-degree burn secondary to dropping steaming mashed potatoes on her right foot. I debrided nonviable tissue. At this time she would benefit greatly from [facility]. We gave her [name] coupon card to help with cost. I asked her to call us with any questions or concerns. If she cannot afford the Santyl I recommended going back to Silvadene. No signs of infection on exam. She is currently on doxycycline and she can continue this. Follow-up in 1 week. 46 minutes is spent on the encounter including face-to-face, EMR review and coronation of care Procedures Wound #1 Pre-procedure diagnosis of Wound #1 is [name] 3rd degree Burn located on the Right,Dorsal  Foot . There was [name] Excisional Skin/Subcutaneous Tissue Debridement with [name] total area of 60 sq cm performed by Kalman Shan, DO. With the following instrument(s): Curette to remove Viable and Non-Viable tissue/material. Material removed includes Subcutaneous Tissue, Slough, Skin: Dermis, and Skin: Epidermis after achieving pain control  using Lidocaine. No specimens were taken. [name] time out was conducted at 08:54, prior to the start of the procedure. [name] Minimum amount of bleeding was controlled with Pressure. The procedure was tolerated well with [name] pain level of 0 throughout and [name] pain level of 0 following the procedure. Post Debridement Measurements: 10cm length x 6cm width x 0.2cm depth; 9.425cm^3 volume. Character of Wound/Ulcer Post Debridement is improved. Post procedure Diagnosis Wound #1: Same as Pre-Procedure Wound #2 Pre-procedure diagnosis of Wound #2 is [name] 3rd degree Burn located on the Right T Second . There was [name] Excisional Skin/Subcutaneous Tissue Debridement oe with [name] total area of 3.8 sq cm performed by Kalman Shan, DO. With the following instrument(s): Curette to remove Viable and Non-Viable tissue/material. Material removed includes Subcutaneous Tissue, Slough, Skin: Dermis, and Skin: Epidermis after achieving pain control using Lidocaine. No specimens were taken. [name] time out was conducted at 08:54, prior to the start of the procedure. [name] Minimum amount of bleeding was controlled with Pressure. The procedure was tolerated well with [name] pain level of 0 throughout and [name] pain level of 0 following the procedure. Post Debridement Measurements: 2cm length x 1.9cm width x 0.2cm depth; 0.597cm^3 volume. Character of Wound/Ulcer Post Debridement is improved. Post procedure Diagnosis Wound #2: Same as Pre-Procedure Plan Follow-up Appointments: Return Appointment in 1 week. - W/ Dr. Heber [place] and Allayne Butcher Rm # 9 Other: - We are giving you [name] coupon for the Santyl if it's too expensive. Anesthetic: (In clinic) Topical Lidocaine 5% applied to wound bed Bathing/ Shower/ Hygiene: May shower with protection but do not get wound dressing(s) wet. Edema Control - Lymphedema / SCD / Other: Elevate legs to the level of the heart or above for 30 minutes daily and/or when sitting, [name] frequency of: Avoid standing for long periods of  time. The following medication(s) was prescribed: lidocaine topical 5 % gel gel topical was prescribed at facility Santyl topical 250 unit/gram ointment 1 Apply daily to the wound bed starting 02/16/2022 WOUND #1: - Foot Wound Laterality: Dorsal, Right Cleanser: Soap and Water 1 x Per Day/15 Days Discharge Instructions: May shower and wash wound with dial antibacterial soap and water prior to dressing change. Cleanser: Wound Cleanser (DME) (Generic) 1 x Per Day/15 Days Discharge Instructions: Cleanse the wound with wound cleanser prior to applying [name] clean dressing using gauze sponges, not tissue or cotton balls. Prim Dressing: Santyl Ointment 1 x Per Day/15 Days ary Discharge Instructions: Apply nickel thick amount to wound bed as instructed Secondary Dressing: T Non-Adherent Dressing, 3x4 in (DME) (Generic) 1 x Per Day/15 Days elfa Discharge Instructions: Apply over primary dressing as directed. Secondary Dressing: Woven Gauze Sponge, Non-Sterile 4x4 in (DME) (Generic) 1 x Per Day/15 Days Discharge Instructions: Apply over primary dressing as directed. Secured With: [employer], 4.5x3.1 (in/yd) (DME) (Generic) 1 x Per Day/15 Days Discharge Instructions: Secure with Kerlix as directed. Secured With: 24M [employer]ipore H Soft Cloth Surgical T ape, 4 x 10 (in/yd) (DME) (Generic) 1 x Per Day/15 Days Discharge Instructions: Secure with tape as directed. Secured With: [employer] Size 5, 10 (yds) (DME) (Generic) 1 x Per Day/15 Days WOUND #2: - T Second Wound Laterality: Right oe Cleanser:  Soap and Water 1 x Per [facility]/93 Days Discharge Instructions: May shower and wash wound with dial antibacterial soap and water prior to dressing change. Cleanser: Wound Cleanser (DME) (Generic) 1 x Per Day/15 Days Discharge Instructions: Cleanse the wound with wound cleanser prior to applying [name] clean dressing using gauze sponges, not tissue or cotton balls. Prim Dressing: Santyl Ointment 1 x Per Day/15  Days ary Discharge Instructions: Apply nickel thick amount to wound bed as instructed Secondary Dressing: T Non-Adherent Dressing, 3x4 in (DME) (Generic) 1 x Per Day/15 Days elfa Discharge Instructions: Apply over primary dressing as directed. Secondary Dressing: Woven Gauze Sponge, Non-Sterile 4x4 in (DME) (Generic) 1 x Per Day/15 Days Discharge Instructions: Apply over primary dressing as directed. Secured With: [employer], 4.5x3.1 (in/yd) (DME) (Generic) 1 x Per Day/15 Days Discharge Instructions: Secure with Kerlix as directed. Secured With: 32M [employer]ipore H Soft Cloth Surgical T ape, 4 x 10 (in/yd) (DME) (Generic) 1 x Per Day/15 Days Discharge Instructions: Secure with tape as directed. Secured With: [employer] Size 5, 10 (yds) (DME) (Generic) 1 x Per Day/15 Days 1. In office sharp debridement 2. Santyl daily 3. Follow-up in 1 week Electronic Signature(s) Signed: 02/16/2022 4:41:45 PM By: Kalman Shan DO Entered By: Kalman Shan on 02/16/2022 09:45:27 -------------------------------------------------------------------------------- HxROS Details Patient Name: Date of Service: [name] [name], [name] 02/16/2022 8:00 [name] M Medical Record Number: [id number] Patient Account Number: [id number] Date of Birth/Sex: Treating RN: 01-07-1961 (61 y.o. [name] [name], [name] [name] Primary Care Provider: Leanna [name] Other Clinician: Referring Provider: Treating Provider/Extender: [name] [name] in Treatment: 0 Information Obtained From Patient Chart Constitutional Symptoms (General Health) Complaints and Symptoms: Negative for: Fatigue; Fever; Chills; Marked Weight Change Eyes Complaints and Symptoms: Negative for: Dry Eyes; Vision Changes; Glasses / Contacts Ear/Nose/Mouth/Throat Complaints and Symptoms: Negative for: Chronic sinus problems or rhinitis Respiratory Complaints and Symptoms: Negative for: Chronic or frequent coughs; Shortness of  Breath Gastrointestinal Complaints and Symptoms: Negative for: Frequent diarrhea; Nausea; Vomiting Medical History: Past Medical History Notes: GERD Endocrine Complaints and Symptoms: Negative for: Heat/cold intolerance Medical History: Past Medical History Notes: thyroid disease Genitourinary Complaints and Symptoms: Negative for: Frequent urination Integumentary (Skin) Complaints and Symptoms: Positive for: Wounds Musculoskeletal Complaints and Symptoms: Negative for: Muscle Pain; Muscle Weakness Neurologic Complaints and Symptoms: Negative for: Numbness/parasthesias Psychiatric Complaints and Symptoms: Negative for: Claustrophobia Hematologic/Lymphatic Cardiovascular Medical History: Positive for: Hypertension Immunological Oncologic Immunizations Pneumococcal Vaccine: Received Pneumococcal Vaccination: No Implantable Devices None Family and Social History Unknown History: Yes; Current every day smoker; Marital Status - Married; Alcohol Use: Moderate; Drug Use: No History; Caffeine Use: Moderate; Financial Concerns: No;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

## 2022-02-18 NOTE — Progress Notes (Signed)
[name] [name] ([id number]) Visit Report for 02/16/2022 Allergy List Details Patient Name: Date of Service: Loma Sender [name] [name], [name] Hollie Salk [name]. 02/16/2022 8:00 [name] [name] Medical Record Number: [id number] Patient Account Number: [id number] Date of Birth/Sex: Treating RN: July 07, 1960 (61 y.o. [name] [name], [name] [name] Primary Care Tan Clopper: [name] [name] Other Clinician: Referring Rolando Whitby: Treating Zakariye Nee/Extender: Darrold Junker in Treatment: 0 Allergies Active Allergies codeine Allergy Notes Electronic Signature(s) Signed: 02/16/2022 4:30:19 PM By: Rhae Hammock RN Entered By: Rhae Hammock on 02/16/2022 08:13:49 -------------------------------------------------------------------------------- Arrival Information Details Patient Name: Date of Service: [name] [name], [name] [name]. 02/16/2022 8:00 [name] [name] Medical Record Number: [id number] Patient Account Number: [id number] Date of Birth/Sex: Treating RN: 20-Sep-1960 (61 y.o. [name] [name], [name] [name] Primary Care Sayyid Harewood: [name] [name] Other Clinician: Referring Basil Buffin: Treating Boen Sterbenz/Extender: Darrold Junker in Treatment: 0 Visit Information Patient Arrived: Ambulatory Arrival Time: 08:11 Accompanied By: self Transfer Assistance: None Patient Identification Verified: Yes Secondary Verification Process Completed: Yes Patient Requires Transmission-Based Precautions: No Patient Has Alerts: No Electronic Signature(s) Signed: 02/16/2022 4:30:19 PM By: Rhae Hammock RN Entered By: Rhae Hammock on 02/16/2022 08:12:04 -------------------------------------------------------------------------------- Clinic Level of Care Assessment Details Patient Name: Date of Service: BRA [name] [name], [name] [name]. 02/16/2022 8:00 [name] [name] Medical Record Number: [id number] Patient Account Number: [id number] Date of Birth/Sex: Treating RN: [birth year] (61 y.o. [name] [name], [name] [name] Primary Care Inara Dike: [name] [name] Other Clinician: Referring [name] [name]: Treating Camay Pedigo/Extender: Darrold Junker in Treatment: 0 Clinic Level of Care Assessment Items TOOL 3 Quantity Score X- 1 0 Use when EandM and Procedure is performed on FOLLOW-UP visit ASSESSMENTS - Nursing Assessment / Reassessment X- 1 10 Reassessment of Co-morbidities (includes updates in patient status) X- 1 5 Reassessment of Adherence to Treatment Plan ASSESSMENTS - Wound and Skin Assessment / Reassessment '[]'$  - Points for Wound Assessment can only be taken for [name] new wound of unknown or different etiology and [name] procedure is 0 NOT performed to that wound '[]'$  - 0 Simple Wound Assessment / Reassessment - one wound X- 2 5 Complex Wound Assessment / Reassessment - multiple wounds '[]'$  - 0 Dermatologic / Skin Assessment (not related to wound area) ASSESSMENTS - Focused Assessment X- 1 5 Circumferential Edema Measurements - multi extremities '[]'$  - 0 Nutritional Assessment / Counseling / Intervention '[]'$  - 0 Lower Extremity Assessment (monofilament, tuning fork, pulses) '[]'$  - 0 Peripheral Arterial Disease Assessment (using hand held doppler) ASSESSMENTS - Ostomy and/or Continence Assessment and Care '[]'$  - 0 Incontinence Assessment and Management '[]'$  - 0 Ostomy Care Assessment and Management (repouching, etc.) PROCESS - Coordination of Care '[]'$  - Points for Discharge Coordination can only be taken for [name] new wound of unknown or different etiology and [name] procedure 0 is NOT performed to that wound X- 1 15 Simple Patient / Family Education for ongoing care '[]'$  - 0 Complex (extensive) Patient / Family Education for ongoing care X- 1 10 Staff obtains Programmer, systems, Records, T Results / Process Orders est '[]'$  - 0 Staff telephones HHA, Nursing Homes / Clarify orders / etc '[]'$  - 0 Routine Transfer to another Facility (non-emergent condition) '[]'$  - 0 Routine Hospital Admission (non-emergent condition) X- 1 15 New Admissions  / Biomedical engineer / Ordering NPWT Apligraf, etc. , '[]'$  - 0 Emergency Hospital Admission (emergent condition) X- 1 10 Simple Discharge Coordination '[]'$  - 0 Complex (extensive) Discharge Coordination PROCESS - Special Needs '[]'$  - 0 Pediatric / Minor Patient Management '[]'$  - 0 Isolation Patient Management '[]'$  -  0 Hearing / Language / Visual special needs '[]'$  - 0 Assessment of Community assistance (transportation, D/C planning, etc.) '[]'$  - 0 Additional assistance / Altered mentation '[]'$  - 0 Support Surface(s) Assessment (bed, cushion, seat, etc.) INTERVENTIONS - Wound Cleansing / Measurement '[]'$  - Points for Wound Cleaning / Measurement, Wound Dressing, Specimen Collection and Specimen taken to lab can only 0 be taken for [name] new wound of unknown or different etiology and [name] procedure is NOT performed to that wound '[]'$  - 0 Simple Wound Cleansing - one wound X- 2 5 Complex Wound Cleansing - multiple wounds '[]'$  - 0 Wound Imaging (photographs - any number of wounds) X- 1 5 Wound Tracing (instead of photographs) '[]'$  - 0 Simple Wound Measurement - one wound X- 2 5 Complex Wound Measurement - multiple wounds INTERVENTIONS - Wound Dressings X - Small Wound Dressing one or multiple wounds 1 10 '[]'$  - 0 Medium Wound Dressing one or multiple wounds '[]'$  - 0 Large Wound Dressing one or multiple wounds INTERVENTIONS - Miscellaneous '[]'$  - 0 External ear exam '[]'$  - 0 Specimen Collection (cultures, biopsies, blood, body fluids, etc.) '[]'$  - 0 Specimen(s) / Culture(s) sent or taken to Lab for analysis '[]'$  - 0 Patient Transfer (multiple staff / Civil Service fast streamer / Similar devices) '[]'$  - 0 Simple Staple / Suture removal (25 or less) '[]'$  - 0 Complex Staple / Suture removal (26 or more) '[]'$  - 0 Hypo / Hyperglycemic Management (close monitor of Blood Glucose) X- 1 15 Ankle / Brachial Index (ABI) - do not check if billed separately X- 1 5 Vital Signs Has the patient been seen at the hospital within the  last three years: Yes Total Score: 135 Level Of Care: New/Established - Level 4 Electronic Signature(s) Signed: 02/16/2022 4:30:19 PM By: Rhae Hammock RN Entered By: Rhae Hammock on 02/16/2022 09:17:16 -------------------------------------------------------------------------------- Encounter Discharge Information Details Patient Name: Date of Service: [name] [name], [name] [name]. 02/16/2022 8:00 [name] [name] Medical Record Number: [id number] Patient Account Number: [id number] Date of Birth/Sex: Treating RN: [birth year] (61 y.o. [name] [name], [name] [name] Primary Care Edit Ricciardelli: [name] [name] Other Clinician: Referring Clarence Dunsmore: Treating Liyanna Cartwright/Extender: Darrold Junker in Treatment: 0 Encounter Discharge Information Items Post Procedure Vitals Discharge Condition: Stable Temperature (F): 98.7 Ambulatory Status: Ambulatory Pulse (bpm): 74 Discharge Destination: Home Respiratory Rate (breaths/min): 17 Transportation: Private Auto Blood Pressure (mmHg): 140/74 Accompanied By: self Schedule Follow-up Appointment: Yes Clinical Summary of Care: Patient Declined Electronic Signature(s) Signed: 02/16/2022 4:30:19 PM By: Rhae Hammock RN Entered By: Rhae Hammock on 02/16/2022 09:18:30 -------------------------------------------------------------------------------- Lower Extremity Assessment Details Patient Name: Date of Service: [name] [name], [name] [name]. 02/16/2022 8:00 [name] [name] Medical Record Number: [id number] Patient Account Number: [id number] Date of Birth/Sex: Treating RN: 03-23-1961 (61 y.o. [name] [name], [name] [name] Primary Care Genie Mirabal: [name] [name] Other Clinician: Referring Duyen Beckom: Treating Maciah Feeback/Extender: Darrold Junker in Treatment: 0 Edema Assessment Assessed: Shirlyn Goltz: No] Patrice Paradise: Yes] Edema: [Left: N] [Right: o] Vascular Assessment Pulses: Dorsalis Pedis Palpable: [Right:Yes] Posterior Tibial Palpable:  [Right:Yes] Blood Pressure: Brachial: [Right:145] Ankle: [Right:Dorsalis Pedis: 178 1.23] Electronic Signature(s) Signed: 02/16/2022 4:30:19 PM By: Rhae Hammock RN Entered By: Rhae Hammock on 02/16/2022 08:27:30 -------------------------------------------------------------------------------- Multi Wound Chart Details Patient Name: Date of Service: [name] [name], [name] [name]. 02/16/2022 8:00 [name] [name] Medical Record Number: [id number] Patient Account Number: [id number] Date of Birth/Sex: Treating RN: July 26, 1960 (61 y.o. [name] [name], [name] [name] Primary Care Georgiann Neider: [name] [name] Other Clinician: Referring Phala Schraeder: Treating Bev Drennen/Extender: Thomes Dinning,  Lincoln Maxin in Treatment: 0 Vital Signs Height(in): 62 Pulse(bpm): 28 Weight(lbs): 110 Blood Pressure(mmHg): 148/89 Body Mass Index(BMI): 20.1 Temperature(F): 98.6 Respiratory Rate(breaths/min): 17 Photos: [N/[name]:N/[name]] Right, Dorsal Foot Right T Second oe N/[name] Wound Location: Thermal Burn Thermal Burn N/[name] Wounding Event: 3rd degree Burn 3rd degree Burn N/[name] Primary Etiology: Hypertension Hypertension N/[name] Comorbid History: 02/02/2022 02/02/2022 N/[name] Date Acquired: 0 0 N/[name] Weeks of Treatment: Open Open N/[name] Wound Status: No No N/[name] Wound Recurrence: Yes No N/[name] Clustered Wound: 2 N/[name] N/[name] Clustered Quantity: 10x6x0.2 2x1.9x0.2 N/[name] Measurements L x W x D (cm) 47.124 2.985 N/[name] [name] (cm) : rea 9.425 0.597 N/[name] Volume (cm) : Full Thickness With Exposed Support Full Thickness With Exposed Support N/[name] Classification: Structures Structures Medium Medium N/[name] Exudate [name] mount: Serosanguineous Serosanguineous N/[name] Exudate Type: red, brown red, brown N/[name] Exudate Color: Distinct, outline attached Distinct, outline attached N/[name] Wound Margin: Small (1-33%) None Present (0%) N/[name] Granulation [name] mount: Red, Pink N/[name] N/[name] Granulation Quality: Large (67-100%) Large (67-100%) N/[name] Necrotic [name] mount: Eschar, Adherent  Slough Eschar, Adherent Slough N/[name] Necrotic Tissue: Fascia: No Fat Layer (Subcutaneous Tissue): Yes N/[name] Exposed Structures: Fat Layer (Subcutaneous Tissue): No Fascia: No Tendon: No Tendon: No Muscle: No Muscle: No Joint: No Joint: No Bone: No Bone: No None None N/[name] Epithelialization: Debridement - Excisional Debridement - Excisional N/[name] Debridement: Pre-procedure Verification/Time Out 08:54 08:54 N/[name] Taken: Lidocaine Lidocaine N/[name] Pain Control: Subcutaneous, Slough Subcutaneous, Slough N/[name] Tissue Debrided: Skin/Subcutaneous Tissue Skin/Subcutaneous Tissue N/[name] Level: 60 3.8 N/[name] Debridement [name] (sq cm): rea Curette Curette N/[name] Instrument: Minimum Minimum N/[name] Bleeding: Pressure Pressure N/[name] Hemostasis [name] chieved: 0 0 N/[name] Procedural Pain: 0 0 N/[name] Post Procedural Pain: Procedure was tolerated well Procedure was tolerated well N/[name] Debridement Treatment Response: 10x6x0.2 2x1.9x0.2 N/[name] Post Debridement Measurements L x W x D (cm) 9.425 0.597 N/[name] Post Debridement Volume: (cm) Excoriation: No Excoriation: No N/[name] Periwound Skin Texture: Induration: No Induration: No Callus: No Callus: No Crepitus: No Crepitus: No Rash: No Rash: No Scarring: No Scarring: No Maceration: No Maceration: No N/[name] Periwound Skin Moisture: Dry/Scaly: No Dry/Scaly: No Atrophie Blanche: No Atrophie Blanche: No N/[name] Periwound Skin Color: Cyanosis: No Cyanosis: No Ecchymosis: No Ecchymosis: No Erythema: No Erythema: No Hemosiderin Staining: No Hemosiderin Staining: No Mottled: No Mottled: No Pallor: No Pallor: No Rubor: No Rubor: No No Abnormality No Abnormality N/[name] Temperature: Yes Yes N/[name] Tenderness on Palpation: Debridement Debridement N/[name] Procedures Performed: Treatment Notes Wound #1 (Foot) Wound Laterality: Dorsal, Right Cleanser Soap and Water Discharge Instruction: May shower and wash wound with dial antibacterial soap and water prior to dressing  change. Wound Cleanser Discharge Instruction: Cleanse the wound with wound cleanser prior to applying [name] clean dressing using gauze sponges, not tissue or cotton balls. Peri-Wound Care Topical Primary Dressing Santyl Ointment Discharge Instruction: Apply nickel thick amount to wound bed as instructed Secondary Dressing T Non-Adherent Dressing, 3x4 in elfa Discharge Instruction: Apply over primary dressing as directed. Woven Gauze Sponge, Non-Sterile 4x4 in Discharge Instruction: Apply over primary dressing as directed. Secured With [employer], 4.5x3.1 (in/yd) Discharge Instruction: Secure with Kerlix as directed. 54M Medipore H Soft Cloth Surgical T ape, 4 x 10 (in/yd) Discharge Instruction: Secure with tape as directed. Stretch Net Size 5, 10 (yds) Compression Wrap Compression Stockings Add-Ons Wound #2 (Toe Second) Wound Laterality: Right Cleanser Soap and Water Discharge Instruction: May shower and wash wound with dial antibacterial soap and water prior to dressing change. Wound Cleanser Discharge Instruction: Cleanse the wound with  wound cleanser prior to applying [name] clean dressing using gauze sponges, not tissue or cotton balls. Peri-Wound Care Topical Primary Dressing Santyl Ointment Discharge Instruction: Apply nickel thick amount to wound bed as instructed Secondary Dressing T Non-Adherent Dressing, 3x4 in elfa Discharge Instruction: Apply over primary dressing as directed. Woven Gauze Sponge, Non-Sterile 4x4 in Discharge Instruction: Apply over primary dressing as directed. Secured With [employer], 4.5x3.1 (in/yd) Discharge Instruction: Secure with Kerlix as directed. 42M Medipore H Soft Cloth Surgical T ape, 4 x 10 (in/yd) Discharge Instruction: Secure with tape as directed. Stretch Net Size 5, 10 (yds) Compression Wrap Compression Stockings Add-Ons Electronic Signature(s) Signed: 02/16/2022 4:30:19 PM By: Rhae Hammock RN Signed:  02/16/2022 4:41:45 PM By: Kalman Shan DO Entered By: Kalman Shan on 02/16/2022 09:18:36 -------------------------------------------------------------------------------- Multi-Disciplinary Care Plan Details Patient Name: Date of Service: [name] [name], [name] [name]. 02/16/2022 8:00 [name] [name] Medical Record Number: [id number] Patient Account Number: [id number] Date of Birth/Sex: Treating RN: [birth year] (61 y.o. [name] [name], [name] [name] Primary Care Gwenivere Hiraldo: [name] [name] Other Clinician: Referring Aloise Copus: Treating Zanden Colver/Extender: Darrold Junker in Treatment: 0 Active Inactive Orientation to the Wound Care Program Nursing Diagnoses: Knowledge deficit related to the wound healing center program Goals: Patient/caregiver will verbalize understanding of the [place] Date Initiated: 02/16/2022 Target Resolution Date: 03/20/2022 Goal Status: Active Interventions: Provide education on orientation to the wound center Notes: Wound/Skin Impairment Nursing Diagnoses: Impaired tissue integrity Knowledge deficit related to smoking impact on wound healing Knowledge deficit related to ulceration/compromised skin integrity Goals: Patient will demonstrate [name] reduced rate of smoking or cessation of smoking Date Initiated: 02/16/2022 Target Resolution Date: 03/20/2022 Goal Status: Active Patient will have [name] decrease in wound volume by X% from date: (specify in notes) Date Initiated: 02/16/2022 Target Resolution Date: 03/20/2022 Goal Status: Active Patient/caregiver will verbalize understanding of skin care regimen Date Initiated: 02/16/2022 Target Resolution Date: 03/20/2022 Goal Status: Active Interventions: Assess patient/caregiver ability to obtain necessary supplies Assess patient/caregiver ability to perform ulcer/skin care regimen upon admission and as needed Assess ulceration(s) every visit Provide education on smoking Notes: Electronic  Signature(s) Signed: 02/16/2022 4:30:19 PM By: Rhae Hammock RN Entered By: Rhae Hammock on 02/16/2022 08:53:32 -------------------------------------------------------------------------------- Pain Assessment Details Patient Name: Date of Service: [name] [name], [name] [name]. 02/16/2022 8:00 [name] [name] Medical Record Number: [id number] Patient Account Number: [id number] Date of Birth/Sex: Treating RN: April 20, 1961 (61 y.o. [name] [name], [name] [name] Primary Care Dimetri Armitage: [name] [name] Other Clinician: Referring Silvino Selman: Treating Odessa Morren/Extender: Darrold Junker in Treatment: 0 Active Problems Location of Pain Severity and Description of Pain Patient Has Paino Yes Site Locations Pain Location: Pain Location: Generalized Pain, Pain in Ulcers With Dressing Change: Yes Rate the pain. Current Pain Level: 5 Worst Pain Level: 10 Least Pain Level: 0 Tolerable Pain Level: 5 Character of Pain Describe the Pain: Aching Pain Management and Medication Current Pain Management: Medication: No Cold Application: No Rest: No Massage: No Activity: No T.[name].N.S.: No Heat Application: No Leg drop or elevation: No Is the Current Pain Management Adequate: Adequate How does your wound impact your activities of daily livingo Sleep: No Bathing: No Appetite: No Relationship With Others: No Bladder Continence: No Emotions: No Bowel Continence: No Work: No Toileting: No Drive: No Dressing: No Hobbies: No Electronic Signature(s) Signed: 02/16/2022 4:30:19 PM By: Rhae Hammock RN Entered By: Rhae Hammock on 02/16/2022 08:15:41 -------------------------------------------------------------------------------- Patient/Caregiver Education Details Patient Name: Date of Service: BRA [name] [name], [name] [name]. 10/3/2023andnbsp8:00  [name] [name] Medical Record Number: [id number] Patient Account Number: [id number] Date of Birth/Gender: Treating RN: [birth year] (61 y.o. [name] [name],  [name] [name] Primary Care Physician: [name] [name] Other Clinician: Referring Physician: Treating Physician/Extender: Darrold Junker in Treatment: 0 Education Assessment Education Provided To: Patient Education Topics Provided Smoking and Wound Healing: Methods: Explain/Verbal Responses: Reinforcements needed, State content correctly [place]: o Methods: Explain/Verbal Responses: Reinforcements needed, State content correctly Electronic Signature(s) Signed: 02/16/2022 4:30:19 PM By: Rhae Hammock RN Entered By: Rhae Hammock on 02/16/2022 08:53:44 -------------------------------------------------------------------------------- Wound Assessment Details Patient Name: Date of Service: [name] [name], [name] [name]. 02/16/2022 8:00 [name] [name] Medical Record Number: [id number] Patient Account Number: [id number] Date of Birth/Sex: Treating RN: [birth year] (61 y.o. [name] [name], [name] [name] Primary Care Inaara Tye: [name] [name] Other Clinician: Referring Ismael Karge: Treating Newton Frutiger/Extender: Darrold Junker in Treatment: 0 Wound Status Wound Number: 1 Primary Etiology: 3rd degree Burn Wound Location: Right, Dorsal Foot Wound Status: Open Wounding Event: Thermal Burn Comorbid History: Hypertension Date Acquired: 02/02/2022 Weeks Of Treatment: 0 Clustered Wound: Yes Photos Wound Measurements Length: (cm) 10 Width: (cm) 6 Depth: (cm) 0.2 Clustered Quantity: 2 Area: (cm) 47.124 Volume: (cm) 9.425 % Reduction in Area: % Reduction in Volume: Epithelialization: None Tunneling: No Undermining: No Wound Description Classification: Full Thickness With Exposed Support Structures Wound Margin: Distinct, outline attached Exudate Amount: Medium Exudate Type: Serosanguineous Exudate Color: red, brown Foul Odor After Cleansing: No Slough/Fibrino Yes Wound Bed Granulation Amount: Small (1-33%) Exposed  Structure Granulation Quality: Red, Pink Fascia Exposed: No Necrotic Amount: Large (67-100%) Fat Layer (Subcutaneous Tissue) Exposed: No Necrotic Quality: Eschar, Adherent Slough Tendon Exposed: No Muscle Exposed: No Joint Exposed: No Bone Exposed: No Periwound Skin Texture Texture Color No Abnormalities Noted: No No Abnormalities Noted: No Callus: No Atrophie Blanche: No Crepitus: No Cyanosis: No Excoriation: No Ecchymosis: No Induration: No Erythema: No Rash: No Hemosiderin Staining: No Scarring: No Mottled: No Pallor: No Moisture Rubor: No No Abnormalities Noted: No Dry / Scaly: No Temperature / Pain Maceration: No Temperature: No Abnormality Tenderness on Palpation: Yes Electronic Signature(s) Signed: 02/16/2022 4:30:19 PM By: Rhae Hammock RN Signed: 02/18/2022 5:16:03 PM By: Deon Pilling RN, BSN Entered By: Deon Pilling on 02/16/2022 08:32:46 -------------------------------------------------------------------------------- Wound Assessment Details Patient Name: Date of Service: [name] [name], [name] [name]. 02/16/2022 8:00 [name] [name] Medical Record Number: [id number] Patient Account Number: [id number] Date of Birth/Sex: Treating RN: 1960/07/28 (61 y.o. [name] [name], [name] [name] Primary Care Bodie Abernethy: [name] [name] Other Clinician: Referring Juniel Groene: Treating Lakeitha Basques/Extender: Darrold Junker in Treatment: 0 Wound Status Wound Number: 2 Primary Etiology: 3rd degree Burn Wound Location: Right T Second oe Wound Status: Open Wounding Event: Thermal Burn Comorbid History: Hypertension Date Acquired: 02/02/2022 Weeks Of Treatment: 0 Clustered Wound: No Photos Wound Measurements Length: (cm) 2 Width: (cm) 1.9 Depth: (cm) 0.2 Area: (cm) 2.985 Volume: (cm) 0.597 % Reduction in Area: % Reduction in Volume: Epithelialization: None Tunneling: No Undermining: No Wound Description Classification: Full Thickness With Exposed Support  Structures Wound Margin: Distinct, outline attached Exudate Amount: Medium Exudate Type: Serosanguineous Exudate Color: red, brown Foul Odor After Cleansing: No Slough/Fibrino Yes Wound Bed Granulation Amount: None Present (0%) Exposed Structure Necrotic Amount: Large (67-100%) Fascia Exposed: No Necrotic Quality: Eschar, Adherent Slough Fat Layer (Subcutaneous Tissue) Exposed: Yes Tendon Exposed: No Muscle Exposed: No Joint Exposed: No Bone Exposed: No Periwound Skin Texture Texture Color No Abnormalities Noted: No No Abnormalities Noted: No Callus: No Atrophie Blanche:  No Crepitus: No Cyanosis: No Excoriation: No Ecchymosis: No Induration: No Erythema: No Rash: No Hemosiderin Staining: No Scarring: No Mottled: No Pallor: No Moisture Rubor: No No Abnormalities Noted: No Dry / Scaly: No Temperature / Pain Maceration: No Temperature: No Abnormality Tenderness on Palpation: Yes Electronic Signature(s) Signed: 02/16/2022 4:30:19 PM By: Rhae Hammock RN Signed: 02/18/2022 5:16:03 PM By: Deon Pilling RN, BSN Entered By: Deon Pilling on 02/16/2022 08:33:07 -------------------------------------------------------------------------------- Vitals Details Patient Name: Date of Service: [name] [name], [name] [name]. 02/16/2022 8:00 [name] [name] Medical Record Number: [id number] Patient Account Number: [id number] Date of Birth/Sex: Treating RN: 09-30-1960 (61 y.o. [name] [name], [name] [name] Primary Care Binyomin Brann: [name] [name] Other Clinician: Referring Mohd Clemons: Treating Elora Wolter/Extender: Darrold Junker in Treatment: 0 Vital Signs Time Taken: 08:16 Temperature (F): 98.6 Height (in): 62 Pulse (bpm): 81 Source: Stated Respiratory Rate (breaths/min): 17 Weight (lbs): 110 Blood Pressure (mmHg): 148/89 Source: Stated Reference Range: 80 - 120 mg / dl Body Mass Index (BMI): 20.1 Electronic Signature(s) Signed: 02/16/2022 4:30:19 PM By: Rhae Hammock RN Entered By: Rhae Hammock on 02/16/2022 08:17:31

## 2022-02-23 ENCOUNTER — Encounter (HOSPITAL_BASED_OUTPATIENT_CLINIC_OR_DEPARTMENT_OTHER): Payer: 59 | Admitting: Internal Medicine

## 2022-02-23 DIAGNOSIS — T24231A Burn of second degree of right lower leg, initial encounter: Secondary | ICD-10-CM

## 2022-02-23 DIAGNOSIS — L03115 Cellulitis of right lower limb: Secondary | ICD-10-CM

## 2022-02-23 DIAGNOSIS — W208XXA Other cause of strike by thrown, projected or falling object, initial encounter: Secondary | ICD-10-CM | POA: Diagnosis not present

## 2022-02-23 DIAGNOSIS — I10 Essential (primary) hypertension: Secondary | ICD-10-CM | POA: Diagnosis not present

## 2022-02-23 DIAGNOSIS — E039 Hypothyroidism, unspecified: Secondary | ICD-10-CM

## 2022-02-23 DIAGNOSIS — K219 Gastro-esophageal reflux disease without esophagitis: Secondary | ICD-10-CM | POA: Diagnosis not present

## 2022-02-23 NOTE — Progress Notes (Addendum)
CURTINA, GRILLS ([id number]) [id number] Page 1 of 7 Visit Report for 02/23/2022 Chief Complaint Document Details Patient Name: Date of Service: [name] [name] 02/23/2022 1:00 PM Medical Record Number: [id number] Patient Account Number: [id number] Date of Birth/Sex: Treating RN: [birth year] (61 y.o. F) Primary Care Provider: Leanna [name] Other Clinician: Referring Provider: Treating Provider/Extender: [name] [name] in Treatment: 1 Information Obtained from: Patient Chief Complaint 02/16/2022; burn to the right foot Electronic Signature(s) Signed: 02/23/2022 4:32:14 PM By: [name] Shan DO Entered By: [name] [name] on 02/23/2022 13:52:49 -------------------------------------------------------------------------------- HPI Details Patient Name: Date of Service: [name] [name], [name] 02/23/2022 1:00 PM Medical Record Number: [id number] Patient Account Number: [id number] Date of Birth/Sex: Treating RN: 04-06-1961 (61 y.o. F) Primary Care Provider: Leanna [name] Other Clinician: Referring Provider: Treating Provider/Extender: [name] [name] in Treatment: 1 History of Present Illness HPI Description: Admission 02/16/2022 [name] is [name] 61 year old female with [name] past medical history of hypertension and hypothyroidism that presents to the clinic for 2-week history of burn to her right foot. She states she was cooking mashed potatoes and they fell on her foot resulting in [name] burn. She states she visited her primary care physician and was started on Silvadene. She had [name] follow-up visit and was prescribed doxycycline and mupirocin. She denies signs of infection currently. She reports minimal improvement in wound healing. 10/10; patient presents for follow-up. She states she has been using [name] [name] to the wound bed daily. She states she has used the entire tube over the past week. She  has increased warmth and erythema to the right foot. She states she completed doxycycline last week. Electronic Signature(s) Signed: 02/23/2022 4:32:14 PM By: [name] Shan DO Entered By: [name] [name] on 02/23/2022 13:53:46 -------------------------------------------------------------------------------- Physical Exam Details Patient Name: Date of Service: [name] [name], [name] 02/23/2022 1:00 PM Medical Record Number: [id number] Patient Account Number: [id number] Date of Birth/Sex: Treating RN: 08-29-1960 (61 y.o. [name] [name], [name] [name] ([id number]) [id number] Page 2 of 7 Primary Care Provider: Leanna [name] Other Clinician: Referring Provider: Treating Provider/Extender: [name] [name] in Treatment: 1 Constitutional respirations regular, non-labored and within target range for patient.. Cardiovascular 2+ dorsalis pedis/posterior tibialis pulses. Psychiatric pleasant and cooperative. Notes Right foot: T the dorsal aspect and along the second toe there are open wounds with dried nonviable tissue throughout. There is increased warmth and o erythema to the surrounding soft tissue. No purulent drainage. Electronic Signature(s) Signed: 02/23/2022 4:32:14 PM By: [name] Shan DO Entered By: [name] [name] on 02/23/2022 13:54:43 -------------------------------------------------------------------------------- Physician Orders Details Patient Name: Date of Service: [name] [name], [name] 02/23/2022 1:00 PM Medical Record Number: [id number] Patient Account Number: [id number] Date of Birth/Sex: Treating RN: 07-Aug-1960 (61 y.o. [name] [name] Primary Care Provider: Leanna [name] Other Clinician: Referring Provider: Treating Provider/Extender: [name] [name] in Treatment: 1 Verbal / Phone Orders: No Diagnosis Coding Follow-up Appointments ppointment in 1 week. - W/ [name] [name] and  [name] [name] Rm # 9 Return [name] Other: - We are giving you [name] coupon for the [name] [name] if it's too expensive. Anesthetic (In clinic) Topical Lidocaine 5% applied to wound bed Bathing/ Shower/ Hygiene May shower with protection but do not get wound dressing(s) wet. Edema Control - Lymphedema / SCD / Other Elevate legs to the level of the heart or above for 30 minutes daily and/or when sitting, [name] frequency of: Avoid standing for long periods of time.  Wound Treatment Wound #1 - Foot Wound Laterality: Dorsal, Right Cleanser: Soap and Water 1 x Per KGU/54 Days Discharge Instructions: May shower and wash wound with dial antibacterial soap and water prior to dressing change. Cleanser: Wound Cleanser (Generic) 1 x Per Day/15 Days Discharge Instructions: Cleanse the wound with wound cleanser prior to applying [name] clean dressing using gauze sponges, not tissue or cotton balls. Prim Dressing: Iodosorb Gel 10 (gm) Tube (DME) (Generic) 1 x Per Day/15 Days ary Discharge Instructions: Apply to wound bed as instructed Secondary Dressing: T Non-Adherent Dressing, 3x4 in (Generic) 1 x Per Day/15 Days elfa Discharge Instructions: Apply over primary dressing as directed. Secondary Dressing: Woven Gauze Sponge, Non-Sterile 4x4 in (Generic) 1 x Per Day/15 Days Discharge Instructions: Apply over primary dressing as directed. Secured With: The [name] [name], 4.5x3.1 (in/yd) (Generic) 1 x Per Day/15 Days Discharge Instructions: Secure with [name] [name] as directed. [name] [name] ([id number]) [id number] Page 3 of 7 Secured With: 65M Medipore H Soft Cloth Surgical T ape, 4 x 10 (in/yd) (Generic) 1 x Per Day/15 Days Discharge Instructions: Secure with tape as directed. Secured With: [employer] Size 5, 10 (yds) (Generic) 1 x Per Day/15 Days Wound #2 - T Second oe Wound Laterality: Right Cleanser: Soap and Water 1 x Per Day/15 Days Discharge Instructions: May shower and wash wound with dial  antibacterial soap and water prior to dressing change. Cleanser: Wound Cleanser (Generic) 1 x Per Day/15 Days Discharge Instructions: Cleanse the wound with wound cleanser prior to applying [name] clean dressing using gauze sponges, not tissue or cotton balls. Prim Dressing: MediHoney Gel, tube 1.5 (oz) 1 x Per Day/15 Days ary Discharge Instructions: Apply to wound bed as instructed Secondary Dressing: T Non-Adherent Dressing, 3x4 in (Generic) 1 x Per Day/15 Days elfa Discharge Instructions: Apply over primary dressing as directed. Secondary Dressing: Woven Gauze Sponge, Non-Sterile 4x4 in (Generic) 1 x Per Day/15 Days Discharge Instructions: Apply over primary dressing as directed. Secured With: The [name] [name], 4.5x3.1 (in/yd) (Generic) 1 x Per Day/15 Days Discharge Instructions: Secure with [name] [name] as directed. Secured With: 65M Medipore H Soft Cloth Surgical T ape, 4 x 10 (in/yd) (Generic) 1 x Per Day/15 Days Discharge Instructions: Secure with tape as directed. Secured With: [employer] Size 5, 10 (yds) (Generic) 1 x Per Day/15 Days Patient Medications llergies: codeine [name] Notifications Medication Indication Start End prior to debridement 02/23/2022 lidocaine DOSE topical 5 % ointment - ointment topical 02/23/2022 clindamycin HCl DOSE 1 - oral 300 mg capsule - 1 capsule oral q6h x 10 days Electronic Signature(s) Signed: 02/23/2022 4:32:14 PM By: [name] Shan DO Previous Signature: 02/23/2022 1:44:03 PM Version By: [name] Shan DO Entered By: [name] [name] on 02/23/2022 13:54:53 -------------------------------------------------------------------------------- Problem List Details Patient Name: Date of Service: [name] [name], [name] 02/23/2022 1:00 PM Medical Record Number: [id number] Patient Account Number: [id number] Date of Birth/Sex: Treating RN: [birth year] (61 y.o. F) Primary Care Provider: Leanna [name] Other Clinician: Referring Provider: Treating  Provider/Extender: [name] [name] in Treatment: 1 Active Problems ICD-10 Encounter Code Description Active Date MDM Diagnosis T24.231A Burn of second degree of right lower leg, initial encounter 02/16/2022 No Yes E03.9 Hypothyroidism, unspecified 02/16/2022 No Yes WINSTON, SOBCZYK ([id number]) [id number] Page 4 of 7 L03.115 Cellulitis of right lower limb 02/23/2022 No Yes Inactive Problems Resolved Problems Electronic Signature(s) Signed: 02/23/2022 4:32:14 PM By: [name] Shan DO Entered By: [name] [name] on 02/23/2022 13:52:37 -------------------------------------------------------------------------------- Progress Note Details Patient Name: Date of Service: [name] [name], [name] 02/23/2022 1:00 PM Medical Record Number: [id number] Patient Account Number: [id number] Date of Birth/Sex: Treating RN: [birth year] (61 y.o. F) Primary Care Provider: Leanna [name] Other Clinician: Referring Provider: Treating Provider/Extender: [name] [name] in Treatment: 1 Subjective Chief Complaint Information obtained from Patient 02/16/2022; burn to the right foot History of Present Illness (HPI) Admission 02/16/2022 [name] is [name] 61 year old female with [name] past medical history of hypertension and hypothyroidism that presents to the clinic for 2-week history of burn to her right foot. She states she was cooking mashed potatoes and they fell on her foot resulting in [name] burn. She states she visited her primary care physician and was started on Silvadene. She had [name] follow-up visit and was prescribed doxycycline and mupirocin. She denies signs of infection currently. She reports minimal improvement in wound healing. 10/10; patient presents for follow-up. She states she has been using [name] [name] to the wound bed daily. She states she has used the entire tube over the past week. She has increased warmth and  erythema to the right foot. She states she completed doxycycline last week. Patient History Information obtained from Patient, Chart. Family History Unknown History. Social History Current every day smoker, Marital Status - Married, Alcohol Use - Moderate, Drug Use - No History, Caffeine Use - Moderate. Medical History Cardiovascular Patient has history of Hypertension Medical [name] Surgical History Notes nd Gastrointestinal GERD Endocrine thyroid disease Objective Constitutional respirations regular, non-labored and within target range for patient.Marland Kitchen [name] [name], [name] [name] ([id number]) [id number] Page 5 of 7 Vitals Time Taken: 1:05 PM, Height: 62 in, Weight: 110 lbs, BMI: 20.1, Temperature: 98.8 F, Pulse: 81 bpm, Respiratory Rate: 18 breaths/min, Blood Pressure: 164/96 mmHg. Cardiovascular 2+ dorsalis pedis/posterior tibialis pulses. Psychiatric pleasant and cooperative. General Notes: Right foot: T the dorsal aspect and along the second toe there are open wounds with dried nonviable tissue throughout. There is increased o warmth and erythema to the surrounding soft tissue. No purulent drainage. Integumentary (Hair, Skin) Wound #1 status is Open. Original cause of wound was Thermal Burn. The date acquired was: 02/02/2022. The wound has been in treatment 1 weeks. The wound is located on the Right,Dorsal Foot. The wound measures 5.2cm length x 1.2cm width x 0.1cm depth; 4.901cm^2 area and 0.49cm^3 volume. There is Fat Layer (Subcutaneous Tissue) exposed. There is no tunneling or undermining noted. There is [name] medium amount of serosanguineous drainage noted. The wound margin is distinct with the outline attached to the wound base. There is small (1-33%) red, pink granulation within the wound bed. There is [name] large (67-100%) amount of necrotic tissue within the wound bed including Adherent Slough. The periwound skin appearance had no abnormalities noted for texture.  The periwound skin appearance had no abnormalities noted for moisture. The periwound skin appearance had no abnormalities noted for color. Periwound temperature was noted as No Abnormality. The periwound has tenderness on palpation. Wound #2 status is Open. Original cause of wound was Thermal Burn. The date acquired was: 02/02/2022. The wound has been in treatment 1 weeks. The wound is located on the Right T Second. The wound measures 1.4cm length x 1.2cm width x 0.1cm depth; 1.319cm^2 area and 0.132cm^3 volume. There is Fat oe Layer (Subcutaneous Tissue) exposed. There is no tunneling or undermining noted. There is [name] medium amount of serosanguineous drainage noted. The wound margin is distinct with the outline attached to the wound base. There is small (1-33%) red granulation within the wound bed. There  is [name] large (67-100%) amount of necrotic tissue within the wound bed including Adherent Slough. The periwound skin appearance had no abnormalities noted for texture. The periwound skin appearance had no abnormalities noted for moisture. The periwound skin appearance had no abnormalities noted for color. Periwound temperature was noted as No Abnormality. The periwound has tenderness on palpation. Assessment Active Problems ICD-10 Burn of second degree of right lower leg, initial encounter Hypothyroidism, unspecified Cellulitis of right lower limb Patient presents with cellulitis to the right foot. She states she has been using [name] [name] but based on exam it really does not look like she has used this daily for the last week. There is tightly dried nonviable tissue throughout. She states she has no more [name] [name] left. She reports just finishing taking doxycycline. She reports [name] penicillin allergy of hives. At this time I will prescribe her clindamycin. I also recommended switching the dressing to Iodosorb to the large wound and Medihoney to the 2 smaller wounds. Plan Follow-up Appointments: Return  Appointment in 1 week. - W/ Dr. Heber Levant and [name] [name] Rm # 9 Other: - We are giving you [name] coupon for the [name] [name] if it's too expensive. Anesthetic: (In clinic) Topical Lidocaine 5% applied to wound bed Bathing/ Shower/ Hygiene: May shower with protection but do not get wound dressing(s) wet. Edema Control - Lymphedema / SCD / Other: Elevate legs to the level of the heart or above for 30 minutes daily and/or when sitting, [name] frequency of: Avoid standing for long periods of time. The following medication(s) was prescribed: lidocaine topical 5 % ointment ointment topical for prior to debridement was prescribed at facility clindamycin HCl oral 300 mg capsule 1 1 capsule oral q6h x 10 days starting 02/23/2022 WOUND #1: - Foot Wound Laterality: Dorsal, Right Cleanser: Soap and Water 1 x Per Day/15 Days Discharge Instructions: May shower and wash wound with dial antibacterial soap and water prior to dressing change. Cleanser: Wound Cleanser (Generic) 1 x Per Day/15 Days Discharge Instructions: Cleanse the wound with wound cleanser prior to applying [name] clean dressing using gauze sponges, not tissue or cotton balls. Prim Dressing: Iodosorb Gel 10 (gm) Tube (DME) (Generic) 1 x Per Day/15 Days ary Discharge Instructions: Apply to wound bed as instructed Secondary Dressing: T Non-Adherent Dressing, 3x4 in (Generic) 1 x Per Day/15 Days elfa Discharge Instructions: Apply over primary dressing as directed. Secondary Dressing: Woven Gauze Sponge, Non-Sterile 4x4 in (Generic) 1 x Per Day/15 Days Discharge Instructions: Apply over primary dressing as directed. Secured With: The [name] [name], 4.5x3.1 (in/yd) (Generic) 1 x Per Day/15 Days Discharge Instructions: Secure with [name] [name] as directed. Secured With: 12M Medipore H Soft Cloth Surgical T ape, 4 x 10 (in/yd) (Generic) 1 x Per Day/15 Days Discharge Instructions: Secure with tape as directed. Secured With: [employer] Size 5, 10 (yds) (Generic) 1 x Per Day/15  Days WOUND #2: - T Second Wound Laterality: Right oe Cleanser: Soap and Water 1 x Per Day/15 Days Discharge Instructions: May shower and wash wound with dial antibacterial soap and water prior to dressing change. Cleanser: Wound Cleanser (Generic) 1 x Per Day/15 Days Discharge Instructions: Cleanse the wound with wound cleanser prior to applying [name] clean dressing using gauze sponges, not tissue or cotton balls. [name] [name], [name] [name] ([id number]) [id number] Page 6 of 7 Prim Dressing: MediHoney Gel, tube 1.5 (oz) 1 x Per Day/15 Days ary Discharge Instructions: Apply to wound bed as instructed Secondary Dressing: T Non-Adherent Dressing, 3x4 in (Generic) 1 x Per Day/15 Days elfa  Discharge Instructions: Apply over primary dressing as directed. Secondary Dressing: Woven Gauze Sponge, Non-Sterile 4x4 in (Generic) 1 x Per Day/15 Days Discharge Instructions: Apply over primary dressing as directed. Secured With: The [name] [name], 4.5x3.1 (in/yd) (Generic) 1 x Per Day/15 Days Discharge Instructions: Secure with [name] [name] as directed. Secured With: 23M Medipore H Soft Cloth Surgical T ape, 4 x 10 (in/yd) (Generic) 1 x Per Day/15 Days Discharge Instructions: Secure with tape as directed. Secured With: St[employer] Net Size 5, 10 (yds) (Generic) 1 x Per Day/15 Days 1. Iodosorb 2. Clindamycin 3. Follow-up in 1 week Electronic Signature(s) Signed: 02/23/2022 4:32:14 PM By: [name] Shan DO Entered By: [name] [name] on 02/23/2022 14:01:38 -------------------------------------------------------------------------------- HxROS Details Patient Name: Date of Service: [name] [name], [name] 02/23/2022 1:00 PM Medical Record Number: [id number] Patient Account Number: [id number] Date of Birth/Sex: Treating RN: [birth year] (61 y.o. F) Primary Care Provider: Leanna [name] Other Clinician: Referring Provider: Treating Provider/Extender: [name] [name]  in Treatment: 1 Information Obtained From Patient Chart Cardiovascular Medical History: Positive for: Hypertension Gastrointestinal Medical History: Past Medical History Notes: GERD Endocrine Medical History: Past Medical History Notes: thyroid disease Immunizations Pneumococcal Vaccine: Received Pneumococcal Vaccination: No Implantable Devices None Family and Social History Unknown History: Yes; Current every day smoker; Marital Status - Married; Alcohol Use: Moderate; Drug Use: No History; Caffeine Use: Moderate; Financial Concerns: No; Food, Clothing or Shelter Needs: No; Support System Lacking: No; Transportation Concerns: No Electronic Signature(s) Signed: 02/23/2022 4:32:14 PM By: [name] Shan DO Entered By: [name] [name] on 02/23/2022 13:53:51 Burgess Amor ([id number]) [id number] Page 7 of 7 -------------------------------------------------------------------------------- SuperBill Details Patient Name: Date of Service: [name] [name] 02/23/2022 Medical Record Number: [id number] Patient Account Number: [id number] Date of Birth/Sex: Treating RN: 1960-06-21 (61 y.o. F) Primary Care Provider: Leanna [name] Other Clinician: Referring Provider: Treating Provider/Extender: [name] [name] in Treatment: 1 Diagnosis Coding ICD-10 Codes Code Description [phone] Burn of second degree of right lower leg, initial encounter E03.9 Hypothyroidism, unspecified L03.115 Cellulitis of right lower limb Physician Procedures : CPT4 Code Description Modifier [id number] - WC PHYS LEVEL 4 - EST PT ICD-10 Diagnosis Description L03.115 Cellulitis of right lower limb T24.231A Burn of second degree of right lower leg, initial encounter E03.9 Hypothyroidism, unspecified Quantity: 1 Electronic Signature(s) Signed: 02/23/2022 4:32:14 PM By: [name] Shan DO Entered By: [name] [name] on 02/23/2022 14:01:50

## 2022-02-23 NOTE — Progress Notes (Signed)
[name] [name] ([id number]) [id number] Page 1 of 10 Visit Report for 02/23/2022 Arrival Information Details Patient Name: Date of Service: [name] [name] 02/23/2022 1:00 PM Medical Record Number: [id number] Patient Account Number: [id number] Date of Birth/Sex: Treating RN: May 05, 1961 (61 y.o. [name] [name] Primary Care Morgyn Marut: Leanna Battles Other Clinician: Referring Ethelreda Sukhu: Treating Brocha Gilliam/Extender: Darrold Junker in Treatment: 1 Visit Information History Since Last Visit Added or deleted any medications: No Patient Arrived: Ambulatory Any new allergies or adverse reactions: No Arrival Time: 13:00 Had [name] fall or experienced change in No Accompanied By: self activities of daily living that may affect Transfer Assistance: None risk of falls: Patient Identification Verified: Yes Signs or symptoms of abuse/neglect since last visito No Secondary Verification Process Completed: Yes Hospitalized since last visit: No Patient Requires Transmission-Based Precautions: No Implantable device outside of the clinic excluding No Patient Has Alerts: No cellular tissue based products placed in the center since last visit: Has Dressing in Place as Prescribed: Yes Has Compression in Place as Prescribed: Yes Pain Present Now: Yes Electronic Signature(s) Signed: 02/23/2022 3:12:14 PM By: Sharyn Creamer RN, BSN Entered By: Sharyn Creamer on 02/23/2022 13:05:23 -------------------------------------------------------------------------------- Clinic Level of Care Assessment Details Patient Name: Date of Service: [name] [name] [name] LLISO N E. 02/23/2022 1:00 PM Medical Record Number: [id number] Patient Account Number: [id number] Date of Birth/Sex: Treating RN: Apr 09, 1961 (61 y.o. [name] [name] Primary Care Cuma Polyakov: Leanna Battles Other Clinician: Referring Yuvin Bussiere: Treating Myelle Poteat/Extender: Darrold Junker in Treatment: 1 Clinic Level of Care Assessment Items TOOL 4 Quantity Score X- 1 0 Use when only an EandM is performed on FOLLOW-UP visit ASSESSMENTS - Nursing Assessment / Reassessment X- 1 10 Reassessment of Co-morbidities (includes updates in patient status) X- 1 5 Reassessment of Adherence to Treatment Plan ASSESSMENTS - Wound and Skin [name] ssessment / Reassessment '[]'$  - 0 Simple Wound Assessment / Reassessment - one wound X- 2 5 Complex Wound Assessment / Reassessment - multiple wounds '[]'$  - 0 Dermatologic / Skin Assessment (not related to wound area) ASSESSMENTS - Focused Assessment X- 1 5 Circumferential Edema Measurements - multi extremities '[]'$  - 0 Nutritional Assessment / Counseling / Intervention [name] [name] ([id number]) [id number] Page 2 of 10 '[]'$  - 0 Lower Extremity Assessment (monofilament, tuning fork, pulses) '[]'$  - 0 Peripheral Arterial Disease Assessment (using hand held doppler) ASSESSMENTS - Ostomy and/or Continence Assessment and Care '[]'$  - 0 Incontinence Assessment and Management '[]'$  - 0 Ostomy Care Assessment and Management (repouching, etc.) PROCESS - Coordination of Care '[]'$  - 0 Simple Patient / Family Education for ongoing care X- 1 20 Complex (extensive) Patient / Family Education for ongoing care X- 1 10 Staff obtains Programmer, systems, Records, T Results / Process Orders est '[]'$  - 0 Staff telephones HHA, Nursing Homes / Clarify orders / etc '[]'$  - 0 Routine Transfer to another Facility (non-emergent condition) '[]'$  - 0 Routine Hospital Admission (non-emergent condition) '[]'$  - 0 New Admissions / Biomedical engineer / Ordering NPWT Apligraf, etc. , '[]'$  - 0 Emergency Hospital Admission (emergent condition) '[]'$  - 0 Simple Discharge Coordination '[]'$  - 0 Complex (extensive) Discharge Coordination PROCESS - Special Needs '[]'$  - 0 Pediatric / Minor Patient Management '[]'$  - 0 Isolation Patient Management '[]'$  -  0 Hearing / Language / Visual special needs '[]'$  - 0 Assessment of Community assistance (transportation, D/C planning, etc.) '[]'$  - 0 Additional assistance / Altered mentation '[]'$  - 0 Support Surface(s) Assessment (bed, cushion, seat, etc.)  INTERVENTIONS - Wound Cleansing / Measurement '[]'$  - 0 Simple Wound Cleansing - one wound X- 2 5 Complex Wound Cleansing - multiple wounds '[]'$  - 0 Wound Imaging (photographs - any number of wounds) X- 1 5 Wound Tracing (instead of photographs) '[]'$  - 0 Simple Wound Measurement - one wound X- 2 5 Complex Wound Measurement - multiple wounds INTERVENTIONS - Wound Dressings X - Small Wound Dressing one or multiple wounds 2 10 '[]'$  - 0 Medium Wound Dressing one or multiple wounds '[]'$  - 0 Large Wound Dressing one or multiple wounds X- 1 5 Application of Medications - topical '[]'$  - 0 Application of Medications - injection INTERVENTIONS - Miscellaneous '[]'$  - 0 External ear exam '[]'$  - 0 Specimen Collection (cultures, biopsies, blood, body fluids, etc.) '[]'$  - 0 Specimen(s) / Culture(s) sent or taken to Lab for analysis '[]'$  - 0 Patient Transfer (multiple staff / Civil Service fast streamer / Similar devices) '[]'$  - 0 Simple Staple / Suture removal (25 or less) '[]'$  - 0 Complex Staple / Suture removal (26 or more) '[]'$  - 0 Hypo / Hyperglycemic Management (close monitor of Blood Glucose) [name] [name] ([id number]) [id number] Page 3 of 10 '[]'$  - 0 Ankle / Brachial Index (ABI) - do not check if billed separately X- 1 5 Vital Signs Has the patient been seen at the hospital within the last three years: Yes Total Score: 115 Level Of Care: New/Established - Level 3 Electronic Signature(s) Signed: 02/23/2022 3:12:14 PM By: Sharyn Creamer RN, BSN Entered By: Sharyn Creamer on 02/23/2022 13:37:44 -------------------------------------------------------------------------------- Encounter Discharge Information Details Patient Name: Date of Service: [name] [name]  [name] [name] LLISO N E. 02/23/2022 1:00 PM Medical Record Number: [id number] Patient Account Number: [id number] Date of Birth/Sex: Treating RN: 04/18/1961 (61 y.o. [name] [name] Primary Care Jazmynn Pho: Leanna Battles Other Clinician: Referring Anila Bojarski: Treating Jonavan Vanhorn/Extender: Darrold Junker in Treatment: 1 Encounter Discharge Information Items Discharge Condition: Stable Ambulatory Status: Ambulatory Discharge Destination: Home Transportation: Private Auto Accompanied By: self Schedule Follow-up Appointment: Yes Clinical Summary of Care: Patient Declined Electronic Signature(s) Signed: 02/23/2022 3:12:14 PM By: Sharyn Creamer RN, BSN Entered By: Sharyn Creamer on 02/23/2022 13:53:35 -------------------------------------------------------------------------------- Lower Extremity Assessment Details Patient Name: Date of Service: [name] [name] [name] LLISO N E. 02/23/2022 1:00 PM Medical Record Number: [id number] Patient Account Number: [id number] Date of Birth/Sex: Treating RN: 05/15/1961 (61 y.o. [name] [name] Primary Care Uchechukwu Dhawan: Leanna Battles Other Clinician: Referring Donivan Thammavong: Treating Antionne Enrique/Extender: Darrold Junker in Treatment: 1 Edema Assessment Assessed: [Left: No] [Right: No] Edema: [Left: N] [Right: o] Calf Left: Right: Point of Measurement: From Medial Instep 29 cm Ankle Left: Right: Point of Measurement: From Medial Instep 19 cm Vascular Assessment Left: [[id number] Page 4 of 10Right:] Pulses: Dorsalis Pedis Palpable: [[id number] Page 4 of 10Yes] Electronic Signature(s) Signed: 02/23/2022 3:12:14 PM By: Sharyn Creamer RN, BSN Entered By: Sharyn Creamer on 02/23/2022 13:10:51 -------------------------------------------------------------------------------- Multi Wound Chart Details Patient Name: Date of Service: [name] [name] [name] LLISO N E. 02/23/2022  1:00 PM Medical Record Number: [id number] Patient Account Number: [id number] Date of Birth/Sex: Treating RN: Dec 10, 1960 (61 y.o. F) Primary Care Blondell Laperle: Leanna Battles Other Clinician: Referring Lowella Kindley: Treating Samyak Sackmann/Extender: Darrold Junker in Treatment: 1 Vital Signs Height(in): 62 Pulse(bpm): 81 Weight(lbs): 110 Blood Pressure(mmHg): 164/96 Body Mass Index(BMI): 20.1 Temperature(F): 98.8 Respiratory Rate(breaths/min): 18 [1:Photos:] [N/[name]:N/[name]] Right, Dorsal Foot Right T Second oe N/[name] Wound Location: Thermal Burn Thermal Burn N/[name] Wounding Event: 2nd degree Burn 2nd degree Burn N/[name] Primary  Etiology: Hypertension Hypertension N/[name] Comorbid History: 02/02/2022 02/02/2022 N/[name] Date Acquired: 1 1 N/[name] Weeks of Treatment: Open Open N/[name] Wound Status: No No N/[name] Wound Recurrence: Yes No N/[name] Clustered Wound: 2 N/[name] N/[name] Clustered Quantity: 5.2x1.2x0.1 1.4x1.2x0.1 N/[name] Measurements L x W x D (cm) 4.901 1.319 N/[name] [name] (cm) : rea 0.49 0.132 N/[name] Volume (cm) : 89.60% 55.80% N/[name] % Reduction in [name] rea: 94.80% 77.90% N/[name] % Reduction in Volume: Full Thickness With Exposed Support Full Thickness With Exposed Support N/[name] Classification: Structures Structures Medium Medium N/[name] Exudate Amount: Serosanguineous Serosanguineous N/[name] Exudate Type: red, brown red, brown N/[name] Exudate Color: Distinct, outline attached Distinct, outline attached N/[name] Wound Margin: Small (1-33%) Small (1-33%) N/[name] Granulation Amount: Red, Pink Red N/[name] Granulation Quality: Large (67-100%) Large (67-100%) N/[name] Necrotic Amount: Fat Layer (Subcutaneous Tissue): Yes Fat Layer (Subcutaneous Tissue): Yes N/[name] Exposed Structures: Fascia: No Fascia: No Tendon: No Tendon: No Muscle: No Muscle: No Joint: No Joint: No Bone: No Bone: No None Medium (34-66%) N/[name] Epithelialization: Excoriation: No Excoriation: No N/[name] Periwound Skin Texture: Induration: No Induration:  No Callus: No Callus: No [name] [name] [name] [name] ([id number]) [id number] Page 5 of 10 Crepitus: No Crepitus: No Rash: No Rash: No Scarring: No Scarring: No Maceration: No Maceration: No N/[name] Periwound Skin Moisture: Dry/Scaly: No Dry/Scaly: No Atrophie Blanche: No Atrophie Blanche: No N/[name] Periwound Skin Color: Cyanosis: No Cyanosis: No Ecchymosis: No Ecchymosis: No Erythema: No Erythema: No Hemosiderin Staining: No Hemosiderin Staining: No Mottled: No Mottled: No Pallor: No Pallor: No Rubor: No Rubor: No No Abnormality No Abnormality N/[name] Temperature: Yes Yes N/[name] Tenderness on Palpation: Treatment Notes Electronic Signature(s) Signed: 02/23/2022 4:32:14 PM By: Kalman Shan DO Entered By: Kalman Shan on 02/23/2022 13:52:43 -------------------------------------------------------------------------------- Multi-Disciplinary Care Plan Details Patient Name: Date of Service: [name] [name] [name] LLISO N E. 02/23/2022 1:00 PM Medical Record Number: [id number] Patient Account Number: [id number] Date of Birth/Sex: Treating RN: 06-25-1960 (61 y.o. [name] [name] Primary Care Jaydyn Bozzo: Leanna Battles Other Clinician: Referring Izeah Vossler: Treating Prabhnoor Ellenberger/Extender: Darrold Junker in Treatment: 1 Active Inactive Orientation to the Wound Care Program Nursing Diagnoses: Knowledge deficit related to the wound healing center program Goals: Patient/caregiver will verbalize understanding of the [facility] Program Date Initiated: 02/16/2022 Target Resolution Date: 03/20/2022 Goal Status: Active Interventions: Provide education on orientation to the wound center Notes: Wound/Skin Impairment Nursing Diagnoses: Impaired tissue integrity Knowledge deficit related to smoking impact on wound healing Knowledge deficit related to ulceration/compromised skin integrity Goals: Patient will demonstrate [name] reduced rate of  smoking or cessation of smoking Date Initiated: 02/16/2022 Target Resolution Date: 03/20/2022 Goal Status: Active Patient will have [name] decrease in wound volume by X% from date: (specify in notes) Date Initiated: 02/16/2022 Target Resolution Date: 03/20/2022 Goal Status: Active Patient/caregiver will verbalize understanding of skin care regimen Date Initiated: 02/16/2022 Target Resolution Date: 03/20/2022 Goal Status: Active Interventions: [name] [name] [name] [name] ([id number]) [id number] Page 6 of 10 Assess patient/caregiver ability to obtain necessary supplies Assess patient/caregiver ability to perform ulcer/skin care regimen upon admission and as needed Assess ulceration(s) every visit Provide education on smoking Notes: Electronic Signature(s) Signed: 02/23/2022 3:12:14 PM By: Sharyn Creamer RN, BSN Entered By: Sharyn Creamer on 02/23/2022 13:31:05 -------------------------------------------------------------------------------- Pain Assessment Details Patient Name: Date of Service: [name] [name] [name] LLISO N E. 02/23/2022 1:00 PM Medical Record Number: [id number] Patient Account Number: [id number] Date of Birth/Sex: Treating RN: 1961/03/23 (61 y.o. [name] [name] Primary Care Chianti Goh: Leanna Battles Other Clinician: Referring Gwenith Tschida: Treating Claudio Mondry/Extender: Kalman Shan  Leanna Battles Weeks in Treatment: 1 Active Problems Location of Pain Severity and Description of Pain Patient Has Paino Yes Site Locations Rate the pain. Current Pain Level: 6 Character of Pain Describe the Pain: Other: pulling, contracting Pain Management and Medication Current Pain Management: Electronic Signature(s) Signed: 02/23/2022 3:12:14 PM By: Sharyn Creamer RN, BSN Entered By: Sharyn Creamer on 02/23/2022 13:06:29 -------------------------------------------------------------------------------- Patient/Caregiver Education Details Patient Name: Date of Service: [name] [name] [name] Verne Carrow 10/10/2023andnbsp1:00 PM Medical Record Number: [id number] Patient Account Number: [id number] CHANDELL, ATTRIDGE ([id number]) [id number] Page 7 of 10 Date of Birth/Gender: Treating RN: 1960/11/06 (61 y.o. [name] [name] Primary Care Physician: Leanna Battles Other Clinician: Referring Physician: Treating Physician/Extender: Darrold Junker in Treatment: 1 Education Assessment Education Provided To: Patient Education Topics Provided Welcome T [facility]: o Methods: Explain/Verbal Responses: State content correctly Electronic Signature(s) Signed: 02/23/2022 3:12:14 PM By: Sharyn Creamer RN, BSN Entered By: Sharyn Creamer on 02/23/2022 13:31:33 -------------------------------------------------------------------------------- Wound Assessment Details Patient Name: Date of Service: [name] [name] [name] LLISO N E. 02/23/2022 1:00 PM Medical Record Number: [id number] Patient Account Number: [id number] Date of Birth/Sex: Treating RN: Nov 15, 1960 (61 y.o. [name] [name] Primary Care Wafa Martes: Leanna Battles Other Clinician: Referring Allexa Acoff: Treating Kojo Liby/Extender: Darrold Junker in Treatment: 1 Wound Status Wound Number: 1 Primary Etiology: 2nd degree Burn Wound Location: Right, Dorsal Foot Wound Status: Open Wounding Event: Thermal Burn Comorbid History: Hypertension Date Acquired: 02/02/2022 Weeks Of Treatment: 1 Clustered Wound: Yes Photos Wound Measurements Length: (cm) Width: (cm) Depth: (cm) Clustered Quantity: Area: (cm) Volume: (cm) 5.2 % Reduction in Area: 89.6% 1.2 % Reduction in Volume: 94.8% 0.1 Epithelialization: None 2 Tunneling: No 4.901 Undermining: No 0.49 Wound Description Classification: Full Thickness With Exposed Support Structures Wound Margin: Distinct, outline attached Exudate Amount: Medium Exudate Type:  Serosanguineous [name] [name] [name] [name] ([id number]) Exudate Color: red, brown Foul Odor After Cleansing: No Slough/Fibrino Yes [id number] Page 8 of 10 Wound Bed Granulation Amount: Small (1-33%) Exposed Structure Granulation Quality: Red, Pink Fascia Exposed: No Necrotic Amount: Large (67-100%) Fat Layer (Subcutaneous Tissue) Exposed: Yes Necrotic Quality: Adherent Slough Tendon Exposed: No Muscle Exposed: No Joint Exposed: No Bone Exposed: No Periwound Skin Texture Texture Color No Abnormalities Noted: Yes No Abnormalities Noted: Yes Moisture Temperature / Pain No Abnormalities Noted: Yes Temperature: No Abnormality Tenderness on Palpation: Yes Treatment Notes Wound #1 (Foot) Wound Laterality: Dorsal, Right Cleanser Soap and Water Discharge Instruction: May shower and wash wound with dial antibacterial soap and water prior to dressing change. Wound Cleanser Discharge Instruction: Cleanse the wound with wound cleanser prior to applying [name] clean dressing using gauze sponges, not tissue or cotton balls. Peri-Wound Care Topical Primary Dressing Iodosorb Gel 10 (gm) Tube Discharge Instruction: Apply to wound bed as instructed Secondary Dressing T Non-Adherent Dressing, 3x4 in elfa Discharge Instruction: Apply over primary dressing as directed. Woven Gauze Sponge, Non-Sterile 4x4 in Discharge Instruction: Apply over primary dressing as directed. Secured With [employer], 4.5x3.1 (in/yd) Discharge Instruction: Secure with Kerlix as directed. 5M Medipore H Soft Cloth Surgical T ape, 4 x 10 (in/yd) Discharge Instruction: Secure with tape as directed. Stretch Net Size 5, 10 (yds) Compression Wrap Compression Stockings Add-Ons Electronic Signature(s) Signed: 02/23/2022 3:12:14 PM By: Sharyn Creamer RN, BSN Entered By: Sharyn Creamer on 02/23/2022 13:17:24 -------------------------------------------------------------------------------- Wound  Assessment Details Patient Name: Date of Service: [name] [name] [name] LLISO N E. 02/23/2022 1:00 PM Medical Record Number: [id number] Patient Account Number: [id number]  Date of Birth/Sex: Treating RN: September 09, 1960 (61 y.o. [name] [name] Primary Care Khamille Beynon: Leanna Battles Other Clinician: Referring Mavin Dyke: Treating Varvara Legault/Extender: Jasmarie, Coppock ([id number]) [id number] Page 9 of 10 Weeks in Treatment: 1 Wound Status Wound Number: 2 Primary Etiology: 2nd degree Burn Wound Location: Right T Second oe Wound Status: Open Wounding Event: Thermal Burn Comorbid History: Hypertension Date Acquired: 02/02/2022 Weeks Of Treatment: 1 Clustered Wound: No Photos Wound Measurements Length: (cm) 1.4 Width: (cm) 1.2 Depth: (cm) 0.1 Area: (cm) 1.319 Volume: (cm) 0.132 % Reduction in Area: 55.8% % Reduction in Volume: 77.9% Epithelialization: Medium (34-66%) Tunneling: No Undermining: No Wound Description Classification: Full Thickness With Exposed Support Structures Wound Margin: Distinct, outline attached Exudate Amount: Medium Exudate Type: Serosanguineous Exudate Color: red, brown Foul Odor After Cleansing: No Slough/Fibrino Yes Wound Bed Granulation Amount: Small (1-33%) Exposed Structure Granulation Quality: Red Fascia Exposed: No Necrotic Amount: Large (67-100%) Fat Layer (Subcutaneous Tissue) Exposed: Yes Necrotic Quality: Adherent Slough Tendon Exposed: No Muscle Exposed: No Joint Exposed: No Bone Exposed: No Periwound Skin Texture Texture Color No Abnormalities Noted: Yes No Abnormalities Noted: Yes Moisture Temperature / Pain No Abnormalities Noted: Yes Temperature: No Abnormality Tenderness on Palpation: Yes Treatment Notes Wound #2 (Toe Second) Wound Laterality: Right Cleanser Soap and Water Discharge Instruction: May shower and wash wound with dial antibacterial soap and water prior to  dressing change. Wound Cleanser Discharge Instruction: Cleanse the wound with wound cleanser prior to applying [name] clean dressing using gauze sponges, not tissue or cotton balls. Peri-Wound Care Topical Primary Dressing MediHoney Gel, tube 1.5 (oz) Discharge Instruction: Apply to wound bed as instructed Secondary Dressing HARUKO, MERSCH ([id number]) [id number] Page 10 of 10 T Non-Adherent Dressing, 3x4 in elfa Discharge Instruction: Apply over primary dressing as directed. Woven Gauze Sponge, Non-Sterile 4x4 in Discharge Instruction: Apply over primary dressing as directed. Secured With [employer], 4.5x3.1 (in/yd) Discharge Instruction: Secure with Kerlix as directed. 24M Medipore H Soft Cloth Surgical T ape, 4 x 10 (in/yd) Discharge Instruction: Secure with tape as directed. Stretch Net Size 5, 10 (yds) Compression Wrap Compression Stockings Add-Ons Electronic Signature(s) Signed: 02/23/2022 3:12:14 PM By: Sharyn Creamer RN, BSN Entered By: Sharyn Creamer on 02/23/2022 13:19:54 -------------------------------------------------------------------------------- Vitals Details Patient Name: Date of Service: [name] [name] [name] LLISO N E. 02/23/2022 1:00 PM Medical Record Number: [id number] Patient Account Number: [id number] Date of Birth/Sex: Treating RN: 01/30/1961 (61 y.o. [name] [name] Primary Care Herson Prichard: Leanna Battles Other Clinician: Referring Luman Holway: Treating Isebella Upshur/Extender: Darrold Junker in Treatment: 1 Vital Signs Time Taken: 13:05 Temperature (F): 98.8 Height (in): 62 Pulse (bpm): 81 Weight (lbs): 110 Respiratory Rate (breaths/min): 18 Body Mass Index (BMI): 20.1 Blood Pressure (mmHg): 164/96 Reference Range: 80 - 120 mg / dl Electronic Signature(s) Signed: 02/23/2022 3:12:14 PM By: Sharyn Creamer RN, BSN Entered By: Sharyn Creamer on 02/23/2022 13:05:52

## 2022-03-02 ENCOUNTER — Encounter (HOSPITAL_BASED_OUTPATIENT_CLINIC_OR_DEPARTMENT_OTHER): Payer: 59 | Admitting: Internal Medicine

## 2022-03-02 DIAGNOSIS — I10 Essential (primary) hypertension: Secondary | ICD-10-CM | POA: Diagnosis not present

## 2022-03-02 DIAGNOSIS — T24231A Burn of second degree of right lower leg, initial encounter: Secondary | ICD-10-CM | POA: Diagnosis not present

## 2022-03-02 DIAGNOSIS — K219 Gastro-esophageal reflux disease without esophagitis: Secondary | ICD-10-CM | POA: Diagnosis not present

## 2022-03-02 DIAGNOSIS — E039 Hypothyroidism, unspecified: Secondary | ICD-10-CM | POA: Diagnosis not present

## 2022-03-02 DIAGNOSIS — W208XXA Other cause of strike by thrown, projected or falling object, initial encounter: Secondary | ICD-10-CM | POA: Diagnosis not present

## 2022-03-02 DIAGNOSIS — L03115 Cellulitis of right lower limb: Secondary | ICD-10-CM | POA: Diagnosis not present

## 2022-03-03 ENCOUNTER — Ambulatory Visit: Payer: Self-pay

## 2022-03-03 NOTE — Patient Outreach (Signed)
  Care Coordination   03/03/2022 Name: NYJAI GRAFF MRN: [id number] DOB: January 02, 1961   Care Coordination Outreach Attempts:  An unsuccessful telephone outreach was attempted for a scheduled appointment today.  Follow Up Plan:  Additional outreach attempts will be made to offer the patient care coordination information and services.   Encounter Outcome:  No Answer  Care Coordination Interventions Activated:  No   Care Coordination Interventions:  No, not indicated    Barb Merino, RN, BSN, CCM Care Management Coordinator [facility] Care Management  Direct Phone: [phone]

## 2022-03-04 DIAGNOSIS — I10 Essential (primary) hypertension: Secondary | ICD-10-CM | POA: Diagnosis not present

## 2022-03-04 DIAGNOSIS — E039 Hypothyroidism, unspecified: Secondary | ICD-10-CM | POA: Diagnosis not present

## 2022-03-04 DIAGNOSIS — R7989 Other specified abnormal findings of blood chemistry: Secondary | ICD-10-CM | POA: Diagnosis not present

## 2022-03-04 DIAGNOSIS — E785 Hyperlipidemia, unspecified: Secondary | ICD-10-CM | POA: Diagnosis not present

## 2022-03-04 NOTE — Progress Notes (Signed)
SHYONNA, CARLIN ([id number]) [id number] Page 1 of 8 Visit Report for 03/02/2022 Chief Complaint Document Details Patient Name: Date of Service: [name] [name] CK, [name] 03/02/2022 1:00 PM Medical Record Number: [id number] Patient Account Number: [id number] Date of Birth/Sex: Treating RN: 04-17-1961 (61 y.o. [name] [name], [name] [name] Primary Care Provider: Leanna Battles Other Clinician: Referring Provider: Treating Provider/Extender: Darrold Junker in Treatment: 2 Information Obtained from: Patient Chief Complaint 02/16/2022; burn to the right foot Electronic Signature(s) Signed: 03/02/2022 4:33:46 PM By: Kalman Shan DO Entered By: Kalman Shan on 03/02/2022 13:40:46 -------------------------------------------------------------------------------- HPI Details Patient Name: Date of Service: [name] [name] CK, [name] 03/02/2022 1:00 PM Medical Record Number: [id number] Patient Account Number: [id number] Date of Birth/Sex: Treating RN: [birth year] (61 y.o. [name] [name], [name] [name] Primary Care Provider: Leanna Battles Other Clinician: Referring Provider: Treating Provider/Extender: Darrold Junker in Treatment: 2 History of Present Illness HPI Description: Admission 02/16/2022 [name] is a 61 year old female with a past medical history of hypertension and hypothyroidism that presents to the clinic for 2-week history of burn to her right foot. She states she was cooking mashed potatoes and they fell on her foot resulting in a burn. She states she visited her primary care physician and was started on Silvadene. She had a follow-up visit and was prescribed doxycycline and mupirocin. She denies signs of infection currently. She reports minimal improvement in wound healing. 10/10; patient presents for follow-up. She states she has been using Santyl to the wound bed daily. She states she has used the  entire tube over the past week. She has increased warmth and erythema to the right foot. She states she completed doxycycline last week. 10/17; patient presents for follow-up. She just received Iodosorb in the mail and started this yesterday. She states that she squeezed out the last little bit of Santyl from the tube and has been using this to the wound bed. She is currently taking the clindamycin as prescribed at last clinic visit. She reports improvement in her symptoms. She has been using Medihoney to the toe wound. She has no issues or complaints today. Electronic Signature(s) Signed: 03/02/2022 4:33:46 PM By: Kalman Shan DO Entered By: Kalman Shan on 03/02/2022 13:44:42 Dressings and/or debridement of burns; small Details -------------------------------------------------------------------------------- Burgess Amor ([id number]) [id number] Page 2 of 8 Patient Name: Date of Service: [name] [name] CK, [name] 03/02/2022 1:00 PM Medical Record Number: [id number] Patient Account Number: [id number] Date of Birth/Sex: Treating RN: 1960/05/30 (61 y.o. [name] [name], [name] [name] Primary Care Provider: Leanna Battles Other Clinician: Referring Provider: Treating Provider/Extender: Darrold Junker in Treatment: 2 Procedure Performed for: Wound #1 Right,Dorsal Foot Performed By: Physician Kalman Shan, DO Post Procedure Diagnosis Same as Pre-procedure Electronic Signature(s) Signed: 03/02/2022 4:33:46 PM By: Kalman Shan DO Signed: 03/04/2022 4:05:37 PM By: Rhae Hammock RN Entered By: Rhae Hammock on 03/02/2022 14:21:59 -------------------------------------------------------------------------------- Dressings and/or debridement of burns; small Details Patient Name: Date of Service: [name] [name] CK, [name] 03/02/2022 1:00 PM Medical Record Number: [id number] Patient Account Number: [id number] Date of Birth/Sex:  Treating RN: [birth year] (61 y.o. [name] [name], [name] [name] Primary Care Provider: Leanna Battles Other Clinician: Referring Provider: Treating Provider/Extender: Darrold Junker in Treatment: 2 Procedure Performed for: Wound #2 Right T Second oe Performed By: Physician Kalman Shan, DO Post Procedure Diagnosis Same as Pre-procedure Electronic Signature(s) Signed: 03/02/2022 4:33:46 PM By: Kalman Shan DO Signed: 03/04/2022 4:05:37 PM By: Rhae Hammock RN  Entered By: Rhae Hammock on 03/02/2022 14:22:08 -------------------------------------------------------------------------------- Physical Exam Details Patient Name: Date of Service: [name], [name] 03/02/2022 1:00 PM Medical Record Number: [id number] Patient Account Number: [id number] Date of Birth/Sex: Treating RN: Aug 20, 1960 (61 y.o. [name] [name], [name] [name] Primary Care Provider: Leanna Battles Other Clinician: Referring Provider: Treating Provider/Extender: Darrold Junker in Treatment: 2 Constitutional respirations regular, non-labored and within target range for patient.. Cardiovascular 2+ dorsalis pedis/posterior tibialis pulses. Psychiatric pleasant and cooperative. ROSELY, FERNANDEZ ([id number]) [id number] Page 3 of 8 Notes Right foot: T the dorsal aspect there is an open wound with Tightly adhered nonviable tissue with a small edge of granulation tissue. T the second toe and just o o below this there are 2 open wounds with nonviable tissue throughout. Post-debridement there is healthy granulation tissue present. Electronic Signature(s) Signed: 03/02/2022 4:33:46 PM By: Kalman Shan DO Entered By: Kalman Shan on 03/02/2022 13:47:19 -------------------------------------------------------------------------------- Physician Orders Details Patient Name: Date of Service: [name] [name] CK, [name] 03/02/2022 1:00  PM Medical Record Number: [id number] Patient Account Number: [id number] Date of Birth/Sex: Treating RN: 1960-11-08 (61 y.o. [name] [name], [name] [name] Primary Care Provider: Leanna Battles Other Clinician: Referring Provider: Treating Provider/Extender: Darrold Junker in Treatment: 2 Verbal / Phone Orders: No Diagnosis Coding Follow-up Appointments ppointment in 1 week. - W/ Dr. Heber [place] and Allayne Butcher Rm # 9 Return A Anesthetic (In clinic) Topical Lidocaine 5% applied to wound bed Bathing/ Shower/ Hygiene May shower with protection but do not get wound dressing(s) wet. Edema Control - Lymphedema / SCD / Other Elevate legs to the level of the heart or above for 30 minutes daily and/or when sitting, a frequency of: Avoid standing for long periods of time. Wound Treatment Wound #1 - Foot Wound Laterality: Dorsal, Right Cleanser: Soap and Water 1 x Per CNO/70 Days Discharge Instructions: May shower and wash wound with dial antibacterial soap and water prior to dressing change. Cleanser: Wound Cleanser (Generic) 1 x Per Day/15 Days Discharge Instructions: Cleanse the wound with wound cleanser prior to applying a clean dressing using gauze sponges, not tissue or cotton balls. Prim Dressing: Iodosorb Gel 10 (gm) Tube (Generic) 1 x Per Day/15 Days ary Discharge Instructions: Apply to wound bed as instructed Secondary Dressing: T Non-Adherent Dressing, 3x4 in (Generic) 1 x Per Day/15 Days elfa Discharge Instructions: Apply over primary dressing as directed. Secondary Dressing: Woven Gauze Sponge, Non-Sterile 4x4 in (Generic) 1 x Per Day/15 Days Discharge Instructions: Apply over primary dressing as directed. Secured With: [employer], 4.5x3.1 (in/yd) (Generic) 1 x Per Day/15 Days Discharge Instructions: Secure with Kerlix as directed. Secured With: 5M Medipore H Soft Cloth Surgical T ape, 4 x 10 (in/yd) (Generic) 1 x Per Day/15 Days Discharge Instructions: Secure  with tape as directed. Secured With: [employer] Size 5, 10 (yds) (Generic) 1 x Per Day/15 Days Wound #2 - T Second oe Wound Laterality: Right Cleanser: Soap and Water 1 x Per Day/15 Days Discharge Instructions: May shower and wash wound with dial antibacterial soap and water prior to dressing change. Cleanser: Wound Cleanser (Generic) 1 x Per Day/15 Days Discharge Instructions: Cleanse the wound with wound cleanser prior to applying a clean dressing using gauze sponges, not tissue or cotton balls. Topical: Bacitracin Ointment, 1 (oz) tube 1 x Per Day/15 Days JADIE, ALLINGTON ([id number]) [id number] Page 4 of 8 Secondary Dressing: T Non-Adherent Dressing, 3x4 in (Generic) 1 x Per Day/15 Days elfa Discharge Instructions: Apply over  primary dressing as directed. Secondary Dressing: Woven Gauze Sponge, Non-Sterile 4x4 in (Generic) 1 x Per Day/15 Days Discharge Instructions: Apply over primary dressing as directed. Secured With: [employer], 4.5x3.1 (in/yd) (Generic) 1 x Per Day/15 Days Discharge Instructions: Secure with Kerlix as directed. Secured With: 70M Medipore H Soft Cloth Surgical T ape, 4 x 10 (in/yd) (Generic) 1 x Per Day/15 Days Discharge Instructions: Secure with tape as directed. Secured With: [employer] Size 5, 10 (yds) (Generic) 1 x Per Day/15 Days Electronic Signature(s) Signed: 03/02/2022 4:33:46 PM By: Kalman Shan DO Entered By: Kalman Shan on 03/02/2022 13:47:27 -------------------------------------------------------------------------------- Problem List Details Patient Name: Date of Service: [name] [name] CK, [name] 03/02/2022 1:00 PM Medical Record Number: [id number] Patient Account Number: [id number] Date of Birth/Sex: Treating RN: 1960-06-29 (61 y.o. [name] [name], [name] [name] Primary Care Provider: Leanna Battles Other Clinician: Referring Provider: Treating Provider/Extender: Darrold Junker in  Treatment: 2 Active Problems ICD-10 Encounter Code Description Active Date MDM Diagnosis T24.231A Burn of second degree of right lower leg, initial encounter 02/16/2022 No Yes E03.9 Hypothyroidism, unspecified 02/16/2022 No Yes L03.115 Cellulitis of right lower limb 02/23/2022 No Yes Inactive Problems Resolved Problems Electronic Signature(s) Signed: 03/02/2022 4:33:46 PM By: Kalman Shan DO Entered By: Kalman Shan on 03/02/2022 13:40:34 -------------------------------------------------------------------------------- Progress Note Details Patient Name: Date of Service: [name] [name] CK, A Hollie Salk E. 03/02/2022 1:00 PM Burgess Amor ([id number]) [id number] Page 5 of 8 Medical Record Number: [id number] Patient Account Number: [id number] Date of Birth/Sex: Treating RN: 06/10/1960 (61 y.o. [name] [name], [name] [name] Primary Care Provider: Leanna Battles Other Clinician: Referring Provider: Treating Provider/Extender: Darrold Junker in Treatment: 2 Subjective Chief Complaint Information obtained from Patient 02/16/2022; burn to the right foot History of Present Illness (HPI) Admission 02/16/2022 [name] is a 61 year old female with a past medical history of hypertension and hypothyroidism that presents to the clinic for 2-week history of burn to her right foot. She states she was cooking mashed potatoes and they fell on her foot resulting in a burn. She states she visited her primary care physician and was started on Silvadene. She had a follow-up visit and was prescribed doxycycline and mupirocin. She denies signs of infection currently. She reports minimal improvement in wound healing. 10/10; patient presents for follow-up. She states she has been using Santyl to the wound bed daily. She states she has used the entire tube over the past week. She has increased warmth and erythema to the right foot. She states she  completed doxycycline last week. 10/17; patient presents for follow-up. She just received Iodosorb in the mail and started this yesterday. She states that she squeezed out the last little bit of Santyl from the tube and has been using this to the wound bed. She is currently taking the clindamycin as prescribed at last clinic visit. She reports improvement in her symptoms. She has been using Medihoney to the toe wound. She has no issues or complaints today. Patient History Information obtained from Patient, Chart. Family History Unknown History. Social History Current every day smoker, Marital Status - Married, Alcohol Use - Moderate, Drug Use - No History, Caffeine Use - Moderate. Medical History Cardiovascular Patient has history of Hypertension Medical A Surgical History Notes nd Gastrointestinal GERD Endocrine thyroid disease Objective Constitutional respirations regular, non-labored and within target range for patient.. Vitals Time Taken: 1:03 PM, Height: 62 in, Weight: 110 lbs, BMI: 20.1, Temperature: 99 F, Pulse: 82 bpm, Respiratory Rate: 18 breaths/min,  Blood Pressure: 153/104 mmHg. Cardiovascular 2+ dorsalis pedis/posterior tibialis pulses. Psychiatric pleasant and cooperative. General Notes: Right foot: T the dorsal aspect there is an open wound with Tightly adhered nonviable tissue with a small edge of granulation tissue. T the o o second toe and just below this there are 2 open wounds with nonviable tissue throughout. Post-debridement there is healthy granulation tissue present. Integumentary (Hair, Skin) Wound #1 status is Open. Original cause of wound was Thermal Burn. The date acquired was: 02/02/2022. The wound has been in treatment 2 weeks. The wound is located on the Right,Dorsal Foot. The wound measures 4.8cm length x 1.7cm width x 0.1cm depth; 6.409cm^2 area and 0.641cm^3 volume. There is Fat Layer (Subcutaneous Tissue) exposed. There is no tunneling or  undermining noted. There is a medium amount of serosanguineous drainage noted. The wound margin is distinct with the outline attached to the wound base. There is small (1-33%) red, pink granulation within the wound bed. There is a large (67-100%) amount of necrotic tissue within the wound bed including Adherent Slough. The periwound skin appearance had no abnormalities noted for texture. The periwound skin appearance had no abnormalities noted for moisture. The periwound skin appearance had no abnormalities noted for color. Periwound temperature was noted as No Abnormality. The periwound has tenderness on palpation. Wound #2 status is Open. Original cause of wound was Thermal Burn. The date acquired was: 02/02/2022. The wound has been in treatment 2 weeks. The wound is located on the Right T Second. The wound measures 1.4cm length x 0.7cm width x 0.1cm depth; 0.77cm^2 area and 0.077cm^3 volume. There is Fat Layer oe (Subcutaneous Tissue) exposed. There is a medium amount of serosanguineous drainage noted. The wound margin is distinct with the outline attached to the KINZLEE, SELVY ([id number]) [id number] Page 6 of 8 wound base. There is small (1-33%) red granulation within the wound bed. There is a large (67-100%) amount of necrotic tissue within the wound bed including Adherent Slough. The periwound skin appearance had no abnormalities noted for texture. The periwound skin appearance had no abnormalities noted for moisture. The periwound skin appearance had no abnormalities noted for color. Periwound temperature was noted as No Abnormality. The periwound has tenderness on palpation. Assessment Active Problems ICD-10 Burn of second degree of right lower leg, initial encounter Hypothyroidism, unspecified Cellulitis of right lower limb Patient's cellulitis has resolved with the use of clindamycin. I recommended she finish out her course which is just 3 more days. The  wound beds have also improved in size and appearance since last clinic visit. I debrided nonviable tissue. T the toe wound I recommended bacitracin ointment as this appears o almost healed. T the large dorsal foot wound I recommended Iodosorb for which she has today. o Procedures Wound #1 Pre-procedure diagnosis of Wound #1 is a 2nd degree Burn located on the Right,Dorsal Foot . There was a Excisional Skin/Subcutaneous Tissue Debridement with a total area of 8.16 sq cm performed by Kalman Shan, DO. With the following instrument(s): Curette to remove Viable and Non-Viable tissue/material. Material removed includes Eschar, Subcutaneous Tissue, and Slough after achieving pain control using Lidocaine. No specimens were taken. A time out was conducted at 13:30, prior to the start of the procedure. A Minimum amount of bleeding was controlled with Pressure. The procedure was tolerated well with a pain level of 0 throughout and a pain level of 0 following the procedure. Post Debridement Measurements: 4.8cm length x 1.7cm width x 0.1cm depth; 0.641cm^3 volume. Character of  Wound/Ulcer Post Debridement is improved. Post procedure Diagnosis Wound #1: Same as Pre-Procedure Wound #2 Pre-procedure diagnosis of Wound #2 is a 2nd degree Burn located on the Right T Second . There was a Excisional Skin/Subcutaneous Tissue Debridement oe with a total area of 0.98 sq cm performed by Kalman Shan, DO. With the following instrument(s): Curette to remove Viable and Non-Viable tissue/material. Material removed includes Eschar, Subcutaneous Tissue, and Slough after achieving pain control using Lidocaine. No specimens were taken. A time out was conducted at 13:30, prior to the start of the procedure. A Minimum amount of bleeding was controlled with Pressure. The procedure was tolerated well with a pain level of 0 throughout and a pain level of 0 following the procedure. Post Debridement Measurements: 1.4cm  length x 0.7cm width x 0.1cm depth; 0.077cm^3 volume. Character of Wound/Ulcer Post Debridement is improved. Post procedure Diagnosis Wound #2: Same as Pre-Procedure Plan Follow-up Appointments: Return Appointment in 1 week. - W/ Dr. Heber [place] and Allayne Butcher Rm # 9 Anesthetic: (In clinic) Topical Lidocaine 5% applied to wound bed Bathing/ Shower/ Hygiene: May shower with protection but do not get wound dressing(s) wet. Edema Control - Lymphedema / SCD / Other: Elevate legs to the level of the heart or above for 30 minutes daily and/or when sitting, a frequency of: Avoid standing for long periods of time. WOUND #1: - Foot Wound Laterality: Dorsal, Right Cleanser: Soap and Water 1 x Per ZOX/09 Days Discharge Instructions: May shower and wash wound with dial antibacterial soap and water prior to dressing change. Cleanser: Wound Cleanser (Generic) 1 x Per Day/15 Days Discharge Instructions: Cleanse the wound with wound cleanser prior to applying a clean dressing using gauze sponges, not tissue or cotton balls. Prim Dressing: Iodosorb Gel 10 (gm) Tube (Generic) 1 x Per Day/15 Days ary Discharge Instructions: Apply to wound bed as instructed Secondary Dressing: T Non-Adherent Dressing, 3x4 in (Generic) 1 x Per Day/15 Days elfa Discharge Instructions: Apply over primary dressing as directed. Secondary Dressing: Woven Gauze Sponge, Non-Sterile 4x4 in (Generic) 1 x Per Day/15 Days Discharge Instructions: Apply over primary dressing as directed. Secured With: [employer], 4.5x3.1 (in/yd) (Generic) 1 x Per Day/15 Days Discharge Instructions: Secure with Kerlix as directed. Secured With: 21M [employer]ipore H Soft Cloth Surgical T ape, 4 x 10 (in/yd) (Generic) 1 x Per Day/15 Days Discharge Instructions: Secure with tape as directed. Secured With: [employer] Size 5, 10 (yds) (Generic) 1 x Per Day/15 Days WOUND #2: - T Second Wound Laterality: Right oe Cleanser: Soap and Water 1 x Per Day/15  Days Discharge Instructions: May shower and wash wound with dial antibacterial soap and water prior to dressing change. Cleanser: Wound Cleanser (Generic) 1 x Per Day/15 Days Discharge Instructions: Cleanse the wound with wound cleanser prior to applying a clean dressing using gauze sponges, not tissue or cotton balls. Topical: Bacitracin Ointment, 1 (oz) tube 1 x Per Day/15 Days TYIESHA, BRACKNEY ([id number]) [id number] Page 7 of 8 Secondary Dressing: T Non-Adherent Dressing, 3x4 in (Generic) 1 x Per Day/15 Days elfa Discharge Instructions: Apply over primary dressing as directed. Secondary Dressing: Woven Gauze Sponge, Non-Sterile 4x4 in (Generic) 1 x Per Day/15 Days Discharge Instructions: Apply over primary dressing as directed. Secured With: [employer], 4.5x3.1 (in/yd) (Generic) 1 x Per Day/15 Days Discharge Instructions: Secure with Kerlix as directed. Secured With: 21M [employer] H Soft Cloth Surgical T ape, 4 x 10 (in/yd) (Generic) 1 x Per Day/15 Days Discharge Instructions: Secure with tape as  directed. Secured With: [employer] Size 5, 10 (yds) (Generic) 1 x Per Day/15 Days 1. In office sharp debridement 2. Bacitracin ointmentootoe wound 3. Iodosorb 4. Follow-up in 1 week 5. Complete oral antibiotic course Electronic Signature(s) Signed: 03/02/2022 4:33:46 PM By: Kalman Shan DO Entered By: Kalman Shan on 03/02/2022 13:49:19 -------------------------------------------------------------------------------- HxROS Details Patient Name: Date of Service: [name] [name] CK, [name] 03/02/2022 1:00 PM Medical Record Number: [id number] Patient Account Number: [id number] Date of Birth/Sex: Treating RN: January 23, 1961 (61 y.o. [name] [name], [name] [name] Primary Care Provider: Leanna Battles Other Clinician: Referring Provider: Treating Provider/Extender: Darrold Junker in Treatment: 2 Information Obtained From Patient  Chart Cardiovascular Medical History: Positive for: Hypertension Gastrointestinal Medical History: Past Medical History Notes: GERD Endocrine Medical History: Past Medical History Notes: thyroid disease Immunizations Pneumococcal Vaccine: Received Pneumococcal Vaccination: No Implantable Devices None Family and Social History Unknown History: Yes; Current every day smoker; Marital Status - Married; Alcohol Use: Moderate; Drug Use: No History; Caffeine Use: Moderate; Financial Concerns: No; Food, Clothing or Shelter Needs: No; Support System Lacking: No; Transportation Concerns: No Electronic Signature(s) Signed: 03/02/2022 4:33:46 PM By: Kalman Shan DO Signed: 03/04/2022 4:05:37 PM By: Rhae Hammock RN Entered By: Kalman Shan on 03/02/2022 13:44:49 Burgess Amor ([id number]) [id number] Page 8 of 8 -------------------------------------------------------------------------------- SuperBill Details Patient Name: Date of Service: Larey Dresser 03/02/2022 Medical Record Number: [id number] Patient Account Number: [id number] Date of Birth/Sex: Treating RN: 01-21-1961 (61 y.o. [name] [name], [name] [name] Primary Care Provider: Leanna Battles Other Clinician: Referring Provider: Treating Provider/Extender: Darrold Junker in Treatment: 2 Diagnosis Coding ICD-10 Codes Code Description [phone] Burn of second degree of right lower leg, initial encounter E03.9 Hypothyroidism, unspecified L03.115 Cellulitis of right lower limb Facility Procedures : CPT4 Code: [id number] Description: 16020 - BURN DRSG W/O ANESTH-SM ICD-10 Diagnosis Description T24.231A Burn of second degree of right lower leg, initial encounter Modifier: Quantity: 1 Physician Procedures : CPT4 Code Description Modifier [id number] 16020 - [place] DRESS/DEBRID SM,<5% TOT BODY SURF ICD-10 Diagnosis Description S85.462V Burn of second degree of  right lower leg, initial encounter Quantity: 1 Electronic Signature(s) Signed: 03/02/2022 4:33:46 PM By: Kalman Shan DO Signed: 03/04/2022 4:05:37 PM By: Rhae Hammock RN Entered By: Rhae Hammock on 03/02/2022 14:22:27

## 2022-03-08 DIAGNOSIS — R82998 Other abnormal findings in urine: Secondary | ICD-10-CM | POA: Diagnosis not present

## 2022-03-09 ENCOUNTER — Encounter (HOSPITAL_BASED_OUTPATIENT_CLINIC_OR_DEPARTMENT_OTHER): Payer: 59 | Admitting: Internal Medicine

## 2022-03-16 ENCOUNTER — Encounter (HOSPITAL_BASED_OUTPATIENT_CLINIC_OR_DEPARTMENT_OTHER): Payer: 59 | Admitting: Internal Medicine

## 2022-03-16 DIAGNOSIS — K219 Gastro-esophageal reflux disease without esophagitis: Secondary | ICD-10-CM | POA: Diagnosis not present

## 2022-03-16 DIAGNOSIS — E039 Hypothyroidism, unspecified: Secondary | ICD-10-CM | POA: Diagnosis not present

## 2022-03-16 DIAGNOSIS — T24231A Burn of second degree of right lower leg, initial encounter: Secondary | ICD-10-CM

## 2022-03-16 DIAGNOSIS — I10 Essential (primary) hypertension: Secondary | ICD-10-CM | POA: Diagnosis not present

## 2022-03-16 DIAGNOSIS — L03115 Cellulitis of right lower limb: Secondary | ICD-10-CM | POA: Diagnosis not present

## 2022-03-16 DIAGNOSIS — W208XXA Other cause of strike by thrown, projected or falling object, initial encounter: Secondary | ICD-10-CM | POA: Diagnosis not present

## 2022-03-19 NOTE — Progress Notes (Signed)
DELECIA, VASTINE ([id number]) [id number] Page 1 of 9 Visit Report for 03/16/2022 Arrival Information Details Patient Name: Date of Service: [name] [name] 03/16/2022 1:30 PM Medical Record Number: [id number] Patient Account Number: [id number] Date of Birth/Sex: Treating RN: 12-30-1960 (61 y.o. F) Primary Care [name] [name]: [name] [name] Other Clinician: Referring [name] [name]: Treating [name] [name]/Extender: [name] [name] in Treatment: 4 Visit Information History Since Last Visit Added or deleted any medications: No Patient Arrived: Ambulatory Any new allergies or adverse reactions: No Arrival Time: 13:57 Had [name] fall or experienced change in No Accompanied By: self activities of daily living that may affect Transfer Assistance: None risk of falls: Patient Identification Verified: Yes Signs or symptoms of abuse/neglect since last visito No Secondary Verification Process Completed: Yes Hospitalized since last visit: No Patient Requires Transmission-Based Precautions: No Implantable device outside of the clinic excluding No Patient Has Alerts: No cellular tissue based products placed in the center since last visit: Has Dressing in Place as Prescribed: Yes Pain Present Now: No Electronic Signature(s) Signed: 03/18/2022 11:40:36 AM By: [name] [name] Entered By: [name] [name] on 03/16/2022 13:58:35 -------------------------------------------------------------------------------- Encounter Discharge Information Details Patient Name: Date of Service: [name] [name], [name] [name] E. 03/16/2022 1:30 PM Medical Record Number: [id number] Patient Account Number: [id number] Date of Birth/Sex: Treating RN: 04-Oct-1960 (61 y.o. [name] [name] Primary Care [name] [name]: [name] [name] Other Clinician: Referring [name] [name]: Treating [name] [name]/Extender: [name] [name] in Treatment: 4 Encounter Discharge Information  Items Discharge Condition: Stable Ambulatory Status: Ambulatory Discharge Destination: Home Transportation: Private Auto Accompanied By: self Schedule Follow-up Appointment: Yes Clinical Summary of Care: Electronic Signature(s) Signed: 03/17/2022 10:46:15 AM By: [name] Pilling RN, BSN Entered By: [name] [name] on 03/16/2022 14:37:22 [name] [name] ([id number]) [id number] Page 2 of 9 -------------------------------------------------------------------------------- Lower Extremity Assessment Details Patient Name: Date of Service: [name] [name], [name] Ehlers E. 03/16/2022 1:30 PM Medical Record Number: [id number] Patient Account Number: [id number] Date of Birth/Sex: Treating RN: 1961-03-28 (61 y.o. F) Primary Care [name] [name]: [name] [name] Other Clinician: Referring Bently Wyss: Treating [name] [name]/Extender: [name] [name] in Treatment: 4 Edema Assessment Assessed: [Left: No] [Right: No] Edema: [Left: N] [Right: o] Calf Left: Right: Point of Measurement: From Medial Instep 28.5 cm Ankle Left: Right: Point of Measurement: From Medial Instep 18 cm Vascular Assessment Pulses: Dorsalis Pedis Palpable: [Right:Yes] Electronic Signature(s) Signed: 03/18/2022 11:40:36 AM By: [name] [name] Entered By: [name] [name] on 03/16/2022 14:18:50 -------------------------------------------------------------------------------- Multi Wound Chart Details Patient Name: Date of Service: [name] [name], [name] 03/16/2022 1:30 PM Medical Record Number: [id number] Patient Account Number: [id number] Date of Birth/Sex: Treating RN: 1960/11/15 (61 y.o. F) Primary Care [name] [name]: [name] [name] Other Clinician: Referring [name] [name]: Treating [name] [name]: [name] [name] in Treatment: 4 Vital Signs Height(in): 62 Pulse(bpm): 106 Weight(lbs): 110 Blood Pressure(mmHg): 175/105 Body Mass Index(BMI): 20.1 Temperature(F):  98.5 Respiratory Rate(breaths/min): 18 [1:Photos:] [N/[name]:N/[name] [id number] Page 3 of 9] Right, Dorsal Foot Right T Second oe N/[name] Wound Location: Thermal Burn Thermal Burn N/[name] Wounding Event: 2nd degree Burn 2nd degree Burn N/[name] Primary Etiology: Hypertension Hypertension N/[name] Comorbid History: 02/02/2022 02/02/2022 N/[name] Date Acquired: 4 4 N/[name] Weeks of Treatment: Open Open N/[name] Wound Status: No No N/[name] Wound Recurrence: Yes No N/[name] Clustered Wound: 2 N/[name] N/[name] Clustered Quantity: 4x1.5x0.1 1x0.2x0.1 N/[name] Measurements L x W x D (cm) 4.712 0.157 N/[name] [name] (cm) : rea 0.471 0.016 N/[name] Volume (cm) : 90.00% 94.70% N/[name] % Reduction in [name] rea: 95.00% 97.30% N/[name] %  Reduction in Volume: Full Thickness With Exposed Support Full Thickness With Exposed Support N/[name] Classification: Structures Structures Medium Medium N/[name] Exudate Amount: Serosanguineous Serosanguineous N/[name] Exudate Type: red, brown red, brown N/[name] Exudate Color: Distinct, outline attached Distinct, outline attached N/[name] Wound Margin: Small (1-33%) Large (67-100%) N/[name] Granulation Amount: Red, Pink Red N/[name] Granulation Quality: Large (67-100%) None Present (0%) N/[name] Necrotic Amount: Fat Layer (Subcutaneous Tissue): Yes Fascia: No N/[name] Exposed Structures: Fascia: No Fat Layer (Subcutaneous Tissue): No Tendon: No Tendon: No Muscle: No Muscle: No Joint: No Joint: No Bone: No Bone: No None Medium (34-66%) N/[name] Epithelialization: Excoriation: No Excoriation: No N/[name] Periwound Skin Texture: Induration: No Induration: No Callus: No Callus: No Crepitus: No Crepitus: No Rash: No Rash: No Scarring: No Scarring: No Maceration: No Maceration: Yes N/[name] Periwound Skin Moisture: Dry/Scaly: No Dry/Scaly: No Atrophie Blanche: No Atrophie Blanche: No N/[name] Periwound Skin Color: Cyanosis: No Cyanosis: No Ecchymosis: No Ecchymosis: No Erythema: No Erythema: No Hemosiderin Staining: No Hemosiderin  Staining: No Mottled: No Mottled: No Pallor: No Pallor: No Rubor: No Rubor: No No Abnormality No Abnormality N/[name] Temperature: Yes Yes N/[name] Tenderness on Palpation: Dressings and/or debridement of N/[name] N/[name] Procedures Performed: burns; medium Treatment Notes Wound #1 (Foot) Wound Laterality: Dorsal, Right Cleanser Soap and Water Discharge Instruction: May shower and wash wound with dial antibacterial soap and water prior to dressing change. Wound Cleanser Discharge Instruction: Cleanse the wound with wound cleanser prior to applying [name] clean dressing using gauze sponges, not tissue or cotton balls. Peri-Wound Care Topical Primary Dressing Iodosorb Gel 10 (gm) Tube Discharge Instruction: Apply to wound bed as instructed Secondary Dressing T Non-Adherent Dressing, 3x4 in elfa Discharge Instruction: Apply over primary dressing as directed. Woven Gauze Sponge, Non-Sterile 4x4 in Discharge Instruction: Apply over primary dressing as directed. Secured With [employer], 4.5x3.1 (in/yd) Discharge Instruction: Secure with Kerlix as directed. 71M Medipore H Soft Cloth Surgical T ape, 4 x 10 (in/yd) Discharge Instruction: Secure with tape as directed. NORETTA, FRIER ([id number]) [id number] Page 4 of 9 Stretch Net Size 5, 10 (yds) Compression Wrap Compression Stockings Add-Ons Wound #2 (Toe Second) Wound Laterality: Right Cleanser Soap and Water Discharge Instruction: May shower and wash wound with dial antibacterial soap and water prior to dressing change. Wound Cleanser Discharge Instruction: Cleanse the wound with wound cleanser prior to applying [name] clean dressing using gauze sponges, not tissue or cotton balls. Peri-Wound Care Topical Primary Dressing Secondary Dressing T Non-Adherent Dressing, 3x4 in elfa Discharge Instruction: Apply over primary dressing as directed. Secured With [employer], 4.5x3.1 (in/yd) Discharge  Instruction: Secure with Kerlix as directed. 71M Medipore H Soft Cloth Surgical T ape, 4 x 10 (in/yd) Discharge Instruction: Secure with tape as directed. Stretch Net Size 5, 10 (yds) Compression Wrap Compression Stockings Add-Ons Electronic Signature(s) Signed: 03/19/2022 1:40:10 PM By: Kalman Shan DO Entered By: Kalman Shan on 03/16/2022 14:40:38 -------------------------------------------------------------------------------- Multi-Disciplinary Care Plan Details Patient Name: Date of Service: [name] [name], [name] 03/16/2022 1:30 PM Medical Record Number: [id number] Patient Account Number: [id number] Date of Birth/Sex: Treating RN: 1961/04/08 (61 y.o. [name] [name] Primary Care Ragna Kramlich: [name] [name] Other Clinician: Referring Zhaniya Swallows: Treating Jacquelynne Guedes/Extender: [name] [name] in Treatment: 4 Active Inactive Wound/Skin Impairment Nursing Diagnoses: Impaired tissue integrity Knowledge deficit related to smoking impact on wound healing Knowledge deficit related to ulceration/compromised skin integrity Goals: Patient will demonstrate [name] reduced rate of smoking or cessation of smoking Date Initiated: 02/16/2022 Target Resolution Date: 05/06/2022 Goal Status:  [address] [name] [name], [name] [name] ([id number]) [id number] Page 5 of 9 Patient will have [name] decrease in wound volume by X% from date: (specify in notes) Date Initiated: 02/16/2022 Target Resolution Date: 05/13/2022 Goal Status: Active Patient/caregiver will verbalize understanding of skin care regimen Date Initiated: 02/16/2022 Target Resolution Date: 05/05/2022 Goal Status: Active Interventions: Assess patient/caregiver ability to obtain necessary supplies Assess patient/caregiver ability to perform ulcer/skin care regimen upon admission and as needed Assess ulceration(s) every visit Provide education on smoking Notes: Electronic Signature(s) Signed: 03/17/2022  10:46:15 AM By: [name] Pilling RN, BSN Entered By: [name] [name] on 03/16/2022 14:31:13 -------------------------------------------------------------------------------- Pain Assessment Details Patient Name: Date of Service: [name] [name], [name] 03/16/2022 1:30 PM Medical Record Number: [id number] Patient Account Number: [id number] Date of Birth/Sex: Treating RN: 06-08-1960 (61 y.o. F) Primary Care Jalyn Dutta: [name] [name] Other Clinician: Referring Germaine Shenker: Treating Sharyl Panchal/Extender: [name] [name] in Treatment: 4 Active Problems Location of Pain Severity and Description of Pain Patient Has Paino Yes Site Locations Pain Location: Pain in Ulcers With Dressing Change: Yes Rate the pain. Current Pain Level: 0 Character of Pain Describe the Pain: Throbbing, Other: stinging Pain Management and Medication Current Pain Management: Electronic Signature(s) Signed: 03/18/2022 11:40:36 AM By: [name] [name] Entered By: [name] [name] on 03/16/2022 13:59:09 [name] [name] ([id number]) [id number] Page 6 of 9 -------------------------------------------------------------------------------- Patient/Caregiver Education Details Patient Name: Date of Service: [name] [name] 10/31/2023andnbsp1:30 PM Medical Record Number: [id number] Patient Account Number: [id number] Date of Birth/Gender: Treating RN: [birth year] (61 y.o. [name] [name] Primary Care Physician: [name] [name] Other Clinician: Referring Physician: Treating Physician/Extender: [name] [name] in Treatment: 4 Education Assessment Education Provided To: Patient Education Topics Provided Wound/Skin Impairment: Handouts: Skin Care Do's and Dont's Methods: Explain/Verbal Responses: Reinforcements needed Electronic Signature(s) Signed: 03/17/2022 10:46:15 AM By: [name] Pilling RN, BSN Entered By: [name] [name] on 03/16/2022  14:31:31 -------------------------------------------------------------------------------- Wound Assessment Details Patient Name: Date of Service: [name] [name], [name] 03/16/2022 1:30 PM Medical Record Number: [id number] Patient Account Number: [id number] Date of Birth/Sex: Treating RN: 1961-01-20 (61 y.o. F) Primary Care Emmelyn Schmale: [name] [name] Other Clinician: Referring Senovia Gauer: Treating Duane Trias/Extender: [name] [name] in Treatment: 4 Wound Status Wound Number: 1 Primary Etiology: 2nd degree Burn Wound Location: Right, Dorsal Foot Wound Status: Open Wounding Event: Thermal Burn Comorbid History: Hypertension Date Acquired: 02/02/2022 Weeks Of Treatment: 4 Clustered Wound: Yes Photos Wound Measurements Length: (cm) [name] [name], [name] [name] ([id number]) Width: (cm) Depth: (cm) Clustered Quantity: Area: (cm) Volume: (cm) 4 % Reduction in Area: 90% [id number] Page 7 of 9 1.5 % Reduction in Volume: 95% 0.1 Epithelialization: None 2 Tunneling: No 4.712 Undermining: No 0.471 Wound Description Classification: Full Thickness With Exposed Support Structures Wound Margin: Distinct, outline attached Exudate Amount: Medium Exudate Type: Serosanguineous Exudate Color: red, brown Foul Odor After Cleansing: No Slough/Fibrino Yes Wound Bed Granulation Amount: Small (1-33%) Exposed Structure Granulation Quality: Red, Pink Fascia Exposed: No Necrotic Amount: Large (67-100%) Fat Layer (Subcutaneous Tissue) Exposed: Yes Necrotic Quality: Adherent Slough Tendon Exposed: No Muscle Exposed: No Joint Exposed: No Bone Exposed: No Periwound Skin Texture Texture Color No Abnormalities Noted: Yes No Abnormalities Noted: Yes Moisture Temperature / Pain No Abnormalities Noted: Yes Temperature: No Abnormality Tenderness on Palpation: Yes Treatment Notes Wound #1 (Foot) Wound Laterality: Dorsal, Right Cleanser Soap and  Water Discharge Instruction: May shower and wash wound with dial antibacterial soap and water prior to dressing change. Wound Cleanser Discharge Instruction: Cleanse the  wound with wound cleanser prior to applying [name] clean dressing using gauze sponges, not tissue or cotton balls. Peri-Wound Care Topical Primary Dressing Iodosorb Gel 10 (gm) Tube Discharge Instruction: Apply to wound bed as instructed Secondary Dressing T Non-Adherent Dressing, 3x4 in elfa Discharge Instruction: Apply over primary dressing as directed. Woven Gauze Sponge, Non-Sterile 4x4 in Discharge Instruction: Apply over primary dressing as directed. Secured With [employer], 4.5x3.1 (in/yd) Discharge Instruction: Secure with Kerlix as directed. 738M Medipore H Soft Cloth Surgical T ape, 4 x 10 (in/yd) Discharge Instruction: Secure with tape as directed. Stretch Net Size 5, 10 (yds) Compression Wrap Compression Stockings Add-Ons Electronic Signature(s) Signed: 03/18/2022 11:40:36 AM By: [name] [name] Entered By: [name] [name] on 03/16/2022 14:13:42 [name] [name] ([id number]) [id number] Page 8 of 9 -------------------------------------------------------------------------------- Wound Assessment Details Patient Name: Date of Service: [name] [name], [name] Ehlers E. 03/16/2022 1:30 PM Medical Record Number: [id number] Patient Account Number: [id number] Date of Birth/Sex: Treating RN: 1960/06/27 (61 y.o. F) Primary Care Christphor Groft: [name] [name] Other Clinician: Referring Calahan Pak: Treating Mckenzie Toruno/Extender: [name] [name] in Treatment: 4 Wound Status Wound Number: 2 Primary Etiology: 2nd degree Burn Wound Location: Right T Second oe Wound Status: Open Wounding Event: Thermal Burn Comorbid History: Hypertension Date Acquired: 02/02/2022 Weeks Of Treatment: 4 Clustered Wound: No Photos Wound Measurements Length: (cm) Width: (cm) Depth:  (cm) Area: (cm) Volume: (cm) 1 % Reduction in Area: 94.7% 0.2 % Reduction in Volume: 97.3% 0.1 Epithelialization: Medium (34-66%) 0.157 Tunneling: No 0.016 Undermining: No Wound Description Classification: Full Thickness With Exposed Suppo Wound Margin: Distinct, outline attached Exudate Amount: Medium Exudate Type: Serosanguineous Exudate Color: red, brown rt Structures Foul Odor After Cleansing: No Slough/Fibrino Yes Wound Bed Granulation Amount: Large (67-100%) Exposed Structure Granulation Quality: Red Fascia Exposed: No Necrotic Amount: None Present (0%) Fat Layer (Subcutaneous Tissue) Exposed: No Tendon Exposed: No Muscle Exposed: No Joint Exposed: No Bone Exposed: No Periwound Skin Texture Texture Color No Abnormalities Noted: Yes No Abnormalities Noted: Yes Moisture Temperature / Pain No Abnormalities Noted: No Temperature: No Abnormality Dry / Scaly: No Tenderness on Palpation: Yes Maceration: Yes Treatment Notes Wound #2 (Toe Second) Wound Laterality: Right Cleanser CARMELINA, BALDUCCI ([id number]) [id number] Page 9 of 9 Soap and Water Discharge Instruction: May shower and wash wound with dial antibacterial soap and water prior to dressing change. Wound Cleanser Discharge Instruction: Cleanse the wound with wound cleanser prior to applying [name] clean dressing using gauze sponges, not tissue or cotton balls. Peri-Wound Care Topical Primary Dressing Secondary Dressing T Non-Adherent Dressing, 3x4 in elfa Discharge Instruction: Apply over primary dressing as directed. Secured With [employer], 4.5x3.1 (in/yd) Discharge Instruction: Secure with Kerlix as directed. 738M Medipore H Soft Cloth Surgical T ape, 4 x 10 (in/yd) Discharge Instruction: Secure with tape as directed. Stretch Net Size 5, 10 (yds) Compression Wrap Compression Stockings Add-Ons Electronic Signature(s) Signed: 03/18/2022 11:40:36 AM By: [name] [name] Entered By: [name] [name] on 03/16/2022 14:14:31 -------------------------------------------------------------------------------- Vitals Details Patient Name: Date of Service: [name] [name], [name] 03/16/2022 1:30 PM Medical Record Number: [id number] Patient Account Number: [id number] Date of Birth/Sex: Treating RN: [birth year] (61 y.o. F) Primary Care Baptiste Littler: [name] [name] Other Clinician: Referring Seda Kronberg: Treating Ebb Carelock/Extender: [name] [name] in Treatment: 4 Vital Signs Time Taken: 14:01 Temperature (F): 98.5 Height (in): 62 Pulse (bpm): 106 Weight (lbs): 110 Respiratory Rate (breaths/min): 18 Body Mass Index (BMI): 20.1 Blood Pressure (mmHg): 175/105 Reference Range: 80 - 120 mg /  dl Electronic Signature(s) Signed: 03/18/2022 11:40:36 AM By: [name] [name] Entered By: [name] [name] on 03/16/2022 14:18:28

## 2022-03-19 NOTE — Progress Notes (Signed)
MOMOKA, STRINGFIELD ([id number]) [id number] Page 1 of 8 Visit Report for 03/16/2022 Chief Complaint Document Details Patient Name: Date of Service: [name] [name] 03/16/2022 1:30 PM Medical Record Number: [id number] Patient Account Number: [id number] Date of Birth/Sex: Treating RN: Oct 03, 1960 (61 y.o. F) Primary Care Provider: Leanna Battles Other Clinician: Referring Provider: Treating Provider/Extender: Darrold Junker in Treatment: 4 Information Obtained from: Patient Chief Complaint 02/16/2022; burn to the right foot Electronic Signature(s) Signed: 03/19/2022 1:40:10 PM By: Kalman Shan DO Entered By: Kalman Shan on 03/16/2022 14:40:45 -------------------------------------------------------------------------------- HPI Details Patient Name: Date of Service: [name] [name], [name] 03/16/2022 1:30 PM Medical Record Number: [id number] Patient Account Number: [id number] Date of Birth/Sex: Treating RN: Jan 06, 1961 (61 y.o. F) Primary Care Provider: Leanna Battles Other Clinician: Referring Provider: Treating Provider/Extender: Darrold Junker in Treatment: 4 History of Present Illness HPI Description: Admission 02/16/2022 [name] is a 61 year old female with a past medical history of hypertension and hypothyroidism that presents to the clinic for 2-week history of burn to her right foot. She states she was cooking mashed potatoes and they fell on her foot resulting in a burn. She states she visited her primary care physician and was started on Silvadene. She had a follow-up visit and was prescribed doxycycline and mupirocin. She denies signs of infection currently. She reports minimal improvement in wound healing. 10/10; patient presents for follow-up. She states she has been using Santyl to the wound bed daily. She states she has used the entire tube over the past week. She  has increased warmth and erythema to the right foot. She states she completed doxycycline last week. 10/17; patient presents for follow-up. She just received Iodosorb in the mail and started this yesterday. She states that she squeezed out the last little bit of Santyl from the tube and has been using this to the wound bed. She is currently taking the clindamycin as prescribed at last clinic visit. She reports improvement in her symptoms. She has been using Medihoney to the toe wound. She has no issues or complaints today. 10/31; patient presents for follow-up. She states she has been using Iodosorb to the wound bed and bacitracin to the toe wound. She has no issues or complaints today. Electronic Signature(s) Signed: 03/19/2022 1:40:10 PM By: Kalman Shan DO Entered By: Kalman Shan on 03/16/2022 14:41:11 Burgess Amor ([id number]) [id number] Page 2 of 8 -------------------------------------------------------------------------------- Dressings and/or debridement of burns; medium Details Patient Name: Date of Service: [name] [name], [name] Ehlers E. 03/16/2022 1:30 PM Medical Record Number: [id number] Patient Account Number: [id number] Date of Birth/Sex: Treating RN: 29-Apr-1961 (61 y.o. [name] [name] Primary Care Provider: Leanna Battles Other Clinician: Referring Provider: Treating Provider/Extender: Darrold Junker in Treatment: 4 Procedure Performed for: Wound #1 Right,Dorsal Foot Performed By: Physician Kalman Shan, DO Post Procedure Diagnosis Same as Pre-procedure Notes curette used to debride to burn. Electronic Signature(s) Signed: 03/17/2022 10:46:15 AM By: Deon Pilling RN, BSN Signed: 03/19/2022 1:40:10 PM By: Kalman Shan DO Entered By: Deon Pilling on 03/16/2022 14:36:16 -------------------------------------------------------------------------------- Physical Exam Details Patient Name: Date of  Service: [name] [name], [name] 03/16/2022 1:30 PM Medical Record Number: [id number] Patient Account Number: [id number] Date of Birth/Sex: Treating RN: Mar 12, 1961 (61 y.o. F) Primary Care Provider: Leanna Battles Other Clinician: Referring Provider: Treating Provider/Extender: Darrold Junker in Treatment: 4 Constitutional respirations regular, non-labored and within target range for patient.. Cardiovascular 2+  dorsalis pedis/posterior tibialis pulses. Psychiatric pleasant and cooperative. Notes Right foot: T the dorsal aspect there is an open wound with Nonviable tissue and granulation tissue.. T the second toe There is an area of skin breakdown. o o Just distal to this there is epithelization to her previous wound site. Electronic Signature(s) Signed: 03/19/2022 1:40:10 PM By: Kalman Shan DO Entered By: Kalman Shan on 03/16/2022 14:42:08 Physician Orders Details -------------------------------------------------------------------------------- Burgess Amor ([id number]) [id number] Page 3 of 8 Patient Name: Date of Service: [name] [name], [name] Ehlers E. 03/16/2022 1:30 PM Medical Record Number: [id number] Patient Account Number: [id number] Date of Birth/Sex: Treating RN: 1960-08-02 (61 y.o. [name], [name] Primary Care Provider: Leanna Battles Other Clinician: Referring Provider: Treating Provider/Extender: Darrold Junker in Treatment: 4 Verbal / Phone Orders: No Diagnosis Coding ICD-10 Coding Code Description B09.628Z Burn of second degree of right lower leg, initial encounter E03.9 Hypothyroidism, unspecified L03.115 Cellulitis of right lower limb Follow-up Appointments ppointment in 2 weeks. - Dr. Heber [place] Return A Anesthetic (In clinic) Topical Lidocaine 5% applied to wound bed Bathing/ Shower/ Hygiene May shower with protection but do not get wound dressing(s) wet. Edema  Control - Lymphedema / SCD / Other Elevate legs to the level of the heart or above for 30 minutes daily and/or when sitting, a frequency of: Avoid standing for long periods of time. Wound Treatment Wound #1 - Foot Wound Laterality: Dorsal, Right Cleanser: Soap and Water 1 x Per MOQ/94 Days Discharge Instructions: May shower and wash wound with dial antibacterial soap and water prior to dressing change. Cleanser: Wound Cleanser (Generic) 1 x Per Day/15 Days Discharge Instructions: Cleanse the wound with wound cleanser prior to applying a clean dressing using gauze sponges, not tissue or cotton balls. Prim Dressing: Iodosorb Gel 10 (gm) Tube (Generic) 1 x Per Day/15 Days ary Discharge Instructions: Apply to wound bed as instructed Secondary Dressing: T Non-Adherent Dressing, 3x4 in (Generic) 1 x Per Day/15 Days elfa Discharge Instructions: Apply over primary dressing as directed. Secondary Dressing: Woven Gauze Sponge, Non-Sterile 4x4 in (Generic) 1 x Per Day/15 Days Discharge Instructions: Apply over primary dressing as directed. Secured With: [employer], 4.5x3.1 (in/yd) (Generic) 1 x Per Day/15 Days Discharge Instructions: Secure with Kerlix as directed. Secured With: 50M Medipore H Soft Cloth Surgical T ape, 4 x 10 (in/yd) (Generic) 1 x Per Day/15 Days Discharge Instructions: Secure with tape as directed. Secured With: [employer] Size 5, 10 (yds) (Generic) 1 x Per Day/15 Days Wound #2 - T Second oe Wound Laterality: Right Cleanser: Soap and Water 1 x Per Day/15 Days Discharge Instructions: May shower and wash wound with dial antibacterial soap and water prior to dressing change. Cleanser: Wound Cleanser (Generic) 1 x Per Day/15 Days Discharge Instructions: Cleanse the wound with wound cleanser prior to applying a clean dressing using gauze sponges, not tissue or cotton balls. Secondary Dressing: T Non-Adherent Dressing, 3x4 in (Generic) 1 x Per Day/15 Days elfa Discharge  Instructions: Apply over primary dressing as directed. Secured With: [employer], 4.5x3.1 (in/yd) (Generic) 1 x Per Day/15 Days Discharge Instructions: Secure with Kerlix as directed. Secured With: 50M Medipore H Soft Cloth Surgical T ape, 4 x 10 (in/yd) (Generic) 1 x Per Day/15 Days Discharge Instructions: Secure with tape as directed. Secured With: Stretch Net Size 5, 10 (yds) (Generic) 1 x Per Day/15 Days Electronic Signature(s) ZARIAH, CAVENDISH ([id number]) [id number] Page 4 of 8 Signed: 03/19/2022 1:40:10 PM By: Kalman Shan DO Entered  By: Kalman Shan on 03/16/2022 14:42:14 -------------------------------------------------------------------------------- Problem List Details Patient Name: Date of Service: [name] [name], [name] 03/16/2022 1:30 PM Medical Record Number: [id number] Patient Account Number: [id number] Date of Birth/Sex: Treating RN: 22-Oct-1960 (61 y.o. [name], [name] Primary Care Provider: Leanna Battles Other Clinician: Referring Provider: Treating Provider/Extender: Darrold Junker in Treatment: 4 Active Problems ICD-10 Encounter Code Description Active Date MDM Diagnosis T24.231A Burn of second degree of right lower leg, initial encounter 02/16/2022 No Yes E03.9 Hypothyroidism, unspecified 02/16/2022 No Yes L03.115 Cellulitis of right lower limb 02/23/2022 No Yes Inactive Problems Resolved Problems Electronic Signature(s) Signed: 03/19/2022 1:40:10 PM By: Kalman Shan DO Entered By: Kalman Shan on 03/16/2022 14:40:33 -------------------------------------------------------------------------------- Progress Note Details Patient Name: Date of Service: [name] [name], [name] 03/16/2022 1:30 PM Medical Record Number: [id number] Patient Account Number: [id number] Date of Birth/Sex: Treating RN: [birth year] (61 y.o. F) Primary Care Provider: Leanna Battles Other  Clinician: Referring Provider: Treating Provider/Extender: Darrold Junker in Treatment: 4 Subjective Chief Complaint Information obtained from Patient 02/16/2022; burn to the right foot History of Present Illness (HPI) Admission 02/16/2022 [name] is a 61 year old female with a past medical history of hypertension and hypothyroidism that presents to the clinic for 2-week history of burn to her right foot. She states she was cooking mashed potatoes and they fell on her foot resulting in a burn. She states she visited her primary care physician and was started on Silvadene. She had a follow-up visit and was prescribed doxycycline and mupirocin. She denies signs of infection currently. She GERTRUDE, BUCKS ([id number]) [id number] Page 5 of 8 reports minimal improvement in wound healing. 10/10; patient presents for follow-up. She states she has been using Santyl to the wound bed daily. She states she has used the entire tube over the past week. She has increased warmth and erythema to the right foot. She states she completed doxycycline last week. 10/17; patient presents for follow-up. She just received Iodosorb in the mail and started this yesterday. She states that she squeezed out the last little bit of Santyl from the tube and has been using this to the wound bed. She is currently taking the clindamycin as prescribed at last clinic visit. She reports improvement in her symptoms. She has been using Medihoney to the toe wound. She has no issues or complaints today. 10/31; patient presents for follow-up. She states she has been using Iodosorb to the wound bed and bacitracin to the toe wound. She has no issues or complaints today. Patient History Information obtained from Patient, Chart. Family History Unknown History. Social History Current every day smoker, Marital Status - Married, Alcohol Use - Moderate, Drug Use - No  History, Caffeine Use - Moderate. Medical History Cardiovascular Patient has history of Hypertension Medical A Surgical History Notes nd Gastrointestinal GERD Endocrine thyroid disease Objective Constitutional respirations regular, non-labored and within target range for patient.. Vitals Time Taken: 2:01 PM, Height: 62 in, Weight: 110 lbs, BMI: 20.1, Temperature: 98.5 F, Pulse: 106 bpm, Respiratory Rate: 18 breaths/min, Blood Pressure: 175/105 mmHg. Cardiovascular 2+ dorsalis pedis/posterior tibialis pulses. Psychiatric pleasant and cooperative. General Notes: Right foot: T the dorsal aspect there is an open wound with Nonviable tissue and granulation tissue.. T the second toe There is an area of skin o o breakdown. Just distal to this there is epithelization to her previous wound site. Integumentary (Hair, Skin) Wound #1 status is Open. Original cause of wound was  Thermal Burn. The date acquired was: 02/02/2022. The wound has been in treatment 4 weeks. The wound is located on the Right,Dorsal Foot. The wound measures 4cm length x 1.5cm width x 0.1cm depth; 4.712cm^2 area and 0.471cm^3 volume. There is Fat Layer (Subcutaneous Tissue) exposed. There is no tunneling or undermining noted. There is a medium amount of serosanguineous drainage noted. The wound margin is distinct with the outline attached to the wound base. There is small (1-33%) red, pink granulation within the wound bed. There is a large (67-100%) amount of necrotic tissue within the wound bed including Adherent Slough. The periwound skin appearance had no abnormalities noted for texture. The periwound skin appearance had no abnormalities noted for moisture. The periwound skin appearance had no abnormalities noted for color. Periwound temperature was noted as No Abnormality. The periwound has tenderness on palpation. Wound #2 status is Open. Original cause of wound was Thermal Burn. The date acquired was: 02/02/2022. The  wound has been in treatment 4 weeks. The wound is located on the Right T Second. The wound measures 1cm length x 0.2cm width x 0.1cm depth; 0.157cm^2 area and 0.016cm^3 volume. There is no oe tunneling or undermining noted. There is a medium amount of serosanguineous drainage noted. The wound margin is distinct with the outline attached to the wound base. There is large (67-100%) red granulation within the wound bed. There is no necrotic tissue within the wound bed. The periwound skin appearance had no abnormalities noted for texture. The periwound skin appearance had no abnormalities noted for color. The periwound skin appearance exhibited: Maceration. The periwound skin appearance did not exhibit: Dry/Scaly. Periwound temperature was noted as No Abnormality. The periwound has tenderness on palpation. Assessment Active Problems ICD-10 Burn of second degree of right lower leg, initial encounter Hypothyroidism, unspecified Cellulitis of right lower limb MERICA, PRELL ([id number]) [id number] Page 6 of 8 Patient's wound has shown improvement in size and appearance since last clinic visit. No signs of infection. I debrided nonviable tissue. I recommended continuing Iodosorb to the dorsal foot and bacitracin to the toe wound. Follow-up in 2 weeks. Procedures Wound #1 Pre-procedure diagnosis of Wound #1 is a 2nd degree Burn located on the Right,Dorsal Foot . An Dressings and/or debridement of burns; medium procedure was performed by Kalman Shan, DO. Post procedure Diagnosis Wound #1: Same as Pre-Procedure Notes: curette used to debride to burn. Plan Follow-up Appointments: Return Appointment in 2 weeks. - Dr. Heber [place] Anesthetic: (In clinic) Topical Lidocaine 5% applied to wound bed Bathing/ Shower/ Hygiene: May shower with protection but do not get wound dressing(s) wet. Edema Control - Lymphedema / SCD / Other: Elevate legs to the level of the heart or  above for 30 minutes daily and/or when sitting, a frequency of: Avoid standing for long periods of time. WOUND #1: - Foot Wound Laterality: Dorsal, Right Cleanser: Soap and Water 1 x Per IWO/03 Days Discharge Instructions: May shower and wash wound with dial antibacterial soap and water prior to dressing change. Cleanser: Wound Cleanser (Generic) 1 x Per Day/15 Days Discharge Instructions: Cleanse the wound with wound cleanser prior to applying a clean dressing using gauze sponges, not tissue or cotton balls. Prim Dressing: Iodosorb Gel 10 (gm) Tube (Generic) 1 x Per Day/15 Days ary Discharge Instructions: Apply to wound bed as instructed Secondary Dressing: T Non-Adherent Dressing, 3x4 in (Generic) 1 x Per Day/15 Days elfa Discharge Instructions: Apply over primary dressing as directed. Secondary Dressing: Woven Gauze Sponge, Non-Sterile 4x4 in (Generic) 1 x  Per Day/15 Days Discharge Instructions: Apply over primary dressing as directed. Secured With: [employer], 4.5x3.1 (in/yd) (Generic) 1 x Per Day/15 Days Discharge Instructions: Secure with Kerlix as directed. Secured With: 38M Medipore H Soft Cloth Surgical T ape, 4 x 10 (in/yd) (Generic) 1 x Per Day/15 Days Discharge Instructions: Secure with tape as directed. Secured With: [employer] Size 5, 10 (yds) (Generic) 1 x Per Day/15 Days WOUND #2: - T Second Wound Laterality: Right oe Cleanser: Soap and Water 1 x Per Day/15 Days Discharge Instructions: May shower and wash wound with dial antibacterial soap and water prior to dressing change. Cleanser: Wound Cleanser (Generic) 1 x Per Day/15 Days Discharge Instructions: Cleanse the wound with wound cleanser prior to applying a clean dressing using gauze sponges, not tissue or cotton balls. Secondary Dressing: T Non-Adherent Dressing, 3x4 in (Generic) 1 x Per Day/15 Days elfa Discharge Instructions: Apply over primary dressing as directed. Secured With: [employer], 4.5x3.1  (in/yd) (Generic) 1 x Per Day/15 Days Discharge Instructions: Secure with Kerlix as directed. Secured With: 38M [employer]ipore H Soft Cloth Surgical T ape, 4 x 10 (in/yd) (Generic) 1 x Per Day/15 Days Discharge Instructions: Secure with tape as directed. Secured With: Stretch Net Size 5, 10 (yds) (Generic) 1 x Per Day/15 Days 1. In office sharp debridement 2. Iodosorb 3. Bacitracin 4. Follow-up in 2 weeks Electronic Signature(s) Signed: 03/19/2022 1:40:10 PM By: Kalman Shan DO Entered By: Kalman Shan on 03/16/2022 14:43:07 -------------------------------------------------------------------------------- HxROS Details Patient Name: Date of Service: [name] [name], [name] 03/16/2022 1:30 PM Medical Record Number: [id number] Patient Account Number: [id number] TYSHAUNA, FINKBINER ([id number]) [id number] Page 7 of 8 Date of Birth/Sex: Treating RN: 1960/11/06 (61 y.o. F) Primary Care Provider: Leanna Battles Other Clinician: Referring Provider: Treating Provider/Extender: Darrold Junker in Treatment: 4 Information Obtained From Patient Chart Cardiovascular Medical History: Positive for: Hypertension Gastrointestinal Medical History: Past Medical History Notes: GERD Endocrine Medical History: Past Medical History Notes: thyroid disease Immunizations Pneumococcal Vaccine: Received Pneumococcal Vaccination: No Implantable Devices None Family and Social History Unknown History: Yes; Current every day smoker; Marital Status - Married; Alcohol Use: Moderate; Drug Use: No History; Caffeine Use: Moderate; Financial Concerns: No; Food, Clothing or Shelter Needs: No; Support System Lacking: No; Transportation Concerns: No Electronic Signature(s) Signed: 03/19/2022 1:40:10 PM By: Kalman Shan DO Entered By: Kalman Shan on 03/16/2022  14:41:16 -------------------------------------------------------------------------------- SuperBill Details Patient Name: Date of Service: [name] [name], A Hollie Salk E. 03/16/2022 Medical Record Number: [id number] Patient Account Number: [id number] Date of Birth/Sex: Treating RN: [birth year] (61 y.o. [name] [name] Primary Care Provider: Leanna Battles Other Clinician: Referring Provider: Treating Provider/Extender: Darrold Junker in Treatment: 4 Diagnosis Coding ICD-10 Codes Code Description [phone] Burn of second degree of right lower leg, initial encounter E03.9 Hypothyroidism, unspecified L03.115 Cellulitis of right lower limb Facility Procedures Physician Procedures : CPT4 Code Description Modifier [id number] - WC PHYS TX BURN DRESS/DEBRID SMALL AREA ICD-10 Diagnosis Description T24.231A Burn of second degree of right lower leg, initial encounter Quantity: 1 Electronic Signature(s) Signed: 03/19/2022 1:40:10 PM By: Kalman Shan DO Entered By: Kalman Shan on 03/16/2022 14:43:13

## 2022-03-24 NOTE — Progress Notes (Signed)
Rescheduled 04/02/22  [place]  Direct Dial: [phone]

## 2022-03-30 ENCOUNTER — Encounter (HOSPITAL_BASED_OUTPATIENT_CLINIC_OR_DEPARTMENT_OTHER): Payer: 59 | Attending: Internal Medicine | Admitting: Internal Medicine

## 2022-03-30 DIAGNOSIS — W208XXA Other cause of strike by thrown, projected or falling object, initial encounter: Secondary | ICD-10-CM | POA: Insufficient documentation

## 2022-03-30 DIAGNOSIS — E039 Hypothyroidism, unspecified: Secondary | ICD-10-CM | POA: Diagnosis not present

## 2022-03-30 DIAGNOSIS — I10 Essential (primary) hypertension: Secondary | ICD-10-CM | POA: Insufficient documentation

## 2022-03-30 DIAGNOSIS — T25021A Burn of unspecified degree of right foot, initial encounter: Secondary | ICD-10-CM | POA: Insufficient documentation

## 2022-03-30 DIAGNOSIS — L03115 Cellulitis of right lower limb: Secondary | ICD-10-CM | POA: Diagnosis not present

## 2022-03-30 DIAGNOSIS — L97518 Non-pressure chronic ulcer of other part of right foot with other specified severity: Secondary | ICD-10-CM | POA: Insufficient documentation

## 2022-03-30 DIAGNOSIS — T24231A Burn of second degree of right lower leg, initial encounter: Secondary | ICD-10-CM

## 2022-03-31 NOTE — Progress Notes (Signed)
ANGELISSA, SUPAN ([id number]) [id number] Page 1 of 7 Visit Report for 03/30/2022 Chief Complaint Document Details Patient Name: Date of Service: [name] [name], [name]. 03/30/2022 1:00 PM Medical Record Number: [id number] Patient Account Number: [id number] Date of Birth/Sex: Treating RN: [birth year] (61 y.o. F) Primary Care Provider: Leanna Battles Other Clinician: Referring Provider: Treating Provider/Extender: Darrold Junker in Treatment: 6 Information Obtained from: Patient Chief Complaint 02/16/2022; burn to the right foot Electronic Signature(s) Signed: 03/30/2022 2:06:49 PM By: Kalman Shan DO Entered By: Kalman Shan on 03/30/2022 14:02:24 -------------------------------------------------------------------------------- HPI Details Patient Name: Date of Service: [name] [name], [name] 03/30/2022 1:00 PM Medical Record Number: [id number] Patient Account Number: [id number] Date of Birth/Sex: Treating RN: [birth year] (61 y.o. F) Primary Care Provider: Leanna Battles Other Clinician: Referring Provider: Treating Provider/Extender: Darrold Junker in Treatment: 6 History of Present Illness HPI Description: Admission 02/16/2022 Ms. [name] is a 61 year old female with a past medical history of hypertension and hypothyroidism that presents to the clinic for 2-week history of burn to her right foot. She states she was cooking mashed potatoes and they fell on her foot resulting in a burn. She states she visited her primary care physician and was started on Silvadene. She had a follow-up visit and was prescribed doxycycline and mupirocin. She denies signs of infection currently. She reports minimal improvement in wound healing. 10/10; patient presents for follow-up. She states she has been using Santyl to the wound bed daily. She states she has used the entire tube over the past week. She  has increased warmth and erythema to the right foot. She states she completed doxycycline last week. 10/17; patient presents for follow-up. She just received Iodosorb in the mail and started this yesterday. She states that she squeezed out the last little bit of Santyl from the tube and has been using this to the wound bed. She is currently taking the clindamycin as prescribed at last clinic visit. She reports improvement in her symptoms. She has been using Medihoney to the toe wound. She has no issues or complaints today. 10/31; patient presents for follow-up. She states she has been using Iodosorb to the wound bed and bacitracin to the toe wound. She has no issues or complaints today. 11/14; patient presents for follow-up. She has been using Iodosorb to the wound bed. The toe wound has healed. She has no issues or complaints today. Electronic Signature(s) Signed: 03/30/2022 2:06:49 PM By: Kalman Shan DO Entered By: Kalman Shan on 03/30/2022 14:02:42 Burgess Amor ([id number]) [id number] Page 2 of 7 -------------------------------------------------------------------------------- Dressings and/or debridement of burns; small Details Patient Name: Date of Service: [name], [name] 03/30/2022 1:00 PM Medical Record Number: [id number] Patient Account Number: [id number] Date of Birth/Sex: Treating RN: 09/07/1960 (61 y.o. [name] Primary Care Provider: Leanna Battles Other Clinician: Referring Provider: Treating Provider/Extender: Darrold Junker in Treatment: 6 Procedure Performed for: Wound #1 Right,Dorsal Foot Performed By: Physician Kalman Shan, DO Post Procedure Diagnosis Same as Pre-procedure Notes debrided with curette 5. documented by Deon Pilling, RN for Dr. Heber [place]. Electronic Signature(s) Signed: 03/30/2022 2:06:49 PM By: Kalman Shan DO Signed: 03/30/2022 5:53:09 PM By: Deon Pilling RN,  BSN Entered By: Deon Pilling on 03/30/2022 13:54:19 -------------------------------------------------------------------------------- Physical Exam Details Patient Name: Date of Service: [name], [name] 03/30/2022 1:00 PM Medical Record Number: [id number] Patient Account Number: [id number] Date of Birth/Sex: Treating RN: 10/10/1960 (61 y.o.  F) Primary Care Provider: Leanna Battles Other Clinician: Referring Provider: Treating Provider/Extender: Darrold Junker in Treatment: 6 Constitutional respirations regular, non-labored and within target range for patient.. Cardiovascular 2+ dorsalis pedis/posterior tibialis pulses. Psychiatric pleasant and cooperative. Notes Right foot: T the dorsal aspect there is an open wound with Nonviable tissue and granulation tissue. T the second toe There is Epithelization to the previous o o wound site. Electronic Signature(s) Signed: 03/30/2022 2:06:49 PM By: Kalman Shan DO Entered By: Kalman Shan on 03/30/2022 14:03:33 Physician Orders Details -------------------------------------------------------------------------------- Burgess Amor ([id number]) [id number] Page 3 of 7 Patient Name: Date of Service: [name] [name], [name] 03/30/2022 1:00 PM Medical Record Number: [id number] Patient Account Number: [id number] Date of Birth/Sex: Treating RN: [birth year] (61 y.o. [name], [name] Primary Care Provider: Leanna Battles Other Clinician: Referring Provider: Treating Provider/Extender: Darrold Junker in Treatment: 6 Verbal / Phone Orders: No Diagnosis Coding ICD-10 Coding Code Description I14.431V Burn of second degree of right lower leg, initial encounter E03.9 Hypothyroidism, unspecified L03.115 Cellulitis of right lower limb Follow-up Appointments ppointment in 1 week. - Dr. Heber [place] Return A Anesthetic (In clinic) Topical Lidocaine 5%  applied to wound bed Bathing/ Shower/ Hygiene May shower with protection but do not get wound dressing(s) wet. Edema Control - Lymphedema / SCD / Other Elevate legs to the level of the heart or above for 30 minutes daily and/or when sitting, a frequency of: Avoid standing for long periods of time. Wound Treatment Wound #1 - Foot Wound Laterality: Dorsal, Right Cleanser: Soap and Water Every Other Day/15 Days Discharge Instructions: May shower and wash wound with dial antibacterial soap and water prior to dressing change. Cleanser: Wound Cleanser (Generic) Every Other Day/15 Days Discharge Instructions: Cleanse the wound with wound cleanser prior to applying a clean dressing using gauze sponges, not tissue or cotton balls. Prim Dressing: Hydrofera Blue Ready Foam, 2.5 x2.5 in Every Other Day/15 Days ary Discharge Instructions: Apply over the [place]. Prim Dressing: MediHoney Gel, tube 1.5 (oz) Every Other Day/15 Days ary Discharge Instructions: Apply to wound bed. Secondary Dressing: T Non-Adherent Dressing, 3x4 in (Generic) Every Other Day/15 Days elfa Discharge Instructions: Apply over primary dressing as directed. Secondary Dressing: Woven Gauze Sponge, Non-Sterile 4x4 in (Generic) Every Other Day/15 Days Discharge Instructions: Apply over primary dressing as directed. Secured With: [employer], 4.5x3.1 (in/yd) (Generic) Every Other Day/15 Days Discharge Instructions: Secure with Kerlix as directed. Secured With: 17M Medipore H Soft Cloth Surgical T ape, 4 x 10 (in/yd) (Generic) Every Other Day/15 Days Discharge Instructions: Secure with tape as directed. Secured With: [employer] Size 5, 10 (yds) (Generic) Every Other Day/15 Days Electronic Signature(s) Signed: 03/30/2022 2:06:49 PM By: Kalman Shan DO Entered By: Kalman Shan on 03/30/2022 14:03:39 -------------------------------------------------------------------------------- Problem List Details Patient Name:  Date of Service: [name] [name], [name] 03/30/2022 1:00 PM Medical Record Number: [id number] Patient Account Number: [id number] HELANE, BRICENO ([id number]) [id number] Page 4 of 7 Date of Birth/Sex: Treating RN: 04/05/1961 (61 y.o. [name], [name] Primary Care Provider: Leanna Battles Other Clinician: Referring Provider: Treating Provider/Extender: Darrold Junker in Treatment: 6 Active Problems ICD-10 Encounter Code Description Active Date MDM Diagnosis T24.231A Burn of second degree of right lower leg, initial encounter 02/16/2022 No Yes E03.9 Hypothyroidism, unspecified 02/16/2022 No Yes L03.115 Cellulitis of right lower limb 02/23/2022 No Yes L97.518 Non-pressure chronic ulcer of other part of right foot with other specified 03/30/2022 No Yes severity  Inactive Problems Resolved Problems Electronic Signature(s) Signed: 03/30/2022 2:06:49 PM By: Kalman Shan DO Entered By: Kalman Shan on 03/30/2022 14:05:53 -------------------------------------------------------------------------------- Progress Note Details Patient Name: Date of Service: [name] [name], [name] 03/30/2022 1:00 PM Medical Record Number: [id number] Patient Account Number: [id number] Date of Birth/Sex: Treating RN: [birth year] (61 y.o. F) Primary Care Provider: Leanna Battles Other Clinician: Referring Provider: Treating Provider/Extender: Darrold Junker in Treatment: 6 Subjective Chief Complaint Information obtained from Patient 02/16/2022; burn to the right foot History of Present Illness (HPI) Admission 02/16/2022 Ms. Kamaree Berkel is a 61 year old female with a past medical history of hypertension and hypothyroidism that presents to the clinic for 2-week history of burn to her right foot. She states she was cooking mashed potatoes and they fell on her foot resulting in a burn. She states she visited her primary  care physician and was started on Silvadene. She had a follow-up visit and was prescribed doxycycline and mupirocin. She denies signs of infection currently. She reports minimal improvement in wound healing. 10/10; patient presents for follow-up. She states she has been using Santyl to the wound bed daily. She states she has used the entire tube over the past week. She has increased warmth and erythema to the right foot. She states she completed doxycycline last week. 10/17; patient presents for follow-up. She just received Iodosorb in the mail and started this yesterday. She states that she squeezed out the last little bit of Santyl from the tube and has been using this to the wound bed. She is currently taking the clindamycin as prescribed at last clinic visit. She reports improvement in her symptoms. She has been using Medihoney to the toe wound. She has no issues or complaints today. 10/31; patient presents for follow-up. She states she has been using Iodosorb to the wound bed and bacitracin to the toe wound. She has no issues or complaints today. 11/14; patient presents for follow-up. She has been using Iodosorb to the wound bed. The toe wound has healed. She has no issues or complaints today. LEXIA, VANDEVENDER ([id number]) [id number] Page 5 of 7 Patient History Information obtained from Patient, Chart. Family History Unknown History. Social History Current every day smoker, Marital Status - Married, Alcohol Use - Moderate, Drug Use - No History, Caffeine Use - Moderate. Medical History Cardiovascular Patient has history of Hypertension Medical A Surgical History Notes nd Gastrointestinal GERD Endocrine thyroid disease Objective Constitutional respirations regular, non-labored and within target range for patient.. Vitals Time Taken: 1:13 PM, Height: 62 in, Weight: 110 lbs, BMI: 20.1, Temperature: 98.6 F, Pulse: 79 bpm, Respiratory Rate: 18  breaths/min, Blood Pressure: 172/102 mmHg. Cardiovascular 2+ dorsalis pedis/posterior tibialis pulses. Psychiatric pleasant and cooperative. General Notes: Right foot: T the dorsal aspect there is an open wound with Nonviable tissue and granulation tissue. T the second toe There is Epithelization to o o the previous wound site. Integumentary (Hair, Skin) Wound #1 status is Open. Original cause of wound was Thermal Burn. The date acquired was: 02/02/2022. The wound has been in treatment 6 weeks. The wound is located on the Right,Dorsal Foot. The wound measures 3.5cm length x 1.9cm width x 0.1cm depth; 5.223cm^2 area and 0.522cm^3 volume. There is Fat Layer (Subcutaneous Tissue) exposed. There is no tunneling or undermining noted. There is a medium amount of serosanguineous drainage noted. The wound margin is distinct with the outline attached to the wound base. There is large (67-100%) red, pink granulation within the wound bed. There  is a small (1-33%) amount of necrotic tissue within the wound bed including Adherent Slough. The periwound skin appearance had no abnormalities noted for texture. The periwound skin appearance had no abnormalities noted for moisture. The periwound skin appearance had no abnormalities noted for color. Periwound temperature was noted as No Abnormality. The periwound has tenderness on palpation. Wound #2 status is Healed - Epithelialized. Original cause of wound was Thermal Burn. The date acquired was: 02/02/2022. The wound has been in treatment 6 weeks. The wound is located on the Right T Second. The wound measures 0cm length x 0cm width x 0cm depth; 0cm^2 area and 0cm^3 volume. There is no oe tunneling or undermining noted. There is a medium amount of serosanguineous drainage noted. The wound margin is distinct with the outline attached to the wound base. There is no granulation within the wound bed. There is no necrotic tissue within the wound bed. The periwound skin  appearance had no abnormalities noted for texture. The periwound skin appearance had no abnormalities noted for moisture. The periwound skin appearance had no abnormalities noted for color. Periwound temperature was noted as No Abnormality. The periwound has tenderness on palpation. Assessment Active Problems ICD-10 Burn of second degree of right lower leg, initial encounter Hypothyroidism, unspecified Cellulitis of right lower limb Patient's wound has shown improvement in size and appearance since last clinic visit. The toe wound is healed. I debrided nonviable tissue to the dorsal foot wound. I recommended switching the dressing to Medihoney and Hydrofera Blue now as we have more granulation tissue present. Continue with surgical shoe and aggressive offloading. Follow-up in 1 week. Procedures ELZABETH, MCQUERRY ([id number]) [id number] Page 6 of 7 Wound #1 Pre-procedure diagnosis of Wound #1 is a 2nd degree Burn located on the Right,Dorsal Foot . An Dressings and/or debridement of burns; small procedure was performed by Kalman Shan, DO. Post procedure Diagnosis Wound #1: Same as Pre-Procedure Notes: debrided with curette 5. documented by Deon Pilling, RN for Dr. Heber [place]. Plan Follow-up Appointments: Return Appointment in 1 week. - Dr. Heber [place] Anesthetic: (In clinic) Topical Lidocaine 5% applied to wound bed Bathing/ Shower/ Hygiene: May shower with protection but do not get wound dressing(s) wet. Edema Control - Lymphedema / SCD / Other: Elevate legs to the level of the heart or above for 30 minutes daily and/or when sitting, a frequency of: Avoid standing for long periods of time. WOUND #1: - Foot Wound Laterality: Dorsal, Right Cleanser: Soap and Water Every Other Day/15 Days Discharge Instructions: May shower and wash wound with dial antibacterial soap and water prior to dressing change. Cleanser: Wound Cleanser (Generic) Every Other Day/15  Days Discharge Instructions: Cleanse the wound with wound cleanser prior to applying a clean dressing using gauze sponges, not tissue or cotton balls. Prim Dressing: Hydrofera Blue Ready Foam, 2.5 x2.5 in Every Other Day/15 Days ary Discharge Instructions: Apply over the [place]. Prim Dressing: MediHoney Gel, tube 1.5 (oz) Every Other Day/15 Days ary Discharge Instructions: Apply to wound bed. Secondary Dressing: T Non-Adherent Dressing, 3x4 in (Generic) Every Other Day/15 Days elfa Discharge Instructions: Apply over primary dressing as directed. Secondary Dressing: Woven Gauze Sponge, Non-Sterile 4x4 in (Generic) Every Other Day/15 Days Discharge Instructions: Apply over primary dressing as directed. Secured With: [employer], 4.5x3.1 (in/yd) (Generic) Every Other Day/15 Days Discharge Instructions: Secure with Kerlix as directed. Secured With: 12M Medipore H Soft Cloth Surgical T ape, 4 x 10 (in/yd) (Generic) Every Other Day/15 Days Discharge Instructions: Secure with tape as directed.  Secured With: [employer] Net Size 5, 10 (yds) (Generic) Every Other Day/15 Days 1. In office sharp debridement 2. Medihoney and Hydrofera Blue 3. Aggressive offloadingoosurgical shoe Electronic Signature(s) Signed: 03/30/2022 2:06:49 PM By: Kalman Shan DO Entered By: Kalman Shan on 03/30/2022 14:04:25 -------------------------------------------------------------------------------- HxROS Details Patient Name: Date of Service: [name] [name], [name] 03/30/2022 1:00 PM Medical Record Number: [id number] Patient Account Number: [id number] Date of Birth/Sex: Treating RN: Aug 22, 1960 (61 y.o. F) Primary Care Provider: Leanna Battles Other Clinician: Referring Provider: Treating Provider/Extender: Darrold Junker in Treatment: 6 Information Obtained From Patient Chart Cardiovascular Medical History: Positive for: Hypertension Gastrointestinal Medical  History: Past Medical History Notes: GERD AWA, BACHICHA ([id number]) [id number] Page 7 of 7 Endocrine Medical History: Past Medical History Notes: thyroid disease Immunizations Pneumococcal Vaccine: Received Pneumococcal Vaccination: No Implantable Devices None Family and Social History Unknown History: Yes; Current every day smoker; Marital Status - Married; Alcohol Use: Moderate; Drug Use: No History; Caffeine Use: Moderate; Financial Concerns: No; Food, Clothing or Shelter Needs: No; Support System Lacking: No; Transportation Concerns: No Electronic Signature(s) Signed: 03/30/2022 2:06:49 PM By: Kalman Shan DO Entered By: Kalman Shan on 03/30/2022 14:02:46 -------------------------------------------------------------------------------- SuperBill Details Patient Name: Date of Service: [name] [name], A LLISO Delane Ginger E. 03/30/2022 Medical Record Number: [id number] Patient Account Number: [id number] Date of Birth/Sex: Treating RN: 01/10/1961 (61 y.o. F) Primary Care Provider: Leanna Battles Other Clinician: Referring Provider: Treating Provider/Extender: Darrold Junker in Treatment: 6 Diagnosis Coding ICD-10 Codes Code Description [phone] Burn of second degree of right lower leg, initial encounter E03.9 Hypothyroidism, unspecified L03.115 Cellulitis of right lower limb L97.518 Non-pressure chronic ulcer of other part of right foot with other specified severity Facility Procedures : CPT4 Code: [id number] Description: 16020 - BURN DRSG W/O ANESTH-SM ICD-10 Diagnosis Description T24.231A Burn of second degree of right lower leg, initial encounter L97.518 Non-pressure chronic ulcer of other part of right foot with other specified sev Modifier: erity Quantity: 1 Physician Procedures : CPT4 Code Description Modifier [id number] 16020 - WC PHYS DRESS/DEBRID SM,<5% TOT BODY SURF ICD-10 Diagnosis Description T24.231A Burn of  second degree of right lower leg, initial encounter L97.518 Non-pressure chronic ulcer of other part of right foot  with other specified severity Quantity: 1 Electronic Signature(s) Signed: 03/30/2022 2:06:49 PM By: Kalman Shan DO Entered By: Kalman Shan on 03/30/2022 14:06:13

## 2022-03-31 NOTE — Progress Notes (Signed)
CYDNIE, DEASON ([id number]) [id number] Page 1 of 9 Visit Report for 03/30/2022 Arrival Information Details Patient Name: Date of Service: Larey Dresser 03/30/2022 1:00 PM Medical Record Number: [id number] Patient Account Number: [id number] Date of Birth/Sex: Treating RN: Aug 21, 1960 (61 y.o. F) Primary Care Shawnique Mariotti: Leanna Battles Other Clinician: Referring Tye Juarez: Treating Jayde Daffin/Extender: Darrold Junker in Treatment: 6 Visit Information History Since Last Visit Added or deleted any medications: No Patient Arrived: Ambulatory Any new allergies or adverse reactions: No Arrival Time: 13:12 Had [name] fall or experienced change in No Accompanied By: self activities of daily living that may affect Transfer Assistance: None risk of falls: Patient Identification Verified: Yes Signs or symptoms of abuse/neglect since last visito No Secondary Verification Process Completed: Yes Hospitalized since last visit: No Patient Requires Transmission-Based Precautions: No Implantable device outside of the clinic excluding No Patient Has Alerts: No cellular tissue based products placed in the center since last visit: Has Dressing in Place as Prescribed: Yes Pain Present Now: No Electronic Signature(s) Signed: 03/30/2022 3:56:34 PM By: Erenest Blank Entered By: Erenest Blank on 03/30/2022 13:13:11 -------------------------------------------------------------------------------- Encounter Discharge Information Details Patient Name: Date of Service: [name] [name], [name] [name]. 03/30/2022 1:00 PM Medical Record Number: [id number] Patient Account Number: [id number] Date of Birth/Sex: Treating RN: [birth year] (61 y.o. [name] Primary Care Tiawana Forgy: Leanna Battles Other Clinician: Referring Marko Skalski: Treating Sharryn Belding/Extender: Darrold Junker in Treatment: 6 Encounter Discharge Information  Items Discharge Condition: Stable Ambulatory Status: Ambulatory Discharge Destination: Home Transportation: Private Auto Accompanied By: self Schedule Follow-up Appointment: Yes Clinical Summary of Care: Electronic Signature(s) Signed: 03/30/2022 5:53:09 PM By: Deon Pilling RN, BSN Entered By: Deon Pilling on 03/30/2022 13:54:41 Burgess Amor ([id number]) [id number] Page 2 of 9 -------------------------------------------------------------------------------- Lower Extremity Assessment Details Patient Name: Date of Service: [name] [name], [name] [name]. 03/30/2022 1:00 PM Medical Record Number: [id number] Patient Account Number: [id number] Date of Birth/Sex: Treating RN: [birth year] (61 y.o. F) Primary Care Edelmira Gallogly: Leanna Battles Other Clinician: Referring Kierston Plasencia: Treating London Tarnowski/Extender: Darrold Junker in Treatment: 6 Edema Assessment Assessed: [Left: No] [Right: No] Edema: [Left: N] [Right: o] Calf Left: Right: Point of Measurement: From Medial Instep 30 cm Ankle Left: Right: Point of Measurement: From Medial Instep 19 cm Electronic Signature(s) Signed: 03/30/2022 3:56:34 PM By: Erenest Blank Entered By: Erenest Blank on 03/30/2022 13:25:26 -------------------------------------------------------------------------------- Multi Wound Chart Details Patient Name: Date of Service: [name] [name], [name] [name]. 03/30/2022 1:00 PM Medical Record Number: [id number] Patient Account Number: [id number] Date of Birth/Sex: Treating RN: 01/30/1961 (61 y.o. F) Primary Care Latrail Pounders: Leanna Battles Other Clinician: Referring Aylanie Cubillos: Treating Kyri Shader/Extender: Darrold Junker in Treatment: 6 Vital Signs Height(in): 62 Pulse(bpm): 73 Weight(lbs): 110 Blood Pressure(mmHg): 172/102 Body Mass Index(BMI): 20.1 Temperature(F): 98.6 Respiratory Rate(breaths/min): 18 [1:Photos:] [N/[name]:N/[name]] Right,  Dorsal Foot Right T Second oe N/[name] Wound Location: Thermal Burn Thermal Burn N/[name] Wounding Event: 2nd degree Burn 2nd degree Burn N/[name] Primary Etiology: Hypertension Hypertension N/[name] Comorbid History: 02/02/2022 02/02/2022 N/[name] Date Acquired: 6 6 N/[name] [place] of Treatment: DAYTON, SHERR ([id number]) [id number] Page 3 of 9 Open Healed - Epithelialized N/[name] Wound Status: No No N/[name] Wound Recurrence: Yes No N/[name] Clustered Wound: 2 N/[name] N/[name] Clustered Quantity: 3.5x1.9x0.1 0x0x0 N/[name] Measurements L x W x D (cm) 5.223 0 N/[name] [name] (cm) : rea 0.522 0 N/[name] Volume (cm) : 88.90% 100.00% N/[name] % Reduction in Area: 94.50% 100.00% N/[name] % Reduction  in Volume: Full Thickness With Exposed Support Full Thickness With Exposed Support N/[name] Classification: Structures Structures Medium Medium N/[name] Exudate Amount: Serosanguineous Serosanguineous N/[name] Exudate Type: red, brown red, brown N/[name] Exudate Color: Distinct, outline attached Distinct, outline attached N/[name] Wound Margin: Large (67-100%) None Present (0%) N/[name] Granulation Amount: Red, Pink N/[name] N/[name] Granulation Quality: Small (1-33%) None Present (0%) N/[name] Necrotic Amount: Fat Layer (Subcutaneous Tissue): Yes Fascia: No N/[name] Exposed Structures: Fascia: No Fat Layer (Subcutaneous Tissue): No Tendon: No Tendon: No Muscle: No Muscle: No Joint: No Joint: No Bone: No Bone: No None Large (67-100%) N/[name] Epithelialization: Excoriation: No Excoriation: No N/[name] Periwound Skin Texture: Induration: No Induration: No Callus: No Callus: No Crepitus: No Crepitus: No Rash: No Rash: No Scarring: No Scarring: No Maceration: No Maceration: Yes N/[name] Periwound Skin Moisture: Dry/Scaly: No Dry/Scaly: No Atrophie Blanche: No Atrophie Blanche: No N/[name] Periwound Skin Color: Cyanosis: No Cyanosis: No Ecchymosis: No Ecchymosis: No Erythema: No Erythema: No Hemosiderin Staining: No Hemosiderin Staining: No Mottled:  No Mottled: No Pallor: No Pallor: No Rubor: No Rubor: No No Abnormality No Abnormality N/[name] Temperature: Yes Yes N/[name] Tenderness on Palpation: Dressings and/or debridement of N/[name] N/[name] Procedures Performed: burns; small Treatment Notes Wound #1 (Foot) Wound Laterality: Dorsal, Right Cleanser Soap and Water Discharge Instruction: May shower and wash wound with dial antibacterial soap and water prior to dressing change. Wound Cleanser Discharge Instruction: Cleanse the wound with wound cleanser prior to applying [name] clean dressing using gauze sponges, not tissue or cotton balls. Peri-Wound Care Topical Primary Dressing Hydrofera Blue Ready Foam, 2.5 x2.5 in Discharge Instruction: Apply over the [place]. MediHoney Gel, tube 1.5 (oz) Discharge Instruction: Apply to wound bed. Secondary Dressing T Non-Adherent Dressing, 3x4 in elfa Discharge Instruction: Apply over primary dressing as directed. Woven Gauze Sponge, Non-Sterile 4x4 in Discharge Instruction: Apply over primary dressing as directed. Secured With [employer], 4.5x3.1 (in/yd) Discharge Instruction: Secure with Kerlix as directed. 87M Medipore H Soft Cloth Surgical T ape, 4 x 10 (in/yd) Discharge Instruction: Secure with tape as directed. Stretch Net Size 5, 10 (yds) Compression VERNADINE, COOMBS ([id number]) [id number] Page 4 of 9 Compression Stockings Add-Ons Wound #2 (Toe Second) Wound Laterality: Right Cleanser Peri-Wound Care Topical Primary Dressing Secondary Dressing Secured With Compression Wrap Compression Stockings Add-Ons Electronic Signature(s) Signed: 03/30/2022 2:06:49 PM By: Kalman Shan DO Entered By: Kalman Shan on 03/30/2022 14:02:19 -------------------------------------------------------------------------------- Multi-Disciplinary Care Plan Details Patient Name: Date of Service: [name] [name], [name] [name]. 03/30/2022 1:00 PM Medical Record  Number: [id number] Patient Account Number: [id number] Date of Birth/Sex: Treating RN: [birth year] (61 y.o. [name], Meta.Reding Primary Care Sheyanne Munley: Leanna Battles Other Clinician: Referring Hallee Mckenny: Treating Abeer Iversen/Extender: Darrold Junker in Treatment: 6 Active Inactive Wound/Skin Impairment Nursing Diagnoses: Impaired tissue integrity Knowledge deficit related to smoking impact on wound healing Knowledge deficit related to ulceration/compromised skin integrity Goals: Patient will demonstrate [name] reduced rate of smoking or cessation of smoking Date Initiated: 02/16/2022 Target Resolution Date: 05/06/2022 Goal Status: Active Patient will have [name] decrease in wound volume by X% from date: (specify in notes) Date Initiated: 02/16/2022 Target Resolution Date: 05/13/2022 Goal Status: Active Patient/caregiver will verbalize understanding of skin care regimen Date Initiated: 02/16/2022 Target Resolution Date: 05/05/2022 Goal Status: Active Interventions: Assess patient/caregiver ability to obtain necessary supplies Assess patient/caregiver ability to perform ulcer/skin care regimen upon admission and as needed Assess ulceration(s) every visit Provide education on smoking Notes: Electronic Signature(s) LAVERE, SHINSKY ([id number]) [id number] Page 5 of  9 Signed: 03/30/2022 5:53:09 PM By: Deon Pilling RN, BSN Entered By: Deon Pilling on 03/30/2022 13:14:16 -------------------------------------------------------------------------------- Pain Assessment Details Patient Name: Date of Service: [name] [name], [name] [name]. 03/30/2022 1:00 PM Medical Record Number: [id number] Patient Account Number: [id number] Date of Birth/Sex: Treating RN: 08/08/1960 (61 y.o. F) Primary Care Viktoriya Glaspy: Leanna Battles Other Clinician: Referring Indiana Pechacek: Treating Roisin Mones/Extender: Darrold Junker in Treatment: 6 Active  Problems Location of Pain Severity and Description of Pain Patient Has Paino No Site Locations Pain Management and Medication Current Pain Management: Electronic Signature(s) Signed: 03/30/2022 3:56:34 PM By: Erenest Blank Entered By: Erenest Blank on 03/30/2022 13:25:12 -------------------------------------------------------------------------------- Patient/Caregiver Education Details Patient Name: Date of Service: [name] [name], [name] Verne Carrow 11/14/2023andnbsp1:00 PM Medical Record Number: [id number] Patient Account Number: [id number] Date of Birth/Gender: Treating RN: 07-09-1960 (61 y.o. [name] Primary Care Physician: Leanna Battles Other Clinician: Referring Physician: Treating Physician/Extender: Darrold Junker in Treatment: 6 Education Assessment Education Provided To: Patient MAURENE, HOLLIN ([id number]) [id number] Page 6 of 9 Education Topics Provided Wound/Skin Impairment: Handouts: Skin Care Do's and Dont's Methods: Explain/Verbal Responses: Reinforcements needed Electronic Signature(s) Signed: 03/30/2022 5:53:09 PM By: Deon Pilling RN, BSN Entered By: Deon Pilling on 03/30/2022 13:14:29 -------------------------------------------------------------------------------- Wound Assessment Details Patient Name: Date of Service: [name] [name], [name] [name]. 03/30/2022 1:00 PM Medical Record Number: [id number] Patient Account Number: [id number] Date of Birth/Sex: Treating RN: 07-Mar-1961 (61 y.o. F) Primary Care Kripa Foskey: Leanna Battles Other Clinician: Referring Louvina Cleary: Treating Ikeem Cleckler/Extender: Darrold Junker in Treatment: 6 Wound Status Wound Number: 1 Primary Etiology: 2nd degree Burn Wound Location: Right, Dorsal Foot Wound Status: Open Wounding Event: Thermal Burn Comorbid History: Hypertension Date Acquired: 02/02/2022 Weeks Of Treatment: 6 Clustered Wound:  Yes Photos Wound Measurements Length: (cm) Width: (cm) Depth: (cm) Clustered Quantity: Area: (cm) Volume: (cm) 3.5 % Reduction in Area: 88.9% 1.9 % Reduction in Volume: 94.5% 0.1 Epithelialization: None 2 Tunneling: No 5.223 Undermining: No 0.522 Wound Description Classification: Full Thickness With Exposed Suppo Wound Margin: Distinct, outline attached Exudate Amount: Medium Exudate Type: Serosanguineous Exudate Color: red, brown rt Structures Foul Odor After Cleansing: No Slough/Fibrino Yes Wound Bed Granulation Amount: Large (67-100%) Exposed Structure Granulation Quality: Red, Pink Fascia Exposed: No Necrotic Amount: Small (1-33%) Fat Layer (Subcutaneous Tissue) Exposed: Yes Necrotic Quality: Adherent Slough Tendon Exposed: No Muscle Exposed: No Joint Exposed: No Bone Exposed: No ELYANAH, FARINO ([id number]) [id number] Page 7 of 9 Periwound Skin Texture Texture Color No Abnormalities Noted: Yes No Abnormalities Noted: Yes Moisture Temperature / Pain No Abnormalities Noted: Yes Temperature: No Abnormality Tenderness on Palpation: Yes Treatment Notes Wound #1 (Foot) Wound Laterality: Dorsal, Right Cleanser Soap and Water Discharge Instruction: May shower and wash wound with dial antibacterial soap and water prior to dressing change. Wound Cleanser Discharge Instruction: Cleanse the wound with wound cleanser prior to applying [name] clean dressing using gauze sponges, not tissue or cotton balls. Peri-Wound Care Topical Primary Dressing Hydrofera Blue Ready Foam, 2.5 x2.5 in Discharge Instruction: Apply over the [place]. MediHoney Gel, tube 1.5 (oz) Discharge Instruction: Apply to wound bed. Secondary Dressing T Non-Adherent Dressing, 3x4 in elfa Discharge Instruction: Apply over primary dressing as directed. Woven Gauze Sponge, Non-Sterile 4x4 in Discharge Instruction: Apply over primary dressing as directed. Secured  With [employer], 4.5x3.1 (in/yd) Discharge Instruction: Secure with Kerlix as directed. 22M Medipore H Soft Cloth Surgical T ape, 4 x 10 (in/yd) Discharge Instruction: Secure with tape  as directed. Stretch Net Size 5, 10 (yds) Compression Wrap Compression Stockings Add-Ons Electronic Signature(s) Signed: 03/30/2022 3:56:34 PM By: Erenest Blank Entered By: Erenest Blank on 03/30/2022 13:22:40 -------------------------------------------------------------------------------- Wound Assessment Details Patient Name: Date of Service: [name] [name], [name] [name]. 03/30/2022 1:00 PM Medical Record Number: [id number] Patient Account Number: [id number] Date of Birth/Sex: Treating RN: November 14, 1960 (61 y.o. [name] Primary Care Jabari Swoveland: Leanna Battles Other Clinician: Referring Delvin Hedeen: Treating Halena Mohar/Extender: Darrold Junker in Treatment: 6 Wound Status Wound Number: 2 Primary Etiology: 2nd degree Burn Wound Location: Right T Second oe Wound Status: Healed - Epithelialized Wounding Event: Thermal Burn Comorbid History: Hypertension Date Acquired: 02/02/2022 [facility] Of Treatment: 6 AAILYAH, DUNBAR ([id number]) [id number] Page 8 of 9 Clustered Wound: No Photos Wound Measurements Length: (cm) Width: (cm) Depth: (cm) Area: (cm) Volume: (cm) 0 % Reduction in Area: 100% 0 % Reduction in Volume: 100% 0 Epithelialization: Large (67-100%) 0 Tunneling: No 0 Undermining: No Wound Description Classification: Full Thickness With Exposed Suppor Wound Margin: Distinct, outline attached Exudate Amount: Medium Exudate Type: Serosanguineous Exudate Color: red, brown t Structures Foul Odor After Cleansing: No Slough/Fibrino Yes Wound Bed Granulation Amount: None Present (0%) Exposed Structure Necrotic Amount: None Present (0%) Fascia Exposed: No Fat Layer (Subcutaneous Tissue) Exposed: No Tendon Exposed: No Muscle  Exposed: No Joint Exposed: No Bone Exposed: No Periwound Skin Texture Texture Color No Abnormalities Noted: Yes No Abnormalities Noted: Yes Moisture Temperature / Pain No Abnormalities Noted: Yes Temperature: No Abnormality Tenderness on Palpation: Yes Treatment Notes Wound #2 (Toe Second) Wound Laterality: Right Cleanser Peri-Wound Care Topical Primary Dressing Secondary Dressing Secured With Compression Wrap Compression Stockings Add-Ons Electronic Signature(s) Signed: 03/30/2022 5:53:09 PM By: Deon Pilling RN, BSN Entered By: Deon Pilling on 03/30/2022 13:48:57 Burgess Amor ([id number]) [id number] Page 9 of 9 -------------------------------------------------------------------------------- Vitals Details Patient Name: Date of Service: [name] [name], [name] [name]. 03/30/2022 1:00 PM Medical Record Number: [id number] Patient Account Number: [id number] Date of Birth/Sex: Treating RN: 1960-10-31 (61 y.o. F) Primary Care Shawnae Leiva: Leanna Battles Other Clinician: Referring Meliss Fleek: Treating Maddisen Vought/Extender: Darrold Junker in Treatment: 6 Vital Signs Time Taken: 13:13 Temperature (F): 98.6 Height (in): 62 Pulse (bpm): 79 Weight (lbs): 110 Respiratory Rate (breaths/min): 18 Body Mass Index (BMI): 20.1 Blood Pressure (mmHg): 172/102 Reference Range: 80 - 120 mg / dl Electronic Signature(s) Signed: 03/30/2022 3:56:34 PM By: Erenest Blank Entered By: Erenest Blank on 03/30/2022 13:25:02

## 2022-04-01 ENCOUNTER — Other Ambulatory Visit: Payer: Self-pay | Admitting: Obstetrics and Gynecology

## 2022-04-01 DIAGNOSIS — Z1231 Encounter for screening mammogram for malignant neoplasm of breast: Secondary | ICD-10-CM

## 2022-04-02 ENCOUNTER — Ambulatory Visit: Payer: Self-pay

## 2022-04-02 NOTE — Patient Outreach (Addendum)
  Care Coordination   04/02/2022 Name: [name] [name] MRN: [id number] DOB: Mar 11, 1961   Care Coordination Outreach Attempts:  An unsuccessful telephone outreach was attempted for a scheduled appointment today.  Follow Up Plan:  No further outreach attempts will be made at this time. We have been unable to contact the patient to offer or enroll patient in care coordination services  Encounter Outcome:  No Answer  Care Coordination Interventions Activated:  No   Care Coordination Interventions:  No, not indicated    Barb Merino, RN, BSN, CCM Care Management Coordinator [facility] Direct Phone: [phone]

## 2022-04-06 ENCOUNTER — Encounter (HOSPITAL_BASED_OUTPATIENT_CLINIC_OR_DEPARTMENT_OTHER): Payer: 59 | Admitting: Internal Medicine

## 2022-04-06 DIAGNOSIS — T25021A Burn of unspecified degree of right foot, initial encounter: Secondary | ICD-10-CM | POA: Diagnosis not present

## 2022-04-06 DIAGNOSIS — L97518 Non-pressure chronic ulcer of other part of right foot with other specified severity: Secondary | ICD-10-CM | POA: Diagnosis not present

## 2022-04-06 DIAGNOSIS — T24231A Burn of second degree of right lower leg, initial encounter: Secondary | ICD-10-CM | POA: Diagnosis not present

## 2022-04-06 DIAGNOSIS — L03115 Cellulitis of right lower limb: Secondary | ICD-10-CM | POA: Diagnosis not present

## 2022-04-06 DIAGNOSIS — E039 Hypothyroidism, unspecified: Secondary | ICD-10-CM

## 2022-04-06 DIAGNOSIS — I10 Essential (primary) hypertension: Secondary | ICD-10-CM | POA: Diagnosis not present

## 2022-04-06 DIAGNOSIS — W208XXA Other cause of strike by thrown, projected or falling object, initial encounter: Secondary | ICD-10-CM | POA: Diagnosis not present

## 2022-04-06 NOTE — Progress Notes (Signed)
[name], SAMPLE ([id number]) [id number] Page 1 of 7 Visit Report for 04/06/2022 Chief Complaint Document Details Patient Name: Date of Service: [name] [name] 04/06/2022 1:15 PM Medical Record Number: [id number] Patient Account Number: [id number] Date of Birth/Sex: Treating RN: 1961/05/14 (61 y.o. F) Primary Care Provider: Leanna Battles Other Clinician: Referring Provider: Treating Provider/Extender: Darrold Junker in Treatment: 7 Information Obtained from: Patient Chief Complaint 02/16/2022; burn to the right foot Electronic Signature(s) Signed: 04/06/2022 3:46:12 PM By: Kalman Shan DO Entered By: Kalman Shan on 04/06/2022 14:04:15 -------------------------------------------------------------------------------- HPI Details Patient Name: Date of Service: [name] [name], A LLISO Delane Ginger E. 04/06/2022 1:15 PM Medical Record Number: [id number] Patient Account Number: [id number] Date of Birth/Sex: Treating RN: [birth year] (61 y.o. F) Primary Care Provider: Leanna Battles Other Clinician: Referring Provider: Treating Provider/Extender: Darrold Junker in Treatment: 7 History of Present Illness HPI Description: Admission 02/16/2022 [name] is a 60 year old female with a past medical history of hypertension and hypothyroidism that presents to the clinic for 2-week history of burn to her right foot. She states she was cooking mashed potatoes and they fell on her foot resulting in a burn. She states she visited her primary care physician and was started on Silvadene. She had a follow-up visit and was prescribed doxycycline and mupirocin. She denies signs of infection currently. She reports minimal improvement in wound healing. 10/10; patient presents for follow-up. She states she has been using Santyl to the wound bed daily. She states she has used the entire tube over the past week. She  has increased warmth and erythema to the right foot. She states she completed doxycycline last week. 10/17; patient presents for follow-up. She just received Iodosorb in the mail and started this yesterday. She states that she squeezed out the last little bit of Santyl from the tube and has been using this to the wound bed. She is currently taking the clindamycin as prescribed at last clinic visit. She reports improvement in her symptoms. She has been using Medihoney to the toe wound. She has no issues or complaints today. 10/31; patient presents for follow-up. She states she has been using Iodosorb to the wound bed and bacitracin to the toe wound. She has no issues or complaints today. 11/14; patient presents for follow-up. She has been using Iodosorb to the wound bed. The toe wound has healed. She has no issues or complaints today. 11/21; patient presents for follow-up. She has been using Medihoney and Hydrofera Blue to the wound bed. She has no issues or complaints today. Electronic Signature(s) Signed: 04/06/2022 3:46:12 PM By: Kalman Shan DO Entered By: Kalman Shan on 04/06/2022 14:04:35 [name] [name] ([id number]) [id number] Page 2 of 7 -------------------------------------------------------------------------------- Physical Exam Details Patient Name: Date of Service: [name] [name], [name] 04/06/2022 1:15 PM Medical Record Number: [id number] Patient Account Number: [id number] Date of Birth/Sex: Treating RN: 11-04-1960 (61 y.o. F) Primary Care Provider: Leanna Battles Other Clinician: Referring Provider: Treating Provider/Extender: Darrold Junker in Treatment: 7 Constitutional respirations regular, non-labored and within target range for patient.. Cardiovascular 2+ dorsalis pedis/posterior tibialis pulses. Psychiatric pleasant and cooperative. Notes Right foot: T the dorsal aspect there is an open wound with  granulation tissue And fibrinous tissue. o Electronic Signature(s) Signed: 04/06/2022 3:46:12 PM By: Kalman Shan DO Entered By: Kalman Shan on 04/06/2022 14:05:06 -------------------------------------------------------------------------------- Physician Orders Details Patient Name: Date of Service: [name], [name] 04/06/2022 1:15 PM  Medical Record Number: [id number] Patient Account Number: [id number] Date of Birth/Sex: Treating RN: 08/29/1960 (61 y.o. [name], [name] Primary Care Provider: Leanna Battles Other Clinician: Referring Provider: Treating Provider/Extender: Darrold Junker in Treatment: 7 Verbal / Phone Orders: No Diagnosis Coding ICD-10 Coding Code Description H20.947S Burn of second degree of right lower leg, initial encounter E03.9 Hypothyroidism, unspecified L03.115 Cellulitis of right lower limb L97.518 Non-pressure chronic ulcer of other part of right foot with other specified severity Follow-up Appointments ppointment in 1 week. - Dr. Heber Henderson Return A ppointment in 2 weeks. - Dr. Heber  Return A Anesthetic (In clinic) Topical Lidocaine 5% applied to wound bed Bathing/ Shower/ Hygiene May shower with protection but do not get wound dressing(s) wet. Edema Control - Lymphedema / SCD / Other Elevate legs to the level of the heart or above for 30 minutes daily and/or when sitting, a frequency of: Avoid standing for long periods of time. Wound Treatment MARGE, VANDERMEULEN ([id number]) [phone].pdf Page 3 of 7 Wound #1 - Foot Wound Laterality: Dorsal, Right Cleanser: Soap and Water Every Other Day/15 Days Discharge Instructions: May shower and wash wound with dial antibacterial soap and water prior to dressing change. Cleanser: Wound Cleanser (Generic) Every Other Day/15 Days Discharge Instructions: Cleanse the wound with wound cleanser prior to applying a clean dressing using gauze sponges,  not tissue or cotton balls. Prim Dressing: Hydrofera Blue Ready Foam, 2.5 x2.5 in Every Other Day/15 Days ary Discharge Instructions: Apply over the [place]. Prim Dressing: MediHoney Gel, tube 1.5 (oz) Every Other Day/15 Days ary Discharge Instructions: Apply to wound bed. Secondary Dressing: Zetuvit Plus Silicone Border Dressing 4x4 (in/in) Every Other Day/15 Days Discharge Instructions: Apply silicone border over primary dressing as directed. Compression Wrap: tubigrip size D double to foot and leg. Every Other Day/15 Days Discharge Instructions: apply in the morning and remove at night. Electronic Signature(s) Signed: 04/06/2022 3:46:12 PM By: Kalman Shan DO Entered By: Kalman Shan on 04/06/2022 14:05:14 -------------------------------------------------------------------------------- Problem List Details Patient Name: Date of Service: [name] [name], [name] 04/06/2022 1:15 PM Medical Record Number: [id number] Patient Account Number: [id number] Date of Birth/Sex: Treating RN: 08/04/1960 (61 y.o. [name], [name] Primary Care Provider: Leanna Battles Other Clinician: Referring Provider: Treating Provider/Extender: Darrold Junker in Treatment: 7 Active Problems ICD-10 Encounter Code Description Active Date MDM Diagnosis T24.231A Burn of second degree of right lower leg, initial encounter 02/16/2022 No Yes E03.9 Hypothyroidism, unspecified 02/16/2022 No Yes L03.115 Cellulitis of right lower limb 02/23/2022 No Yes L97.518 Non-pressure chronic ulcer of other part of right foot with other specified 03/30/2022 No Yes severity Inactive Problems Resolved Problems Electronic Signature(s) Signed: 04/06/2022 3:46:12 PM By: Kalman Shan DO Entered By: Kalman Shan on 04/06/2022 14:04:01 [name] [name] ([id number]) [id number] Page 4 of  7 -------------------------------------------------------------------------------- Progress Note Details Patient Name: Date of Service: [name] [name], [name] 04/06/2022 1:15 PM Medical Record Number: [id number] Patient Account Number: [id number] Date of Birth/Sex: Treating RN: [birth year] (61 y.o. F) Primary Care Provider: Leanna Battles Other Clinician: Referring Provider: Treating Provider/Extender: Darrold Junker in Treatment: 7 Subjective Chief Complaint Information obtained from Patient 02/16/2022; burn to the right foot History of Present Illness (HPI) Admission 02/16/2022 [name] is a 61 year old female with a past medical history of hypertension and hypothyroidism that presents to the clinic for 2-week history of burn to her right foot. She states she was cooking mashed potatoes and they fell on her  foot resulting in a burn. She states she visited her primary care physician and was started on Silvadene. She had a follow-up visit and was prescribed doxycycline and mupirocin. She denies signs of infection currently. She reports minimal improvement in wound healing. 10/10; patient presents for follow-up. She states she has been using Santyl to the wound bed daily. She states she has used the entire tube over the past week. She has increased warmth and erythema to the right foot. She states she completed doxycycline last week. 10/17; patient presents for follow-up. She just received Iodosorb in the mail and started this yesterday. She states that she squeezed out the last little bit of Santyl from the tube and has been using this to the wound bed. She is currently taking the clindamycin as prescribed at last clinic visit. She reports improvement in her symptoms. She has been using Medihoney to the toe wound. She has no issues or complaints today. 10/31; patient presents for follow-up. She states she has been using Iodosorb to the wound bed and  bacitracin to the toe wound. She has no issues or complaints today. 11/14; patient presents for follow-up. She has been using Iodosorb to the wound bed. The toe wound has healed. She has no issues or complaints today. 11/21; patient presents for follow-up. She has been using Medihoney and Hydrofera Blue to the wound bed. She has no issues or complaints today. Patient History Information obtained from Patient, Chart. Family History Unknown History. Social History Current every day smoker, Marital Status - Married, Alcohol Use - Moderate, Drug Use - No History, Caffeine Use - Moderate. Medical History Cardiovascular Patient has history of Hypertension Medical A Surgical History Notes nd Gastrointestinal GERD Endocrine thyroid disease Objective Constitutional respirations regular, non-labored and within target range for patient.. Vitals Time Taken: 1:15 PM, Height: 62 in, Weight: 110 lbs, BMI: 20.1, Temperature: 99.1 F, Pulse: 87 bpm, Respiratory Rate: 18 breaths/min, Blood Pressure: 146/83 mmHg. Cardiovascular 2+ dorsalis pedis/posterior tibialis pulses. Psychiatric GIA, LUSHER ([id number]) [phone].pdf Page 5 of 7 pleasant and cooperative. General Notes: Right foot: T the dorsal aspect there is an open wound with granulation tissue And fibrinous tissue. o Integumentary (Hair, Skin) Wound #1 status is Open. Original cause of wound was Thermal Burn. The date acquired was: 02/02/2022. The wound has been in treatment 7 weeks. The wound is located on the Right,Dorsal Foot. The wound measures 3cm length x 1.7cm width x 0.1cm depth; 4.006cm^2 area and 0.401cm^3 volume. There is Fat Layer (Subcutaneous Tissue) exposed. There is no tunneling or undermining noted. There is a medium amount of serosanguineous drainage noted. The wound margin is distinct with the outline attached to the wound base. There is large (67-100%) red, pink granulation within the wound  bed. There is a small (1-33%) amount of necrotic tissue within the wound bed including Adherent Slough. The periwound skin appearance had no abnormalities noted for texture. The periwound skin appearance had no abnormalities noted for moisture. The periwound skin appearance had no abnormalities noted for color. Periwound temperature was noted as No Abnormality. The periwound has tenderness on palpation. Assessment Active Problems ICD-10 Burn of second degree of right lower leg, initial encounter Hypothyroidism, unspecified Cellulitis of right lower limb Non-pressure chronic ulcer of other part of right foot with other specified severity Patient's wound has shown improvement in size and appearance since last clinic visit. I recommended continuing the course with Hydrofera Blue and Medihoney. She may benefit from compression therapy. She does not have significant edema  on exam. She would probably be fine with just Tubigrip. We gave her this in office. Patient's insurance denied coverage for skin substitute. Follow-up in 1 week. Plan Follow-up Appointments: Return Appointment in 1 week. - Dr. Heber Harris Return Appointment in 2 weeks. - Dr. Heber Cortez Anesthetic: (In clinic) Topical Lidocaine 5% applied to wound bed Bathing/ Shower/ Hygiene: May shower with protection but do not get wound dressing(s) wet. Edema Control - Lymphedema / SCD / Other: Elevate legs to the level of the heart or above for 30 minutes daily and/or when sitting, a frequency of: Avoid standing for long periods of time. WOUND #1: - Foot Wound Laterality: Dorsal, Right Cleanser: Soap and Water Every Other Day/15 Days Discharge Instructions: May shower and wash wound with dial antibacterial soap and water prior to dressing change. Cleanser: Wound Cleanser (Generic) Every Other Day/15 Days Discharge Instructions: Cleanse the wound with wound cleanser prior to applying a clean dressing using gauze sponges, not tissue or cotton  balls. Prim Dressing: Hydrofera Blue Ready Foam, 2.5 x2.5 in Every Other Day/15 Days ary Discharge Instructions: Apply over the [place]. Prim Dressing: MediHoney Gel, tube 1.5 (oz) Every Other Day/15 Days ary Discharge Instructions: Apply to wound bed. Secondary Dressing: Zetuvit Plus Silicone Border Dressing 4x4 (in/in) Every Other Day/15 Days Discharge Instructions: Apply silicone border over primary dressing as directed. Com pression Wrap: tubigrip size D double to foot and leg. Every Other Day/15 Days Discharge Instructions: apply in the morning and remove at night. 1. Medihoney with Hydrofera Blue 2. Follow-up in 1 week 3. Tubigrip Electronic Signature(s) Signed: 04/06/2022 3:46:12 PM By: Kalman Shan DO Entered By: Kalman Shan on 04/06/2022 14:06:20 -------------------------------------------------------------------------------- HxROS Details Patient Name: Date of Service: [name] [name], [name] 04/06/2022 1:15 PM [name] [name] ([id number]) [phone].pdf Page 6 of 7 Medical Record Number: [id number] Patient Account Number: [id number] Date of Birth/Sex: Treating RN: 05/15/1961 (61 y.o. F) Primary Care Provider: Leanna Battles Other Clinician: Referring Provider: Treating Provider/Extender: Darrold Junker in Treatment: 7 Information Obtained From Patient Chart Cardiovascular Medical History: Positive for: Hypertension Gastrointestinal Medical History: Past Medical History Notes: GERD Endocrine Medical History: Past Medical History Notes: thyroid disease Immunizations Pneumococcal Vaccine: Received Pneumococcal Vaccination: No Implantable Devices None Family and Social History Unknown History: Yes; Current every day smoker; Marital Status - Married; Alcohol Use: Moderate; Drug Use: No History; Caffeine Use: Moderate; Financial Concerns: No; Food, Clothing or Shelter Needs: No; Support System  Lacking: No; Transportation Concerns: No Electronic Signature(s) Signed: 04/06/2022 3:46:12 PM By: Kalman Shan DO Entered By: Kalman Shan on 04/06/2022 14:04:39 -------------------------------------------------------------------------------- SuperBill Details Patient Name: Date of Service: [name] [name], A LLISO Delane Ginger E. 04/06/2022 Medical Record Number: [id number] Patient Account Number: [id number] Date of Birth/Sex: Treating RN: 1960-09-12 (61 y.o. [name], [name] Primary Care Provider: Leanna Battles Other Clinician: Referring Provider: Treating Provider/Extender: Darrold Junker in Treatment: 7 Diagnosis Coding ICD-10 Codes Code Description [phone] Burn of second degree of right lower leg, initial encounter E03.9 Hypothyroidism, unspecified L03.115 Cellulitis of right lower limb L97.518 Non-pressure chronic ulcer of other part of right foot with other specified severity Facility Procedures Physician Procedures : CPT4 Code Description Modifier [id number] - WC PHYS LEVEL 3 - EST PT ICD-10 Diagnosis Description T24.231A Burn of second degree of right lower leg, initial encounter L97.518 Non-pressure chronic ulcer of other part of right foot with other  specified severity E03.9 Hypothyroidism, unspecified Quantity: 1 Electronic Signature(s) Signed: 04/06/2022 3:46:12 PM By: Kalman Shan  DO Entered By: Kalman Shan on 04/06/2022 14:06:57

## 2022-04-07 NOTE — Progress Notes (Signed)
ANGELISSA, SUPAN ([id number]) [id number] Page 1 of 9 Visit Report for 04/06/2022 Arrival Information Details Patient Name: Date of Service: [name] [name] 04/06/2022 1:15 PM Medical Record Number: [id number] Patient Account Number: [id number] Date of Birth/Sex: Treating RN: Aug 21, 1960 (61 y.o. [name] [name] Primary Care Drina Jobst: Leanna Battles Other Clinician: Referring Ronae Noell: Treating Cortnie Ringel/Extender: Darrold Junker in Treatment: 7 Visit Information History Since Last Visit Added or deleted any medications: No Patient Arrived: Ambulatory Any new allergies or adverse reactions: No Arrival Time: 13:16 Had [name] fall or experienced change in No Accompanied By: Self activities of daily living that may affect Transfer Assistance: None risk of falls: Patient Identification Verified: Yes Signs or symptoms of abuse/neglect since last visito No Secondary Verification Process Completed: Yes Hospitalized since last visit: No Patient Requires Transmission-Based Precautions: No Implantable device outside of the clinic excluding No Patient Has Alerts: No cellular tissue based products placed in the center since last visit: Has Dressing in Place as Prescribed: Yes Pain Present Now: Yes Electronic Signature(s) Signed: 04/06/2022 3:44:28 PM By: Sharyn Creamer RN, BSN Entered By: Sharyn Creamer on 04/06/2022 13:17:25 -------------------------------------------------------------------------------- Clinic Level of Care Assessment Details Patient Name: Date of Service: [name] [name], [name] 04/06/2022 1:15 PM Medical Record Number: [id number] Patient Account Number: [id number] Date of Birth/Sex: Treating RN: November 27, 1960 (61 y.o. [name] Primary Care Mulan Adan: Leanna Battles Other Clinician: Referring Kennon Encinas: Treating Monteen Toops/Extender: Darrold Junker in Treatment: 7 Clinic Level of  Care Assessment Items TOOL 4 Quantity Score X- 1 0 Use when only an EandM is performed on FOLLOW-UP visit ASSESSMENTS - Nursing Assessment / Reassessment X- 1 10 Reassessment of Co-morbidities (includes updates in patient status) X- 1 5 Reassessment of Adherence to Treatment Plan ASSESSMENTS - Wound and Skin [name] ssessment / Reassessment X - Simple Wound Assessment / Reassessment - one wound 1 5 '[]'$  - 0 Complex Wound Assessment / Reassessment - multiple wounds '[]'$  - 0 Dermatologic / Skin Assessment (not related to wound area) ASSESSMENTS - Focused Assessment X- 1 5 Circumferential Edema Measurements - multi extremities '[]'$  - 0 Nutritional Assessment / Counseling / Intervention [name] [name], [name] [name] ([id number]) [phone] Page 2 of 9 '[]'$  - 0 Lower Extremity Assessment (monofilament, tuning fork, pulses) '[]'$  - 0 Peripheral Arterial Disease Assessment (using hand held doppler) ASSESSMENTS - Ostomy and/or Continence Assessment and Care '[]'$  - 0 Incontinence Assessment and Management '[]'$  - 0 Ostomy Care Assessment and Management (repouching, etc.) PROCESS - Coordination of Care X - Simple Patient / Family Education for ongoing care 1 15 '[]'$  - 0 Complex (extensive) Patient / Family Education for ongoing care X- 1 10 Staff obtains Programmer, systems, Records, T Results / Process Orders est '[]'$  - 0 Staff telephones HHA, Nursing Homes / Clarify orders / etc '[]'$  - 0 Routine Transfer to another Facility (non-emergent condition) '[]'$  - 0 Routine Hospital Admission (non-emergent condition) '[]'$  - 0 New Admissions / Biomedical engineer / Ordering NPWT Apligraf, etc. , '[]'$  - 0 Emergency Hospital Admission (emergent condition) X- 1 10 Simple Discharge Coordination '[]'$  - 0 Complex (extensive) Discharge Coordination PROCESS - Special Needs '[]'$  - 0 Pediatric / Minor Patient Management '[]'$  - 0 Isolation Patient Management '[]'$  - 0 Hearing / Language / Visual special needs '[]'$  -  0 Assessment of Community assistance (transportation, D/C planning, etc.) '[]'$  - 0 Additional assistance / Altered mentation '[]'$  - 0 Support Surface(s) Assessment (bed, cushion, seat, etc.) INTERVENTIONS - Wound Cleansing /  Measurement X - Simple Wound Cleansing - one wound 1 5 '[]'$  - 0 Complex Wound Cleansing - multiple wounds X- 1 5 Wound Imaging (photographs - any number of wounds) '[]'$  - 0 Wound Tracing (instead of photographs) X- 1 5 Simple Wound Measurement - one wound '[]'$  - 0 Complex Wound Measurement - multiple wounds INTERVENTIONS - Wound Dressings X - Small Wound Dressing one or multiple wounds 1 10 '[]'$  - 0 Medium Wound Dressing one or multiple wounds '[]'$  - 0 Large Wound Dressing one or multiple wounds '[]'$  - 0 Application of Medications - topical '[]'$  - 0 Application of Medications - injection INTERVENTIONS - Miscellaneous '[]'$  - 0 External ear exam '[]'$  - 0 Specimen Collection (cultures, biopsies, blood, body fluids, etc.) '[]'$  - 0 Specimen(s) / Culture(s) sent or taken to Lab for analysis '[]'$  - 0 Patient Transfer (multiple staff / Civil Service fast streamer / Similar devices) '[]'$  - 0 Simple Staple / Suture removal (25 or less) '[]'$  - 0 Complex Staple / Suture removal (26 or more) '[]'$  - 0 Hypo / Hyperglycemic Management (close monitor of Blood Glucose) [name] [name], [name] [name] ([id number]) [id number] Page 3 of 9 '[]'$  - 0 Ankle / Brachial Index (ABI) - do not check if billed separately X- 1 5 Vital Signs Has the patient been seen at the hospital within the last three years: Yes Total Score: 90 Level Of Care: New/Established - Level 3 Electronic Signature(s) Signed: 04/07/2022 10:43:44 AM By: Deon Pilling RN, BSN Entered By: Deon Pilling on 04/06/2022 13:49:09 -------------------------------------------------------------------------------- Encounter Discharge Information Details Patient Name: Date of Service: [name] [name], [name] LLISO Delane Ginger E. 04/06/2022 1:15 PM Medical Record  Number: [id number] Patient Account Number: [id number] Date of Birth/Sex: Treating RN: 1961-02-01 (61 y.o. [name] [name], [name] [name] Primary Care [name] [name]: Leanna Battles Other Clinician: Referring Ruthanne Mcneish: Treating Nirvana Blanchett/Extender: Darrold Junker in Treatment: 7 Encounter Discharge Information Items Discharge Condition: Stable Ambulatory Status: Ambulatory Discharge Destination: Home Transportation: Private Auto Accompanied By: self Schedule Follow-up Appointment: Yes Clinical Summary of Care: Electronic Signature(s) Signed: 04/07/2022 10:43:44 AM By: Deon Pilling RN, BSN Entered By: Deon Pilling on 04/06/2022 13:49:35 -------------------------------------------------------------------------------- Lower Extremity Assessment Details Patient Name: Date of Service: [name] [name], [name] 04/06/2022 1:15 PM Medical Record Number: [id number] Patient Account Number: [id number] Date of Birth/Sex: Treating RN: 26-Dec-1960 (61 y.o. [name] [name] Primary Care Markel Mergenthaler: Leanna Battles Other Clinician: Referring Samariya Rockhold: Treating Shooter Tangen/Extender: Darrold Junker in Treatment: 7 Edema Assessment Assessed: [Left: No] [Right: No] Edema: [Left: N] [Right: o] Calf Left: Right: Point of Measurement: From Medial Instep 29.5 cm Ankle Left: Right: Point of Measurement: From Medial Instep 18 cm Vascular Assessment [name] [name], [name] [name] ([id number]) [Right:[id number] Page 4 of 9] Pulses: Dorsalis Pedis Palpable: [Right:Yes] Electronic Signature(s) Signed: 04/06/2022 3:44:28 PM By: Sharyn Creamer RN, BSN Entered By: Sharyn Creamer on 04/06/2022 13:19:00 -------------------------------------------------------------------------------- Multi Wound Chart Details Patient Name: Date of Service: [name] [name], [name] LLISO Delane Ginger E. 04/06/2022 1:15 PM Medical Record Number: [id number] Patient Account Number: [id number] Date of  Birth/Sex: Treating RN: Jul 01, 1960 (61 y.o. F) Primary Care Arliss Hepburn: Leanna Battles Other Clinician: Referring Rekisha Welling: Treating Numa Heatwole/Extender: Darrold Junker in Treatment: 7 Vital Signs Height(in): 62 Pulse(bpm): 87 Weight(lbs): 110 Blood Pressure(mmHg): 146/83 Body Mass Index(BMI): 20.1 Temperature(F): 99.1 Respiratory Rate(breaths/min): 18 [1:Photos:] [N/[name]:N/[name]] Right, Dorsal Foot N/[name] N/[name] Wound Location: Thermal Burn N/[name] N/[name] Wounding Event: 2nd degree Burn N/[name] N/[name] Primary Etiology: Hypertension N/[name] N/[name] Comorbid History: 02/02/2022 N/[name] N/[name] Date Acquired: 7 N/[name]  N/[name] Weeks of Treatment: Open N/[name] N/[name] Wound Status: No N/[name] N/[name] Wound Recurrence: Yes N/[name] N/[name] Clustered Wound: 2 N/[name] N/[name] Clustered Quantity: 3x1.7x0.1 N/[name] N/[name] Measurements L x W x D (cm) 4.006 N/[name] N/[name] [name] (cm) : rea 0.401 N/[name] N/[name] Volume (cm) : 91.50% N/[name] N/[name] % Reduction in [name] rea: 95.70% N/[name] N/[name] % Reduction in Volume: Full Thickness With Exposed Support N/[name] N/[name] Classification: Structures Medium N/[name] N/[name] Exudate Amount: Serosanguineous N/[name] N/[name] Exudate Type: red, brown N/[name] N/[name] Exudate Color: Distinct, outline attached N/[name] N/[name] Wound Margin: Large (67-100%) N/[name] N/[name] Granulation Amount: Red, Pink N/[name] N/[name] Granulation Quality: Small (1-33%) N/[name] N/[name] Necrotic Amount: Fat Layer (Subcutaneous Tissue): Yes N/[name] N/[name] Exposed Structures: Fascia: No Tendon: No Muscle: No Joint: No Bone: No None N/[name] N/[name] Epithelialization: Excoriation: No N/[name] N/[name] Periwound Skin Texture: Induration: No Callus: No Crepitus: No [name] [name], [name] [name] ([id number]) [phone].pdf Page 5 of 9 Rash: No Scarring: No Maceration: No N/[name] N/[name] Periwound Skin Moisture: Dry/Scaly: No Atrophie Blanche: No N/[name] N/[name] Periwound Skin Color: Cyanosis: No Ecchymosis: No Erythema: No Hemosiderin Staining: No Mottled: No Pallor: No Rubor: No No Abnormality N/[name] N/[name] Temperature: Yes  N/[name] N/[name] Tenderness on Palpation: Treatment Notes Wound #1 (Foot) Wound Laterality: Dorsal, Right Cleanser Soap and Water Discharge Instruction: May shower and wash wound with dial antibacterial soap and water prior to dressing change. Wound Cleanser Discharge Instruction: Cleanse the wound with wound cleanser prior to applying [name] clean dressing using gauze sponges, not tissue or cotton balls. Peri-Wound Care Topical Primary Dressing Hydrofera Blue Ready Foam, 2.5 x2.5 in Discharge Instruction: Apply over the [place]. MediHoney Gel, tube 1.5 (oz) Discharge Instruction: Apply to wound bed. Secondary Dressing Zetuvit Plus Silicone Border Dressing 4x4 (in/in) Discharge Instruction: Apply silicone border over primary dressing as directed. Secured With Compression Wrap tubigrip size D double to foot and leg. Discharge Instruction: apply in the morning and remove at night. Compression Stockings Add-Ons Electronic Signature(s) Signed: 04/06/2022 3:46:12 PM By: Kalman Shan DO Entered By: Kalman Shan on 04/06/2022 14:04:08 -------------------------------------------------------------------------------- Multi-Disciplinary Care Plan Details Patient Name: Date of Service: [name] [name], [name] LLISO Delane Ginger E. 04/06/2022 1:15 PM Medical Record Number: [id number] Patient Account Number: [id number] Date of Birth/Sex: Treating RN: 03-16-1961 (61 y.o. [name] [name], [name] [name] Primary Care Irbin Fines: Leanna Battles Other Clinician: Referring Yulonda Wheeling: Treating Corbett Moulder/Extender: Darrold Junker in Treatment: 7 Active Inactive Wound/Skin Impairment [name] [name], [name] [name] ([id number]) [id number] Page 6 of 9 Nursing Diagnoses: Impaired tissue integrity Knowledge deficit related to smoking impact on wound healing Knowledge deficit related to ulceration/compromised skin integrity Goals: Patient will demonstrate [name] reduced rate of smoking or cessation of  smoking Date Initiated: 02/16/2022 Target Resolution Date: 05/06/2022 Goal Status: Active Patient will have [name] decrease in wound volume by X% from date: (specify in notes) Date Initiated: 02/16/2022 Target Resolution Date: 05/13/2022 Goal Status: Active Patient/caregiver will verbalize understanding of skin care regimen Date Initiated: 02/16/2022 Target Resolution Date: 05/05/2022 Goal Status: Active Interventions: Assess patient/caregiver ability to obtain necessary supplies Assess patient/caregiver ability to perform ulcer/skin care regimen upon admission and as needed Assess ulceration(s) every visit Provide education on smoking Notes: Electronic Signature(s) Signed: 04/07/2022 10:43:44 AM By: Deon Pilling RN, BSN Entered By: Deon Pilling on 04/06/2022 13:46:46 -------------------------------------------------------------------------------- Pain Assessment Details Patient Name: Date of Service: [name] [name], [name] 04/06/2022 1:15 PM Medical Record Number: [id number] Patient Account Number: [id number] Date of Birth/Sex: Treating RN: 02-18-1961 (61 y.o. [name] [name] Primary Care Tomio Kirk: Leanna Battles Other  Clinician: Referring Woodford Strege: Treating Edrie Ehrich/Extender: Darrold Junker in Treatment: 7 Active Problems Location of Pain Severity and Description of Pain Patient Has Paino Yes Site Locations Rate the pain. Current Pain Level: 5 Pain Management and Medication Current Pain Management: Electronic Signature(s) Signed: 04/06/2022 3:44:28 PM By: Sharyn Creamer RN, BSN [name] [name], [name] [name] ([id number]) By: Sharyn Creamer RN, BSN [phone].pdf Page 7 of 9 Signed: 04/06/2022 3:44:28 PM Entered By: Sharyn Creamer on 04/06/2022 13:18:01 -------------------------------------------------------------------------------- Patient/Caregiver Education Details Patient Name: Date of Service: [name] [name], [name]  11/21/2023andnbsp1:15 PM Medical Record Number: [id number] Patient Account Number: [id number] Date of Birth/Gender: Treating RN: December 13, 1960 (61 y.o. [name] Primary Care Physician: Leanna Battles Other Clinician: Referring Physician: Treating Physician/Extender: Darrold Junker in Treatment: 7 Education Assessment Education Provided To: Patient Education Topics Provided Wound/Skin Impairment: Handouts: Skin Care Do's and Dont's Methods: Explain/Verbal Responses: Reinforcements needed Electronic Signature(s) Signed: 04/07/2022 10:43:44 AM By: Deon Pilling RN, BSN Entered By: Deon Pilling on 04/06/2022 13:47:11 -------------------------------------------------------------------------------- Wound Assessment Details Patient Name: Date of Service: [name] [name], [name] 04/06/2022 1:15 PM Medical Record Number: [id number] Patient Account Number: [id number] Date of Birth/Sex: Treating RN: 1961/01/19 (61 y.o. [name] [name] Primary Care Maisie Hauser: Leanna Battles Other Clinician: Referring Mihaela Fajardo: Treating Gibson Telleria/Extender: Darrold Junker in Treatment: 7 Wound Status Wound Number: 1 Primary Etiology: 2nd degree Burn Wound Location: Right, Dorsal Foot Wound Status: Open Wounding Event: Thermal Burn Comorbid History: Hypertension Date Acquired: 02/02/2022 Weeks Of Treatment: 7 Clustered Wound: Yes Photos [name] [name], [name] [name] ([id number]) [id number].pdf Page 8 of 9 Wound Measurements Length: (cm) Width: (cm) Depth: (cm) Clustered Quantity: Area: (cm) Volume: (cm) 3 % Reduction in Area: 91.5% 1.7 % Reduction in Volume: 95.7% 0.1 Epithelialization: None 2 Tunneling: No 4.006 Undermining: No 0.401 Wound Description Classification: Full Thickness With Exposed Support Structures Wound Margin: Distinct, outline attached Exudate Amount: Medium Exudate Type: Serosanguineous Exudate  Color: red, brown Foul Odor After Cleansing: No Slough/Fibrino Yes Wound Bed Granulation Amount: Large (67-100%) Exposed Structure Granulation Quality: Red, Pink Fascia Exposed: No Necrotic Amount: Small (1-33%) Fat Layer (Subcutaneous Tissue) Exposed: Yes Necrotic Quality: Adherent Slough Tendon Exposed: No Muscle Exposed: No Joint Exposed: No Bone Exposed: No Periwound Skin Texture Texture Color No Abnormalities Noted: Yes No Abnormalities Noted: Yes Moisture Temperature / Pain No Abnormalities Noted: Yes Temperature: No Abnormality Tenderness on Palpation: Yes Treatment Notes Wound #1 (Foot) Wound Laterality: Dorsal, Right Cleanser Soap and Water Discharge Instruction: May shower and wash wound with dial antibacterial soap and water prior to dressing change. Wound Cleanser Discharge Instruction: Cleanse the wound with wound cleanser prior to applying [name] clean dressing using gauze sponges, not tissue or cotton balls. Peri-Wound Care Topical Primary Dressing Hydrofera Blue Ready Foam, 2.5 x2.5 in Discharge Instruction: Apply over the [place]. MediHoney Gel, tube 1.5 (oz) Discharge Instruction: Apply to wound bed. Secondary Dressing Zetuvit Plus Silicone Border Dressing 4x4 (in/in) Discharge Instruction: Apply silicone border over primary dressing as directed. Secured With Compression Wrap tubigrip size D double to foot and leg. Discharge Instruction: apply in the morning and remove at night. MONIK, LINS ([id number]) [id number] Page 9 of 9 Compression Stockings Add-Ons Electronic Signature(s) Signed: 04/06/2022 3:44:28 PM By: Sharyn Creamer RN, BSN Entered By: Sharyn Creamer on 04/06/2022 13:20:36 -------------------------------------------------------------------------------- Vitals Details Patient Name: Date of Service: [name] [name], [name] 04/06/2022 1:15 PM Medical Record Number: [id number] Patient Account Number:  [id number] Date of Birth/Sex: Treating  RN: 1961/05/13 (61 y.o. [name] [name] Primary Care Jeriann Sayres: Leanna Battles Other Clinician: Referring Jodiann Ognibene: Treating Artia Singley/Extender: Darrold Junker in Treatment: 7 Vital Signs Time Taken: 13:15 Temperature (F): 99.1 Height (in): 62 Pulse (bpm): 87 Weight (lbs): 110 Respiratory Rate (breaths/min): 18 Body Mass Index (BMI): 20.1 Blood Pressure (mmHg): 146/83 Reference Range: 80 - 120 mg / dl Electronic Signature(s) Signed: 04/06/2022 3:44:28 PM By: Sharyn Creamer RN, BSN Entered By: Sharyn Creamer on 04/06/2022 13:17:52

## 2022-04-12 ENCOUNTER — Encounter (HOSPITAL_BASED_OUTPATIENT_CLINIC_OR_DEPARTMENT_OTHER): Payer: 59 | Admitting: Internal Medicine

## 2022-04-12 DIAGNOSIS — L03115 Cellulitis of right lower limb: Secondary | ICD-10-CM | POA: Diagnosis not present

## 2022-04-12 DIAGNOSIS — E039 Hypothyroidism, unspecified: Secondary | ICD-10-CM

## 2022-04-12 DIAGNOSIS — L97518 Non-pressure chronic ulcer of other part of right foot with other specified severity: Secondary | ICD-10-CM | POA: Diagnosis not present

## 2022-04-12 DIAGNOSIS — W208XXA Other cause of strike by thrown, projected or falling object, initial encounter: Secondary | ICD-10-CM | POA: Diagnosis not present

## 2022-04-12 DIAGNOSIS — T24231A Burn of second degree of right lower leg, initial encounter: Secondary | ICD-10-CM

## 2022-04-12 DIAGNOSIS — I10 Essential (primary) hypertension: Secondary | ICD-10-CM | POA: Diagnosis not present

## 2022-04-12 DIAGNOSIS — T25021A Burn of unspecified degree of right foot, initial encounter: Secondary | ICD-10-CM | POA: Diagnosis not present

## 2022-04-12 NOTE — Progress Notes (Signed)
[name], [name] ([id number]) [id number] Page 1 of 7 Visit Report for 04/12/2022 Chief Complaint Document Details Patient Name: Date of Service: [name] [name] 04/12/2022 2:45 PM Medical Record Number: [id number] Patient Account Number: [id number] Date of Birth/Sex: Treating RN: [birth year] (61 y.o. F) Primary Care Provider: Leanna Battles Other Clinician: Referring Provider: Treating Provider/Extender: Darrold Junker in Treatment: 7 Information Obtained from: Patient Chief Complaint 02/16/2022; burn to the right foot Electronic Signature(s) Signed: 04/12/2022 4:00:28 PM By: Kalman Shan DO Entered By: Kalman Shan on 04/12/2022 15:57:48 -------------------------------------------------------------------------------- HPI Details Patient Name: Date of Service: [name] [name], [name] LLISO Delane Ginger E. 04/12/2022 2:45 PM Medical Record Number: [id number] Patient Account Number: [id number] Date of Birth/Sex: Treating RN: Aug 13, 1960 (61 y.o. F) Primary Care Provider: Leanna Battles Other Clinician: Referring Provider: Treating Provider/Extender: Darrold Junker in Treatment: 7 History of Present Illness HPI Description: Admission 02/16/2022 [name] [name] is [name] 61 year old female with [name] past medical history of hypertension and hypothyroidism that presents to the clinic for 2-week history of burn to her right foot. She states she was cooking mashed potatoes and they fell on her foot resulting in [name] burn. She states she visited her primary care physician and was started on Silvadene. She had [name] follow-up visit and was prescribed doxycycline and mupirocin. She denies signs of infection currently. She reports minimal improvement in wound healing. 10/10; patient presents for follow-up. She states she has been using Santyl to the wound bed daily. She states she has used the entire tube over the past week. She  has increased warmth and erythema to the right foot. She states she completed doxycycline last week. 10/17; patient presents for follow-up. She just received Iodosorb in the mail and started this yesterday. She states that she squeezed out the last little bit of Santyl from the tube and has been using this to the wound bed. She is currently taking the clindamycin as prescribed at last clinic visit. She reports improvement in her symptoms. She has been using Medihoney to the toe wound. She has no issues or complaints today. 10/31; patient presents for follow-up. She states she has been using Iodosorb to the wound bed and bacitracin to the toe wound. She has no issues or complaints today. 11/14; patient presents for follow-up. She has been using Iodosorb to the wound bed. The toe wound has healed. She has no issues or complaints today. 11/21; patient presents for follow-up. She has been using Medihoney and Hydrofera Blue to the wound bed. She has no issues or complaints today. 11/27; Patient presents for follow up. She has been using medihoney and hydrofera blue to the wound bed. She had [name] hard time getting the Tubigrip on and has not been using this. Electronic Signature(s) Signed: 04/12/2022 4:00:28 PM By: Kalman Shan DO Entered By: Kalman Shan on 04/12/2022 15:58:39 Burgess Amor ([id number]) [id number] Page 2 of 7 -------------------------------------------------------------------------------- Physical Exam Details Patient Name: Date of Service: [name] [name], [name] Hollie Salk E. 04/12/2022 2:45 PM Medical Record Number: [id number] Patient Account Number: [id number] Date of Birth/Sex: Treating RN: 1960/08/29 (61 y.o. F) Primary Care Provider: Leanna Battles Other Clinician: Referring Provider: Treating Provider/Extender: Darrold Junker in Treatment: 7 Constitutional respirations regular, non-labored and within target range for  patient.. Cardiovascular 2+ dorsalis pedis/posterior tibialis pulses. Psychiatric pleasant and cooperative. Notes Right foot: T the dorsal aspect there is an open wound with granulation tissue And fibrinous tissue. o Electronic Signature(s) Signed:  04/12/2022 4:00:28 PM By: Kalman Shan DO Entered By: Kalman Shan on 04/12/2022 15:58:55 -------------------------------------------------------------------------------- Physician Orders Details Patient Name: Date of Service: [name] [name], [name] LLISO Delane Ginger E. 04/12/2022 2:45 PM Medical Record Number: [id number] Patient Account Number: [id number] Date of Birth/Sex: Treating RN: [birth year] (61 y.o. [name] [name], [name] [name] Primary Care Provider: Leanna Battles Other Clinician: Referring Provider: Treating Provider/Extender: Darrold Junker in Treatment: 7 Verbal / Phone Orders: No Diagnosis Coding ICD-10 Coding Code Description J49.702O Burn of second degree of right lower leg, initial encounter E03.9 Hypothyroidism, unspecified L03.115 Cellulitis of right lower limb L97.518 Non-pressure chronic ulcer of other part of right foot with other specified severity Follow-up Appointments ppointment in 1 week. - Dr. Heber [place] Return [name] ppointment in 2 weeks. - Dr. Heber [place] Return [name] Anesthetic (In clinic) Topical Lidocaine 5% applied to wound bed Bathing/ Shower/ Hygiene May shower with protection but do not get wound dressing(s) wet. Edema Control - Lymphedema / SCD / Other Elevate legs to the level of the heart or above for 30 minutes daily and/or when sitting, [name] frequency of: Avoid standing for long periods of time. Wound Treatment SIERA, BEYERSDORF ([id number]) [id number] Page 3 of 7 Wound #1 - Foot Wound Laterality: Dorsal, Right Cleanser: Soap and Water 1 x Per Week/15 Days Discharge Instructions: May shower and wash wound with dial antibacterial soap and water prior to dressing  change. Cleanser: Wound Cleanser (Generic) 1 x Per Week/15 Days Discharge Instructions: Cleanse the wound with wound cleanser prior to applying [name] clean dressing using gauze sponges, not tissue or cotton balls. Topical: Gentamicin 1 x Per Week/15 Days Discharge Instructions: As directed by physician Topical: Mupirocin Ointment 1 x Per Week/15 Days Discharge Instructions: Apply Mupirocin (Bactroban) as instructed Prim Dressing: Hydrofera Blue Ready Foam, 2.5 x2.5 in 1 x Per Week/15 Days ary Discharge Instructions: Apply over the [place]. Secondary Dressing: ABD Pad, 5x9 1 x Per Week/15 Days Discharge Instructions: Apply over primary dressing as directed. Compression Wrap: Kerlix Roll 4.5x3.1 (in/yd) 1 x Per Week/15 Days Discharge Instructions: Apply Kerlix and Coban compression as directed. Compression Wrap: Coban Self-Adherent Wrap 4x5 (in/yd) 1 x Per Week/15 Days Discharge Instructions: Apply over Kerlix as directed. Electronic Signature(s) Signed: 04/12/2022 4:00:28 PM By: Kalman Shan DO Entered By: Kalman Shan on 04/12/2022 15:59:01 -------------------------------------------------------------------------------- Problem List Details Patient Name: Date of Service: [name] [name], [name] LLISO Delane Ginger E. 04/12/2022 2:45 PM Medical Record Number: [id number] Patient Account Number: [id number] Date of Birth/Sex: Treating RN: 1961-02-11 (61 y.o. F) Primary Care Provider: Leanna Battles Other Clinician: Referring Provider: Treating Provider/Extender: Darrold Junker in Treatment: 7 Active Problems ICD-10 Encounter Code Description Active Date MDM Diagnosis T24.231A Burn of second degree of right lower leg, initial encounter 02/16/2022 No Yes E03.9 Hypothyroidism, unspecified 02/16/2022 No Yes L03.115 Cellulitis of right lower limb 02/23/2022 No Yes L97.518 Non-pressure chronic ulcer of other part of right foot with other specified 03/30/2022 No  Yes severity Inactive Problems Resolved Problems LAWSON, ISABELL ([id number]) [id number] Page 4 of 7 Electronic Signature(s) Signed: 04/12/2022 4:00:28 PM By: Kalman Shan DO Entered By: Kalman Shan on 04/12/2022 15:57:36 -------------------------------------------------------------------------------- Progress Note Details Patient Name: Date of Service: [name] [name], [name] LLISO Delane Ginger E. 04/12/2022 2:45 PM Medical Record Number: [id number] Patient Account Number: [id number] Date of Birth/Sex: Treating RN: February 23, 1961 (61 y.o. F) Primary Care Provider: Leanna Battles Other Clinician: Referring Provider: Treating Provider/Extender: Darrold Junker in Treatment: 7 Subjective Chief Complaint Information obtained from Patient 02/16/2022;  burn to the right foot History of Present Illness (HPI) Admission 02/16/2022 [name] is [name] 61 year old female with [name] past medical history of hypertension and hypothyroidism that presents to the clinic for 2-week history of burn to her right foot. She states she was cooking mashed potatoes and they fell on her foot resulting in [name] burn. She states she visited her primary care physician and was started on Silvadene. She had [name] follow-up visit and was prescribed doxycycline and mupirocin. She denies signs of infection currently. She reports minimal improvement in wound healing. 10/10; patient presents for follow-up. She states she has been using Santyl to the wound bed daily. She states she has used the entire tube over the past week. She has increased warmth and erythema to the right foot. She states she completed doxycycline last week. 10/17; patient presents for follow-up. She just received Iodosorb in the mail and started this yesterday. She states that she squeezed out the last little bit of Santyl from the tube and has been using this to the wound bed. She is currently taking the clindamycin  as prescribed at last clinic visit. She reports improvement in her symptoms. She has been using Medihoney to the toe wound. She has no issues or complaints today. 10/31; patient presents for follow-up. She states she has been using Iodosorb to the wound bed and bacitracin to the toe wound. She has no issues or complaints today. 11/14; patient presents for follow-up. She has been using Iodosorb to the wound bed. The toe wound has healed. She has no issues or complaints today. 11/21; patient presents for follow-up. She has been using Medihoney and Hydrofera Blue to the wound bed. She has no issues or complaints today. 11/27; Patient presents for follow up. She has been using medihoney and hydrofera blue to the wound bed. She had [name] hard time getting the Tubigrip on and has not been using this. Patient History Information obtained from Patient, Chart. Family History Unknown History. Social History Current every day smoker, Marital Status - Married, Alcohol Use - Moderate, Drug Use - No History, Caffeine Use - Moderate. Medical History Cardiovascular Patient has history of Hypertension Medical [name] Surgical History Notes nd Gastrointestinal GERD Endocrine thyroid disease Objective Constitutional ARLYNN, STARE ([id number]) [id number] Page 5 of 7 respirations regular, non-labored and within target range for patient.. Vitals Time Taken: 3:09 PM, Height: 62 in, Weight: 110 lbs, BMI: 20.1, Temperature: 98.4 F, Pulse: 83 bpm, Respiratory Rate: 18 breaths/min, Blood Pressure: 162/84 mmHg. Cardiovascular 2+ dorsalis pedis/posterior tibialis pulses. Psychiatric pleasant and cooperative. General Notes: Right foot: T the dorsal aspect there is an open wound with granulation tissue And fibrinous tissue. o Integumentary (Hair, Skin) Wound #1 status is Open. Original cause of wound was Thermal Burn. The date acquired was: 02/02/2022. The wound has been in treatment 7  weeks. The wound is located on the Right,Dorsal Foot. The wound measures 2.9cm length x 1.4cm width x 0.1cm depth; 3.189cm^2 area and 0.319cm^3 volume. There is Fat Layer (Subcutaneous Tissue) exposed. There is no tunneling or undermining noted. There is [name] medium amount of serosanguineous drainage noted. The wound margin is distinct with the outline attached to the wound base. There is large (67-100%) red, pink granulation within the wound bed. There is [name] small (1-33%) amount of necrotic tissue within the wound bed including Adherent Slough. The periwound skin appearance had no abnormalities noted for texture. The periwound skin appearance had no abnormalities noted for moisture. The periwound skin appearance had  no abnormalities noted for color. Periwound temperature was noted as No Abnormality. The periwound has tenderness on palpation. Assessment Active Problems ICD-10 Burn of second degree of right lower leg, initial encounter Hypothyroidism, unspecified Cellulitis of right lower limb Non-pressure chronic ulcer of other part of right foot with other specified severity Patient's wound appears well-healing. Unfortunately she has [name] hard time putting on compression stockings/Tubigrip. At this time I recommended [name] compression wrap to see if this will help facilitate further wound healing. I recommended antibiotic ointment to address any bioburden. Continue Hydrofera Blue. Follow-up in 1 week. She knows to not get the wrap wet or keep this on for more than 1 week. Plan Follow-up Appointments: Return Appointment in 1 week. - Dr. Heber Haakon Return Appointment in 2 weeks. - Dr. Heber [place] Anesthetic: (In clinic) Topical Lidocaine 5% applied to wound bed Bathing/ Shower/ Hygiene: May shower with protection but do not get wound dressing(s) wet. Edema Control - Lymphedema / SCD / Other: Elevate legs to the level of the heart or above for 30 minutes daily and/or when sitting, [name] frequency of: Avoid  standing for long periods of time. WOUND #1: - Foot Wound Laterality: Dorsal, Right Cleanser: Soap and Water 1 x Per Week/15 Days Discharge Instructions: May shower and wash wound with dial antibacterial soap and water prior to dressing change. Cleanser: Wound Cleanser (Generic) 1 x Per Week/15 Days Discharge Instructions: Cleanse the wound with wound cleanser prior to applying [name] clean dressing using gauze sponges, not tissue or cotton balls. Topical: Gentamicin 1 x Per Week/15 Days Discharge Instructions: As directed by physician Topical: Mupirocin Ointment 1 x Per Week/15 Days Discharge Instructions: Apply Mupirocin (Bactroban) as instructed Prim Dressing: Hydrofera Blue Ready Foam, 2.5 x2.5 in 1 x Per Week/15 Days ary Discharge Instructions: Apply over the [place]. Secondary Dressing: ABD Pad, 5x9 1 x Per Week/15 Days Discharge Instructions: Apply over primary dressing as directed. Com pression Wrap: Kerlix Roll 4.5x3.1 (in/yd) 1 x Per Week/15 Days Discharge Instructions: Apply Kerlix and Coban compression as directed. Com pression Wrap: Coban Self-Adherent Wrap 4x5 (in/yd) 1 x Per Week/15 Days Discharge Instructions: Apply over Kerlix as directed. 1. Antibiotic ointment with Hydrofera Blue under Kerlix/Coban 2. Follow-up in 1 week Electronic Signature(s) Signed: 04/12/2022 4:00:28 PM By: Kalman Shan DO Entered By: Kalman Shan on 04/12/2022 15:59:59 Burgess Amor ([id number]) [id number] Page 6 of 7 -------------------------------------------------------------------------------- HxROS Details Patient Name: Date of Service: [name] [name], [name] 04/12/2022 2:45 PM Medical Record Number: [id number] Patient Account Number: [id number] Date of Birth/Sex: Treating RN: 1960-11-17 (61 y.o. F) Primary Care Provider: Leanna Battles Other Clinician: Referring Provider: Treating Provider/Extender: Darrold Junker in  Treatment: 7 Information Obtained From Patient Chart Cardiovascular Medical History: Positive for: Hypertension Gastrointestinal Medical History: Past Medical History Notes: GERD Endocrine Medical History: Past Medical History Notes: thyroid disease Immunizations Pneumococcal Vaccine: Received Pneumococcal Vaccination: No Implantable Devices None Family and Social History Unknown History: Yes; Current every day smoker; Marital Status - Married; Alcohol Use: Moderate; Drug Use: No History; Caffeine Use: Moderate; Financial Concerns: No; Food, Clothing or Shelter Needs: No; Support System Lacking: No; Transportation Concerns: No Electronic Signature(s) Signed: 04/12/2022 4:00:28 PM By: Kalman Shan DO Entered By: Kalman Shan on 04/12/2022 15:58:43 -------------------------------------------------------------------------------- SuperBill Details Patient Name: Date of Service: [name] [name], [name] LLISO Delane Ginger E. 04/12/2022 Medical Record Number: [id number] Patient Account Number: [id number] Date of Birth/Sex: Treating RN: 1960-12-20 (61 y.o. F) Primary Care Provider: Leanna Battles Other Clinician: Referring Provider:  Treating Provider/Extender: Darrold Junker in Treatment: 7 Diagnosis Coding ICD-10 Codes Code Description VANELLOPE, PASSMORE ([id number]) [phone].pdf Page 7 of 7 T24.231A Burn of second degree of right lower leg, initial encounter E03.9 Hypothyroidism, unspecified L03.115 Cellulitis of right lower limb L97.518 Non-pressure chronic ulcer of other part of right foot with other specified severity Facility Procedures : CPT4 Code: [id number] Description: 99213 - WOUND CARE VISIT-LEV 3 EST PT Modifier: Quantity: 1 Physician Procedures : CPT4 Code Description Modifier [id number] 99213 - WC PHYS LEVEL 3 - EST PT ICD-10 Diagnosis Description D59.470R Burn of second degree of right lower leg, initial encounter E03.9  Hypothyroidism, unspecified L03.115 Cellulitis of right lower limb L97.518  Non-pressure chronic ulcer of other part of right foot with other specified severity Quantity: 1 Electronic Signature(s) Unsigned Previous Signature: 04/12/2022 4:00:28 PM Version By: Kalman Shan DO Entered By: Deon Pilling on 04/12/2022 17:15:27 Signature(s): Date(s):

## 2022-04-13 NOTE — Progress Notes (Signed)
KOBI, ALLER ([id number]) [id number] Page 1 of 8 Visit Report for 04/12/2022 Arrival Information Details Patient Name: Date of Service: [name] [name] 04/12/2022 2:45 PM Medical Record Number: [id number] Patient Account Number: [id number] Date of Birth/Sex: Treating RN: Mar 05, 1961 (61 y.o. F) Primary Care Durk Carmen: Leanna Battles Other Clinician: Referring Chukwuemeka Artola: Treating [place]/Extender: [name] [name] in Treatment: 7 Visit Information History Since Last Visit Added or deleted any medications: No Patient Arrived: Ambulatory Any new allergies or adverse reactions: No Arrival Time: 15:07 Had a fall or experienced change in No Accompanied By: self activities of daily living that may affect Transfer Assistance: None risk of falls: Patient Identification Verified: Yes Signs or symptoms of abuse/neglect since last visito No Secondary Verification Process Completed: Yes Hospitalized since last visit: No Patient Requires Transmission-Based Precautions: No Implantable device outside of the clinic excluding No Patient Has Alerts: No cellular tissue based products placed in the center since last visit: Has Dressing in Place as Prescribed: Yes Pain Present Now: No Electronic Signature(s) Signed: 04/13/2022 4:06:07 PM By: Erenest Blank Entered By: Erenest Blank on 04/12/2022 15:08:53 -------------------------------------------------------------------------------- Clinic Level of Care Assessment Details Patient Name: Date of Service: [name] [name], A Hollie Salk E. 04/12/2022 2:45 PM Medical Record Number: [id number] Patient Account Number: [id number] Date of Birth/Sex: Treating RN: 1961-03-05 (61 y.o. [name] Primary Care Foster Frericks: Leanna Battles Other Clinician: Referring Iriana Artley: Treating Rainbow Salman/Extender: [name] [name] in Treatment: 7 Clinic Level of Care Assessment  Items TOOL 4 Quantity Score X- 1 0 Use when only an EandM is performed on FOLLOW-UP visit ASSESSMENTS - Nursing Assessment / Reassessment X- 1 10 Reassessment of Co-morbidities (includes updates in patient status) X- 1 5 Reassessment of Adherence to Treatment Plan ASSESSMENTS - Wound and Skin A ssessment / Reassessment X - Simple Wound Assessment / Reassessment - one wound 1 5 '[]'$  - 0 Complex Wound Assessment / Reassessment - multiple wounds '[]'$  - 0 Dermatologic / Skin Assessment (not related to wound area) ASSESSMENTS - Focused Assessment X- 1 5 Circumferential Edema Measurements - multi extremities '[]'$  - 0 Nutritional Assessment / Counseling / Intervention [name] [name], [name] [name] ([id number]) [id number] Page 2 of 8 '[]'$  - 0 Lower Extremity Assessment (monofilament, tuning fork, pulses) '[]'$  - 0 Peripheral Arterial Disease Assessment (using hand held doppler) ASSESSMENTS - Ostomy and/or Continence Assessment and Care '[]'$  - 0 Incontinence Assessment and Management '[]'$  - 0 Ostomy Care Assessment and Management (repouching, etc.) PROCESS - Coordination of Care X - Simple Patient / Family Education for ongoing care 1 15 '[]'$  - 0 Complex (extensive) Patient / Family Education for ongoing care X- 1 10 Staff obtains Programmer, systems, Records, T Results / Process Orders est '[]'$  - 0 Staff telephones HHA, Nursing Homes / Clarify orders / etc '[]'$  - 0 Routine Transfer to another Facility (non-emergent condition) '[]'$  - 0 Routine Hospital Admission (non-emergent condition) '[]'$  - 0 New Admissions / Biomedical engineer / Ordering NPWT Apligraf, etc. , '[]'$  - 0 Emergency Hospital Admission (emergent condition) X- 1 10 Simple Discharge Coordination '[]'$  - 0 Complex (extensive) Discharge Coordination PROCESS - Special Needs '[]'$  - 0 Pediatric / Minor Patient Management '[]'$  - 0 Isolation Patient Management '[]'$  - 0 Hearing / Language / Visual special needs '[]'$  - 0 Assessment of  Community assistance (transportation, D/C planning, etc.) '[]'$  - 0 Additional assistance / Altered mentation '[]'$  - 0 Support Surface(s) Assessment (bed, cushion, seat, etc.) INTERVENTIONS - Wound Cleansing / Measurement X - Simple  Wound Cleansing - one wound 1 5 '[]'$  - 0 Complex Wound Cleansing - multiple wounds X- 1 5 Wound Imaging (photographs - any number of wounds) '[]'$  - 0 Wound Tracing (instead of photographs) X- 1 5 Simple Wound Measurement - one wound '[]'$  - 0 Complex Wound Measurement - multiple wounds INTERVENTIONS - Wound Dressings '[]'$  - 0 Small Wound Dressing one or multiple wounds '[]'$  - 0 Medium Wound Dressing one or multiple wounds X- 1 20 Large Wound Dressing one or multiple wounds '[]'$  - 0 Application of Medications - topical '[]'$  - 0 Application of Medications - injection INTERVENTIONS - Miscellaneous '[]'$  - 0 External ear exam '[]'$  - 0 Specimen Collection (cultures, biopsies, blood, body fluids, etc.) '[]'$  - 0 Specimen(s) / Culture(s) sent or taken to Lab for analysis '[]'$  - 0 Patient Transfer (multiple staff / Civil Service fast streamer / Similar devices) '[]'$  - 0 Simple Staple / Suture removal (25 or less) '[]'$  - 0 Complex Staple / Suture removal (26 or more) '[]'$  - 0 Hypo / Hyperglycemic Management (close monitor of Blood Glucose) [name] [name], [name] [name] ([id number]) [id number] Page 3 of 8 '[]'$  - 0 Ankle / Brachial Index (ABI) - do not check if billed separately X- 1 5 Vital Signs Has the patient been seen at the hospital within the last three years: Yes Total Score: 100 Level Of Care: New/Established - Level 3 Electronic Signature(s) Signed: 04/12/2022 6:45:57 PM By: Deon Pilling RN, BSN Entered By: Deon Pilling on 04/12/2022 17:14:44 -------------------------------------------------------------------------------- Encounter Discharge Information Details Patient Name: Date of Service: [name] [name], A Hollie Salk E. 04/12/2022 2:45 PM Medical Record Number:  [id number] Patient Account Number: [id number] Date of Birth/Sex: Treating RN: [birth year] (61 y.o. [name] [name], [name] Primary Care AmeLie Hollars: Leanna Battles Other Clinician: Referring Chardonay [place]ch[place]: Treating Judyann Casasola/Extender: [name] [name] in Treatment: 7 Encounter Discharge Information Items Discharge Condition: Stable Ambulatory Status: Ambulatory Discharge Destination: Home Transportation: Private Auto Accompanied By: self Schedule Follow-up Appointment: Yes Clinical Summary of Care: Electronic Signature(s) Signed: 04/12/2022 6:45:57 PM By: Deon Pilling RN, BSN Entered By: Deon Pilling on 04/12/2022 17:16:27 -------------------------------------------------------------------------------- Lower Extremity Assessment Details Patient Name: Date of Service: [name] [name], [name] 04/12/2022 2:45 PM Medical Record Number: [id number] Patient Account Number: [id number] Date of Birth/Sex: Treating RN: [birth year] (61 y.o. F) Primary Care Giovanny Dugal: Leanna Battles Other Clinician: Referring Dondi Burandt: Treating Cire Deyarmin/Extender: [name] [name] in Treatment: 7 Edema Assessment Assessed: [Left: No] [Right: No] Edema: [Left: N] [Right: o] Calf Left: Right: Point of Measurement: From Medial Instep 30 cm Ankle Left: Right: Point of Measurement: From Medial Instep 19 cm Electronic Signature(s) Signed: 04/13/2022 4:06:07 PM By: Joline Salt ([id number]) By: Rayford Halsted.pdf Page 4 of 8 Signed: 04/13/2022 4:06:07 PM Entered By: Erenest Blank on 04/12/2022 15:22:00 -------------------------------------------------------------------------------- Multi Wound Chart Details Patient Name: Date of Service: [name] [name], [name] [name] 04/12/2022 2:45 PM Medical Record Number: [id number] Patient Account Number: [id number] Date of Birth/Sex: Treating RN: 08-08-1960 (61 y.o. F) Primary Care  Lovada Barwick: Leanna Battles Other Clinician: Referring Shatora Weatherbee: Treating Maeleigh Buschman/Extender: [name] [name] in Treatment: 7 Vital Signs Height(in): 62 Pulse(bpm): 83 Weight(lbs): 110 Blood Pressure(mmHg): 162/84 Body Mass Index(BMI): 20.1 Temperature(F): 98.4 Respiratory Rate(breaths/min): 18 [1:Photos:] [N/A:N/A] Right, Dorsal Foot N/A N/A Wound Location: Thermal Burn N/A N/A Wounding Event: 2nd degree Burn N/A N/A Primary Etiology: Hypertension N/A N/A Comorbid History: 02/02/2022 N/A N/A Date Acquired: 7 N/A N/A Weeks of Treatment: Open N/A N/A Wound Status:  No N/A N/A Wound Recurrence: Yes N/A N/A Clustered Wound: 2 N/A N/A Clustered Quantity: 2.9x1.4x0.1 N/A N/A Measurements L x W x D (cm) 3.189 N/A N/A A (cm) : rea 0.319 N/A N/A Volume (cm) : 93.20% N/A N/A % Reduction in A rea: 96.60% N/A N/A % Reduction in Volume: Full Thickness With Exposed Support N/A N/A Classification: Structures Medium N/A N/A Exudate Amount: Serosanguineous N/A N/A Exudate Type: red, brown N/A N/A Exudate Color: Distinct, outline attached N/A N/A Wound Margin: Large (67-100%) N/A N/A Granulation Amount: Red, Pink N/A N/A Granulation Quality: Small (1-33%) N/A N/A Necrotic Amount: Fat Layer (Subcutaneous Tissue): Yes N/A N/A Exposed Structures: Fascia: No Tendon: No Muscle: No Joint: No Bone: No None N/A N/A Epithelialization: Excoriation: No N/A N/A Periwound Skin Texture: Induration: No Callus: No Crepitus: No Rash: No Scarring: No Maceration: No N/A N/A Periwound Skin Moisture: Dry/Scaly: No Atrophie Blanche: No N/A N/A Periwound Skin Color: Cyanosis: No Ecchymosis: No Erythema: No [name] [name], [name] [name] ([id number]) [id number] Page 5 of 8 Hemosiderin Staining: No Mottled: No Pallor: No Rubor: No No Abnormality N/A N/A Temperature: Yes N/A N/A Tenderness on Palpation: Treatment  Notes Electronic Signature(s) Signed: 04/12/2022 4:00:28 PM By: Kalman Shan DO Entered By: Kalman Shan on 04/12/2022 15:57:41 -------------------------------------------------------------------------------- Multi-Disciplinary Care Plan Details Patient Name: Date of Service: [name] [name], A LLISO [name] Ginger E. 04/12/2022 2:45 PM Medical Record Number: [id number] Patient Account Number: [id number] Date of Birth/Sex: Treating RN: 10-Jul-1960 (61 y.o. [name] [name], [name] [name] Primary Care Deyvi Bonanno: Leanna Battles Other Clinician: Referring Ashani Pumphrey: Treating Joden Bonsall/Extender: [name] [name] in Treatment: 7 Active Inactive Wound/Skin Impairment Nursing Diagnoses: Impaired tissue integrity Knowledge deficit related to smoking impact on wound healing Knowledge deficit related to ulceration/compromised skin integrity Goals: Patient will demonstrate a reduced rate of smoking or cessation of smoking Date Initiated: 02/16/2022 Target Resolution Date: 05/06/2022 Goal Status: Active Patient will have a decrease in wound volume by X% from date: (specify in notes) Date Initiated: 02/16/2022 Target Resolution Date: 05/13/2022 Goal Status: Active Patient/caregiver will verbalize understanding of skin care regimen Date Initiated: 02/16/2022 Target Resolution Date: 05/05/2022 Goal Status: Active Interventions: Assess patient/caregiver ability to obtain necessary supplies Assess patient/caregiver ability to perform ulcer/skin care regimen upon admission and as needed Assess ulceration(s) every visit Provide education on smoking Notes: Electronic Signature(s) Signed: 04/12/2022 6:45:57 PM By: Deon Pilling RN, BSN Entered By: Deon Pilling on 04/12/2022 15:53:55 Pain Assessment Details -------------------------------------------------------------------------------- [name] [name] ([id number]) [id number] Page 6 of 8 Patient Name: Date of  Service: [name] [name] 04/12/2022 2:45 PM Medical Record Number: [id number] Patient Account Number: [id number] Date of Birth/Sex: Treating RN: [birth year] (61 y.o. F) Primary Care Ora Mcnatt: Leanna Battles Other Clinician: Referring Kaeleigh Westendorf: Treating Sander Speckman/Extender: [name] [name] in Treatment: 7 Active Problems Location of Pain Severity and Description of Pain Patient Has Paino No Site Locations Pain Management and Medication Current Pain Management: Electronic Signature(s) Signed: 04/13/2022 4:06:07 PM By: Erenest Blank Entered By: Erenest Blank on 04/12/2022 15:15:12 -------------------------------------------------------------------------------- Patient/Caregiver Education Details Patient Name: Date of Service: [name] [name], [name] [name] 11/27/2023andnbsp2:45 PM Medical Record Number: [id number] Patient Account Number: [id number] Date of Birth/Gender: Treating RN: [birth year] (61 y.o. [name] Primary Care Physician: Leanna Battles Other Clinician: Referring Physician: Treating Physician/Extender: [name] [name] in Treatment: 7 Education Assessment Education Provided To: Patient Education Topics Provided Wound/Skin Impairment: Handouts: Skin Care Do's and Dont's Methods: Explain/Verbal Responses: Reinforcements needed Electronic Signature(s) Signed: 04/12/2022 6:45:57 PM  By: Deon Pilling RN, BSN Entered By: Deon Pilling on 04/12/2022 15:54:08 [name] [name] ([id number]) [id number] Page 7 of 8 -------------------------------------------------------------------------------- Wound Assessment Details Patient Name: Date of Service: [name] [name], [name] [name] 04/12/2022 2:45 PM Medical Record Number: [id number] Patient Account Number: [id number] Date of Birth/Sex: Treating RN: Mar 08, 1961 (60 y.o. F) Primary Care Betina Puckett: Leanna Battles Other Clinician: Referring  Francene Mcerlean: Treating Berlinda Farve/Extender: [name] [name] in Treatment: 7 Wound Status Wound Number: 1 Primary Etiology: 2nd degree Burn Wound Location: Right, Dorsal Foot Wound Status: Open Wounding Event: Thermal Burn Comorbid History: Hypertension Date Acquired: 02/02/2022 Weeks Of Treatment: 7 Clustered Wound: Yes Photos Wound Measurements Length: (cm) Width: (cm) Depth: (cm) Clustered Quantity: Area: (cm) Volume: (cm) 2.9 % Reduction in Area: 93.2% 1.4 % Reduction in Volume: 96.6% 0.1 Epithelialization: None 2 Tunneling: No 3.189 Undermining: No 0.319 Wound Description Classification: Full Thickness With Exposed Suppo Wound Margin: Distinct, outline attached Exudate Amount: Medium Exudate Type: Serosanguineous Exudate Color: red, brown rt Structures Foul Odor After Cleansing: No Slough/Fibrino Yes Wound Bed Granulation Amount: Large (67-100%) Exposed Structure Granulation Quality: Red, Pink Fascia Exposed: No Necrotic Amount: Small (1-33%) Fat Layer (Subcutaneous Tissue) Exposed: Yes Necrotic Quality: Adherent Slough Tendon Exposed: No Muscle Exposed: No Joint Exposed: No Bone Exposed: No Periwound Skin Texture Texture Color No Abnormalities Noted: Yes No Abnormalities Noted: Yes Moisture Temperature / Pain No Abnormalities Noted: Yes Temperature: No Abnormality Tenderness on Palpation: Yes Treatment Notes Wound #1 (Foot) Wound Laterality: Dorsal, Right Cleanser [name] [name], [name] [name] ([id number]) [id number] Page 8 of 8 Soap and Water Discharge Instruction: May shower and wash wound with dial antibacterial soap and water prior to dressing change. Wound Cleanser Discharge Instruction: Cleanse the wound with wound cleanser prior to applying a clean dressing using gauze sponges, not tissue or cotton balls. Peri-Wound Care Topical Gentamicin Discharge Instruction: As directed by physician Mupirocin  Ointment Discharge Instruction: Apply Mupirocin (Bactroban) as instructed Primary Dressing Hydrofera Blue Ready Foam, 2.5 x2.5 in Discharge Instruction: Apply over the [place]. Secondary Dressing ABD Pad, 5x9 Discharge Instruction: Apply over primary dressing as directed. Secured With Compression Wrap Kerlix Roll 4.5x3.1 (in/yd) Discharge Instruction: Apply Kerlix and Coban compression as directed. Coban Self-Adherent Wrap 4x5 (in/yd) Discharge Instruction: Apply over Kerlix as directed. Compression Stockings Add-Ons Electronic Signature(s) Signed: 04/13/2022 4:06:07 PM By: Erenest Blank Entered By: Erenest Blank on 04/12/2022 15:21:24 -------------------------------------------------------------------------------- Vitals Details Patient Name: Date of Service: [name] [name], A LLISO [name] Ginger E. 04/12/2022 2:45 PM Medical Record Number: [id number] Patient Account Number: [id number] Date of Birth/Sex: Treating RN: [birth year] (61 y.o. F) Primary Care Sebrina Kessner: Leanna Battles Other Clinician: Referring Fiore Detjen: Treating Hennie Gosa/Extender: [name] [name] in Treatment: 7 Vital Signs Time Taken: 15:09 Temperature (F): 98.4 Height (in): 62 Pulse (bpm): 83 Weight (lbs): 110 Respiratory Rate (breaths/min): 18 Body Mass Index (BMI): 20.1 Blood Pressure (mmHg): 162/84 Reference Range: 80 - 120 mg / dl Electronic Signature(s) Signed: 04/13/2022 4:06:07 PM By: Erenest Blank Entered By: Erenest Blank on 04/12/2022 15:14:39

## 2022-04-19 ENCOUNTER — Encounter (HOSPITAL_BASED_OUTPATIENT_CLINIC_OR_DEPARTMENT_OTHER): Payer: 59 | Attending: Internal Medicine | Admitting: Internal Medicine

## 2022-04-19 DIAGNOSIS — L03115 Cellulitis of right lower limb: Secondary | ICD-10-CM | POA: Insufficient documentation

## 2022-04-19 DIAGNOSIS — T24231A Burn of second degree of right lower leg, initial encounter: Secondary | ICD-10-CM

## 2022-04-19 DIAGNOSIS — L97518 Non-pressure chronic ulcer of other part of right foot with other specified severity: Secondary | ICD-10-CM | POA: Diagnosis not present

## 2022-04-19 DIAGNOSIS — E039 Hypothyroidism, unspecified: Secondary | ICD-10-CM | POA: Diagnosis not present

## 2022-04-19 NOTE — Progress Notes (Signed)
[name] [name] ([id number]) [id number] Page 1 of 7 Visit Report for 04/19/2022 Chief Complaint Document Details Patient Name: Date of Service: [name] [name] 04/19/2022 2:30 PM Medical Record Number: [id number] Patient Account Number: [id number] Date of Birth/Sex: Treating RN: 02-25-1961 (61 y.o. F) Primary Care Provider: Leanna [name] Other Clinician: Referring Provider: Treating Provider/Extender: [name] [name] in Treatment: 8 Information Obtained from: Patient Chief Complaint 02/16/2022; burn to the right foot Electronic Signature(s) Signed: 04/19/2022 3:20:41 PM By: [name] Shan DO Entered By: [name] [name] on 04/19/2022 15:17:34 -------------------------------------------------------------------------------- HPI Details Patient Name: Date of Service: [name] [name], [name] LLISO [name] Ginger E. 04/19/2022 2:30 PM Medical Record Number: [id number] Patient Account Number: [id number] Date of Birth/Sex: Treating RN: 10-Feb-1961 (61 y.o. F) Primary Care Provider: Leanna [name] Other Clinician: Referring Provider: Treating Provider/Extender: [name] [name] in Treatment: 8 History of Present Illness HPI Description: Admission 02/16/2022 [name] is [name] 61 year old female with [name] past medical history of hypertension and hypothyroidism that presents to the clinic for 2-week history of burn to her right foot. She states she was cooking mashed potatoes and they fell on her foot resulting in [name] burn. She states she visited her primary care physician and was started on Silvadene. She had [name] follow-up visit and was prescribed doxycycline and mupirocin. She denies signs of infection currently. She reports minimal improvement in wound healing. 10/10; patient presents for follow-up. She states she has been using Santyl to the wound bed daily. She states she has used the entire tube over the past week. She has  increased warmth and erythema to the right foot. She states she completed doxycycline last week. 10/17; patient presents for follow-up. She just received Iodosorb in the mail and started this yesterday. She states that she squeezed out the last little bit of Santyl from the tube and has been using this to the wound bed. She is currently taking the clindamycin as prescribed at last clinic visit. She reports improvement in her symptoms. She has been using Medihoney to the toe wound. She has no issues or complaints today. 10/31; patient presents for follow-up. She states she has been using Iodosorb to the wound bed and bacitracin to the toe wound. She has no issues or complaints today. 11/14; patient presents for follow-up. She has been using Iodosorb to the wound bed. The toe wound has healed. She has no issues or complaints today. 11/21; patient presents for follow-up. She has been using Medihoney and Hydrofera Blue to the wound bed. She has no issues or complaints today. 11/27; Patient presents for follow up. She has been using medihoney and hydrofera blue to the wound bed. She had [name] hard time getting the Tubigrip on and has not been using this. 12/4; patient presents for follow-up. We use antibiotic ointment with Hydrofera Blue under Kerlix/Coban at last clinic visit. She tolerated the compression wrap well. She has no issues or complaints today. Electronic Signature(s) Signed: 04/19/2022 3:20:41 PM By: [name] [name] ([id number]) [id number] Page 2 of 7 Entered By: [name] [name] on 04/19/2022 15:18:28 -------------------------------------------------------------------------------- Physical Exam Details Patient Name: Date of Service: [name] [name], [name] 04/19/2022 2:30 PM Medical Record Number: [id number] Patient Account Number: [id number] Date of Birth/Sex: Treating RN: 11-Sep-1960 (61 y.o. F) Primary Care Provider: Leanna [name]  Other Clinician: Referring Provider: Treating Provider/Extender: [name] [name] in Treatment: 8 Constitutional respirations regular, non-labored and within target range for patient.. Cardiovascular  2+ dorsalis pedis/posterior tibialis pulses. Psychiatric pleasant and cooperative. Notes Right foot: T the dorsal aspect there is an open wound with granulation tissue And fibrinous tissue. No signs of surrounding soft tissue infection. o Electronic Signature(s) Signed: 04/19/2022 3:20:41 PM By: [name] Shan DO Entered By: [name] [name] on 04/19/2022 15:19:06 -------------------------------------------------------------------------------- Physician Orders Details Patient Name: Date of Service: [name] [name], [name] LLISO [name] Ginger E. 04/19/2022 2:30 PM Medical Record Number: [id number] Patient Account Number: [id number] Date of Birth/Sex: Treating RN: 1961/03/17 (61 y.o. [name] [name] Primary Care Provider: Leanna [name] Other Clinician: Referring Provider: Treating Provider/Extender: [name] [name] in Treatment: 8 Verbal / Phone Orders: No Diagnosis Coding Follow-up Appointments ppointment in 1 week. - Dr. Heber [name] Return [name] ppointment in 2 weeks. - Dr. Heber  Return [name] Anesthetic (In clinic) Topical Lidocaine 5% applied to wound bed Bathing/ Shower/ Hygiene May shower with protection but do not get wound dressing(s) wet. Edema Control - Lymphedema / SCD / Other Elevate legs to the level of the heart or above for 30 minutes daily and/or when sitting, [name] frequency of: Avoid standing for long periods of time. Wound Treatment Wound #1 - Foot Wound Laterality: Dorsal, Right Cleanser: Soap and Water 1 x Per Week/15 Days Discharge Instructions: May shower and wash wound with dial antibacterial soap and water prior to dressing change. Cleanser: Wound Cleanser (Generic) 1 x Per Week/15 Days [name] [name] ([id number])  [phone].pdf Page 3 of 7 Discharge Instructions: Cleanse the wound with wound cleanser prior to applying [name] clean dressing using gauze sponges, not tissue or cotton balls. Topical: Gentamicin 1 x Per Week/15 Days Discharge Instructions: As directed by physician Topical: Mupirocin Ointment 1 x Per Week/15 Days Discharge Instructions: Apply Mupirocin (Bactroban) as instructed Prim Dressing: Hydrofera Blue Ready Foam, 2.5 x2.5 in 1 x Per Week/15 Days ary Discharge Instructions: Apply over the [place]. Secondary Dressing: ABD Pad, 5x9 1 x Per Week/15 Days Discharge Instructions: Apply over primary dressing as directed. Compression Wrap: Kerlix Roll 4.5x3.1 (in/yd) 1 x Per Week/15 Days Discharge Instructions: Apply Kerlix and Coban compression as directed. Compression Wrap: Coban Self-Adherent Wrap 4x5 (in/yd) 1 x Per Week/15 Days Discharge Instructions: Apply over Kerlix as directed. Electronic Signature(s) Signed: 04/19/2022 3:20:41 PM By: [name] Shan DO Entered By: [name] [name] on 04/19/2022 15:19:13 -------------------------------------------------------------------------------- Problem List Details Patient Name: Date of Service: [name] [name], [name] LLISO [name] Ginger E. 04/19/2022 2:30 PM Medical Record Number: [id number] Patient Account Number: [id number] Date of Birth/Sex: Treating RN: January 10, 1961 (61 y.o. F) Primary Care Provider: Leanna [name] Other Clinician: Referring Provider: Treating Provider/Extender: [name] [name] in Treatment: 8 Active Problems ICD-10 Encounter Code Description Active Date MDM Diagnosis T24.231A Burn of second degree of right lower leg, initial encounter 02/16/2022 No Yes E03.9 Hypothyroidism, unspecified 02/16/2022 No Yes L03.115 Cellulitis of right lower limb 02/23/2022 No Yes L97.518 Non-pressure chronic ulcer of other part of right foot with other specified 03/30/2022 No Yes severity Inactive  Problems Resolved Problems Electronic Signature(s) Signed: 04/19/2022 3:20:41 PM By: [name] Shan DO Entered By: [name] [name] on 04/19/2022 15:17:12 Burgess Amor ([id number]) [id number] Page 4 of 7 -------------------------------------------------------------------------------- Progress Note Details Patient Name: Date of Service: [name] [name], [name] Ehlers E. 04/19/2022 2:30 PM Medical Record Number: [id number] Patient Account Number: [id number] Date of Birth/Sex: Treating RN: 1960-09-08 (61 y.o. F) Primary Care Provider: Leanna [name] Other Clinician: Referring Provider: Treating Provider/Extender: [name] [name] in Treatment: 8 Subjective Chief Complaint Information obtained from Patient 02/16/2022;  burn to the right foot History of Present Illness (HPI) Admission 02/16/2022 Ms. Corina Stacy is [name] 60 year old female with [name] past medical history of hypertension and hypothyroidism that presents to the clinic for 2-week history of burn to her right foot. She states she was cooking mashed potatoes and they fell on her foot resulting in [name] burn. She states she visited her primary care physician and was started on Silvadene. She had [name] follow-up visit and was prescribed doxycycline and mupirocin. She denies signs of infection currently. She reports minimal improvement in wound healing. 10/10; patient presents for follow-up. She states she has been using Santyl to the wound bed daily. She states she has used the entire tube over the past week. She has increased warmth and erythema to the right foot. She states she completed doxycycline last week. 10/17; patient presents for follow-up. She just received Iodosorb in the mail and started this yesterday. She states that she squeezed out the last little bit of Santyl from the tube and has been using this to the wound bed. She is currently taking the clindamycin as prescribed at last  clinic visit. She reports improvement in her symptoms. She has been using Medihoney to the toe wound. She has no issues or complaints today. 10/31; patient presents for follow-up. She states she has been using Iodosorb to the wound bed and bacitracin to the toe wound. She has no issues or complaints today. 11/14; patient presents for follow-up. She has been using Iodosorb to the wound bed. The toe wound has healed. She has no issues or complaints today. 11/21; patient presents for follow-up. She has been using Medihoney and Hydrofera Blue to the wound bed. She has no issues or complaints today. 11/27; Patient presents for follow up. She has been using medihoney and hydrofera blue to the wound bed. She had [name] hard time getting the Tubigrip on and has not been using this. 12/4; patient presents for follow-up. We use antibiotic ointment with Hydrofera Blue under Kerlix/Coban at last clinic visit. She tolerated the compression wrap well. She has no issues or complaints today. Patient History Information obtained from Patient, Chart. Family History Unknown History. Social History Current every day smoker, Marital Status - Married, Alcohol Use - Moderate, Drug Use - No History, Caffeine Use - Moderate. Medical History Cardiovascular Patient has history of Hypertension Medical [name] Surgical History Notes nd Gastrointestinal GERD Endocrine thyroid disease Objective Constitutional respirations regular, non-labored and within target range for patient.. Vitals Time Taken: 2:50 PM, Height: 62 in, Weight: 110 lbs, BMI: 20.1, Temperature: 98.3 F, Pulse: 78 bpm, Respiratory Rate: 20 breaths/min, Blood Pressure: [name] [name], [name] [name] ([id number]) [id number] Page 5 of 7 137/80 mmHg. Cardiovascular 2+ dorsalis pedis/posterior tibialis pulses. Psychiatric pleasant and cooperative. General Notes: Right foot: T the dorsal aspect there is an open wound with granulation tissue And  fibrinous tissue. No signs of surrounding soft tissue o infection. Integumentary (Hair, Skin) Wound #1 status is Open. Original cause of wound was Thermal Burn. The date acquired was: 02/02/2022. The wound has been in treatment 8 weeks. The wound is located on the Right,Dorsal Foot. The wound measures 2cm length x 0.8cm width x 0.1cm depth; 1.257cm^2 area and 0.126cm^3 volume. There is Fat Layer (Subcutaneous Tissue) exposed. There is no tunneling or undermining noted. There is [name] medium amount of serosanguineous drainage noted. The wound margin is distinct with the outline attached to the wound base. There is large (67-100%) red, pink granulation within the wound bed. There is [name]  small (1-33%) amount of necrotic tissue within the wound bed including Adherent Slough. The periwound skin appearance had no abnormalities noted for texture. The periwound skin appearance had no abnormalities noted for moisture. The periwound skin appearance had no abnormalities noted for color. Periwound temperature was noted as No Abnormality. The periwound has tenderness on palpation. Assessment Active Problems ICD-10 Burn of second degree of right lower leg, initial encounter Hypothyroidism, unspecified Cellulitis of right lower limb Non-pressure chronic ulcer of other part of right foot with other specified severity Patient's wound has shown improvement in size in appearance since last clinic visit. I recommended continuing course with antibiotic ointment and Hydrofera Blue under compression wrap. Follow-up in 1 week. Plan Follow-up Appointments: Return Appointment in 1 week. - Dr. Heber [place] Return Appointment in 2 weeks. - Dr. Heber  Anesthetic: (In clinic) Topical Lidocaine 5% applied to wound bed Bathing/ Shower/ Hygiene: May shower with protection but do not get wound dressing(s) wet. Edema Control - Lymphedema / SCD / Other: Elevate legs to the level of the heart or above for 30 minutes daily and/or when  sitting, [name] frequency of: Avoid standing for long periods of time. WOUND #1: - Foot Wound Laterality: Dorsal, Right Cleanser: Soap and Water 1 x Per Week/15 Days Discharge Instructions: May shower and wash wound with dial antibacterial soap and water prior to dressing change. Cleanser: Wound Cleanser (Generic) 1 x Per Week/15 Days Discharge Instructions: Cleanse the wound with wound cleanser prior to applying [name] clean dressing using gauze sponges, not tissue or cotton balls. Topical: Gentamicin 1 x Per Week/15 Days Discharge Instructions: As directed by physician Topical: Mupirocin Ointment 1 x Per Week/15 Days Discharge Instructions: Apply Mupirocin (Bactroban) as instructed Prim Dressing: Hydrofera Blue Ready Foam, 2.5 x2.5 in 1 x Per Week/15 Days ary Discharge Instructions: Apply over the [place]. Secondary Dressing: ABD Pad, 5x9 1 x Per Week/15 Days Discharge Instructions: Apply over primary dressing as directed. Com pression Wrap: Kerlix Roll 4.5x3.1 (in/yd) 1 x Per Week/15 Days Discharge Instructions: Apply Kerlix and Coban compression as directed. Com pression Wrap: Coban Self-Adherent Wrap 4x5 (in/yd) 1 x Per Week/15 Days Discharge Instructions: Apply over Kerlix as directed. 1. Hydrofera Blue and antibiotic ointment under Kerlix/Coban 2. Follow-up in 1 week Electronic Signature(s) Signed: 04/19/2022 3:20:41 PM By: [name] Shan DO Entered By: [name] [name] on 04/19/2022 15:19:58 Burgess Amor ([id number]) [id number] Page 6 of 7 -------------------------------------------------------------------------------- HxROS Details Patient Name: Date of Service: [name] [name], [name] Ehlers E. 04/19/2022 2:30 PM Medical Record Number: [id number] Patient Account Number: [id number] Date of Birth/Sex: Treating RN: 1960/10/02 (61 y.o. F) Primary Care Provider: Leanna [name] Other Clinician: Referring Provider: Treating Provider/Extender: [name] [name] in Treatment: 8 Information Obtained From Patient Chart Cardiovascular Medical History: Positive for: Hypertension Gastrointestinal Medical History: Past Medical History Notes: GERD Endocrine Medical History: Past Medical History Notes: thyroid disease Immunizations Pneumococcal Vaccine: Received Pneumococcal Vaccination: No Implantable Devices None Family and Social History Unknown History: Yes; Current every day smoker; Marital Status - Married; Alcohol Use: Moderate; Drug Use: No History; Caffeine Use: Moderate; Financial Concerns: No; Food, Clothing or Shelter Needs: No; Support System Lacking: No; Transportation Concerns: No Electronic Signature(s) Signed: 04/19/2022 3:20:41 PM By: [name] Shan DO Entered By: [name] [name] on 04/19/2022 15:18:33 -------------------------------------------------------------------------------- SuperBill Details Patient Name: Date of Service: [name] [name], [name] LLISO [name] Ginger E. 04/19/2022 Medical Record Number: [id number] Patient Account Number: [id number] Date of Birth/Sex: Treating RN: February 19, 1961 (61 y.o. F) Primary Care Provider: Leanna [name]  Other Clinician: Referring Provider: Treating Provider/Extender: [name] [name] in Treatment: 8 Diagnosis Coding ICD-10 Codes Code Description G25.427C Burn of second degree of right lower leg, initial encounter E03.9 Hypothyroidism, unspecified [place], [place] ([id number]) [phone].pdf Page 7 of 7 L03.115 Cellulitis of right lower limb L97.518 Non-pressure chronic ulcer of other part of right foot with other specified severity Facility Procedures : CPT4 Code: [id number] Description: 99213 - WOUND CARE VISIT-LEV 3 EST PT Modifier: Quantity: 1 Physician Procedures : CPT4 Code Description Modifier [id number] 99213 - WC PHYS LEVEL 3 - EST PT ICD-10 Diagnosis Description B01.751W Burn of second degree of right  lower leg, initial encounter L97.518 Non-pressure chronic ulcer of other part of right foot with other  specified severity E03.9 Hypothyroidism, unspecified L03.115 Cellulitis of right lower limb Quantity: 1 Electronic Signature(s) Signed: 04/19/2022 3:46:46 PM By: Rhae Hammock RN Signed: 04/20/2022 12:20:03 PM By: [name] Shan DO Previous Signature: 04/19/2022 3:20:41 PM Version By: [name] Shan DO Entered By: Rhae Hammock on 04/19/2022 15:23:19

## 2022-04-19 NOTE — Progress Notes (Signed)
TAMORAH, HADA ([id number]) [id number] Page 1 of 9 Visit Report for 04/19/2022 Arrival Information Details Patient Name: Date of Service: [name] [name] 04/19/2022 2:30 PM Medical Record Number: [id number] Patient Account Number: [id number] Date of Birth/Sex: Treating RN: [birth year] (61 y.o. [name], Meta.Reding Primary Care Giovan Pinsky: Leanna Battles Other Clinician: Referring Laramie Meissner: Treating Earnestine Tuohey/Extender: Darrold Junker in Treatment: 8 Visit Information History Since Last Visit Added or deleted any medications: No Patient Arrived: Ambulatory Any new allergies or adverse reactions: No Arrival Time: 14:49 Had a fall or experienced change in No Accompanied By: self activities of daily living that may affect Transfer Assistance: None risk of falls: Patient Identification Verified: Yes Signs or symptoms of abuse/neglect since last visito No Secondary Verification Process Completed: Yes Hospitalized since last visit: No Patient Requires Transmission-Based Precautions: No Implantable device outside of the clinic excluding No Patient Has Alerts: No cellular tissue based products placed in the center since last visit: Has Dressing in Place as Prescribed: Yes Has Compression in Place as Prescribed: Yes Pain Present Now: Yes Electronic Signature(s) Signed: 04/19/2022 4:59:17 PM By: Deon Pilling RN, BSN Entered By: Deon Pilling on 04/19/2022 14:49:53 -------------------------------------------------------------------------------- Clinic Level of Care Assessment Details Patient Name: Date of Service: [name], [name] 04/19/2022 2:30 PM Medical Record Number: [id number] Patient Account Number: [id number] Date of Birth/Sex: Treating RN: 17-May-1961 (61 y.o. [name], [name] Primary Care Rambo Sarafian: Leanna Battles Other Clinician: Referring Rheagan Nayak: Treating Tavita Eastham/Extender: Darrold Junker in Treatment: 8 Clinic Level of Care Assessment Items TOOL 4 Quantity Score X- 1 0 Use when only an EandM is performed on FOLLOW-UP visit ASSESSMENTS - Nursing Assessment / Reassessment X- 1 10 Reassessment of Co-morbidities (includes updates in patient status) X- 1 5 Reassessment of Adherence to Treatment Plan ASSESSMENTS - Wound and Skin A ssessment / Reassessment X - Simple Wound Assessment / Reassessment - one wound 1 5 '[]'$  - 0 Complex Wound Assessment / Reassessment - multiple wounds '[]'$  - 0 Dermatologic / Skin Assessment (not related to wound area) ASSESSMENTS - Focused Assessment '[]'$  - 0 Circumferential Edema Measurements - multi extremities '[]'$  - 0 Nutritional Assessment / Counseling / Intervention WANZA, SZUMSKI ([id number]) [id number] Page 2 of 9 '[]'$  - 0 Lower Extremity Assessment (monofilament, tuning fork, pulses) '[]'$  - 0 Peripheral Arterial Disease Assessment (using hand held doppler) ASSESSMENTS - Ostomy and/or Continence Assessment and Care '[]'$  - 0 Incontinence Assessment and Management '[]'$  - 0 Ostomy Care Assessment and Management (repouching, etc.) PROCESS - Coordination of Care X - Simple Patient / Family Education for ongoing care 1 15 '[]'$  - 0 Complex (extensive) Patient / Family Education for ongoing care X- 1 10 Staff obtains Programmer, systems, Records, T Results / Process Orders est '[]'$  - 0 Staff telephones HHA, Nursing Homes / Clarify orders / etc '[]'$  - 0 Routine Transfer to another Facility (non-emergent condition) '[]'$  - 0 Routine Hospital Admission (non-emergent condition) '[]'$  - 0 New Admissions / Biomedical engineer / Ordering NPWT Apligraf, etc. , '[]'$  - 0 Emergency Hospital Admission (emergent condition) X- 1 10 Simple Discharge Coordination '[]'$  - 0 Complex (extensive) Discharge Coordination PROCESS - Special Needs '[]'$  - 0 Pediatric / Minor Patient Management '[]'$  - 0 Isolation Patient Management '[]'$  -  0 Hearing / Language / Visual special needs '[]'$  - 0 Assessment of Community assistance (transportation, D/C planning, etc.) '[]'$  - 0 Additional assistance / Altered mentation '[]'$  - 0 Support Surface(s) Assessment (bed, cushion,  seat, etc.) INTERVENTIONS - Wound Cleansing / Measurement X - Simple Wound Cleansing - one wound 1 5 '[]'$  - 0 Complex Wound Cleansing - multiple wounds X- 1 5 Wound Imaging (photographs - any number of wounds) '[]'$  - 0 Wound Tracing (instead of photographs) X- 1 5 Simple Wound Measurement - one wound '[]'$  - 0 Complex Wound Measurement - multiple wounds INTERVENTIONS - Wound Dressings X - Small Wound Dressing one or multiple wounds 1 10 '[]'$  - 0 Medium Wound Dressing one or multiple wounds '[]'$  - 0 Large Wound Dressing one or multiple wounds X- 1 5 Application of Medications - topical '[]'$  - 0 Application of Medications - injection INTERVENTIONS - Miscellaneous '[]'$  - 0 External ear exam '[]'$  - 0 Specimen Collection (cultures, biopsies, blood, body fluids, etc.) '[]'$  - 0 Specimen(s) / Culture(s) sent or taken to Lab for analysis '[]'$  - 0 Patient Transfer (multiple staff / Civil Service fast streamer / Similar devices) '[]'$  - 0 Simple Staple / Suture removal (25 or less) '[]'$  - 0 Complex Staple / Suture removal (26 or more) '[]'$  - 0 Hypo / Hyperglycemic Management (close monitor of Blood Glucose) PRISCILLA, KIRSTEIN ([id number]) [phone].pdf Page 3 of 9 '[]'$  - 0 Ankle / Brachial Index (ABI) - do not check if billed separately X- 1 5 Vital Signs Has the patient been seen at the hospital within the last three years: Yes Total Score: 90 Level Of Care: New/Established - Level 3 Electronic Signature(s) Signed: 04/19/2022 3:46:46 PM By: Rhae Hammock RN Entered By: Rhae Hammock on 04/19/2022 15:23:12 -------------------------------------------------------------------------------- Encounter Discharge Information Details Patient Name: Date of Service: [name] [name]  CK, A LLISO Delane Ginger E. 04/19/2022 2:30 PM Medical Record Number: [id number] Patient Account Number: [id number] Date of Birth/Sex: Treating RN: 10-24-1960 (61 y.o. [name], [name] Primary Care Ressie Slevin: Leanna Battles Other Clinician: Referring Seynabou Fults: Treating Ayari Liwanag/Extender: Darrold Junker in Treatment: 8 Encounter Discharge Information Items Discharge Condition: Stable Ambulatory Status: Ambulatory Discharge Destination: Home Transportation: Private Auto Accompanied By: self Schedule Follow-up Appointment: Yes Clinical Summary of Care: Patient Declined Electronic Signature(s) Signed: 04/19/2022 3:46:46 PM By: Rhae Hammock RN Entered By: Rhae Hammock on 04/19/2022 15:23:52 -------------------------------------------------------------------------------- Lower Extremity Assessment Details Patient Name: Date of Service: [name] [name] CK, [name] 04/19/2022 2:30 PM Medical Record Number: [id number] Patient Account Number: [id number] Date of Birth/Sex: Treating RN: 1960-07-20 (61 y.o. [name], [name] Primary Care Sejla Marzano: Leanna Battles Other Clinician: Referring Jennalee Greaves: Treating Arben Packman/Extender: Darrold Junker in Treatment: 8 Edema Assessment Assessed: [Left: No] [Right: Yes] Edema: [Left: N] [Right: o] Calf Left: Right: Point of Measurement: From Medial Instep 28 cm Ankle Left: Right: Point of Measurement: From Medial Instep 17.5 cm Vascular Assessment Left: [[id number] Page 4 of 9Right:] Pulses: Dorsalis Pedis Palpable: [[id number] Page 4 of 9Yes] Electronic Signature(s) Signed: 04/19/2022 4:59:17 PM By: Deon Pilling RN, BSN Entered By: Deon Pilling on 04/19/2022 14:54:36 -------------------------------------------------------------------------------- Multi Wound Chart Details Patient Name: Date of Service: [name] [name] CK, A LLISO Delane Ginger E. 04/19/2022 2:30  PM Medical Record Number: [id number] Patient Account Number: [id number] Date of Birth/Sex: Treating RN: 12-Nov-1960 (61 y.o. F) Primary Care Jaiona Simien: Leanna Battles Other Clinician: Referring Larrisa Cravey: Treating Tyeisha Dinan/Extender: Darrold Junker in Treatment: 8 Vital Signs Height(in): 62 Pulse(bpm): 44 Weight(lbs): 110 Blood Pressure(mmHg): 137/80 Body Mass Index(BMI): 20.1 Temperature(F): 98.3 Respiratory Rate(breaths/min): 20 [1:Photos:] [N/A:N/A] Right, Dorsal Foot N/A N/A Wound Location: Thermal Burn N/A N/A Wounding Event: 2nd degree Burn N/A N/A Primary Etiology: Hypertension N/A N/A Comorbid  History: 02/02/2022 N/A N/A Date Acquired: 8 N/A N/A Weeks of Treatment: Open N/A N/A Wound Status: No N/A N/A Wound Recurrence: Yes N/A N/A Clustered Wound: 1 N/A N/A Clustered Quantity: 2x0.8x0.1 N/A N/A Measurements L x W x D (cm) 1.257 N/A N/A A (cm) : rea 0.126 N/A N/A Volume (cm) : 97.30% N/A N/A % Reduction in A rea: 98.70% N/A N/A % Reduction in Volume: Full Thickness With Exposed Support N/A N/A Classification: Structures Medium N/A N/A Exudate Amount: Serosanguineous N/A N/A Exudate Type: red, brown N/A N/A Exudate Color: Distinct, outline attached N/A N/A Wound Margin: Large (67-100%) N/A N/A Granulation Amount: Red, Pink N/A N/A Granulation Quality: Small (1-33%) N/A N/A Necrotic Amount: Fat Layer (Subcutaneous Tissue): Yes N/A N/A Exposed Structures: Fascia: No Tendon: No Muscle: No Joint: No Bone: No Large (67-100%) N/A N/A Epithelialization: Excoriation: No N/A N/A Periwound Skin Texture: Induration: No Callus: No SHIRRELL, SOLINGER ([id number]) [phone].pdf Page 5 of 9 Crepitus: No Rash: No Scarring: No Maceration: No N/A N/A Periwound Skin Moisture: Dry/Scaly: No Atrophie Blanche: No N/A N/A Periwound Skin Color: Cyanosis: No Ecchymosis: No Erythema: No Hemosiderin  Staining: No Mottled: No Pallor: No Rubor: No No Abnormality N/A N/A Temperature: Yes N/A N/A Tenderness on Palpation: Treatment Notes Electronic Signature(s) Signed: 04/19/2022 3:20:41 PM By: Kalman Shan DO Entered By: Kalman Shan on 04/19/2022 15:17:17 -------------------------------------------------------------------------------- Multi-Disciplinary Care Plan Details Patient Name: Date of Service: [name] [name] CK, [name] 04/19/2022 2:30 PM Medical Record Number: [id number] Patient Account Number: [id number] Date of Birth/Sex: Treating RN: 1960-08-17 (61 y.o. [name], [name] Primary Care Maili Shutters: Leanna Battles Other Clinician: Referring Dennie Vecchio: Treating Timberly Yott/Extender: Darrold Junker in Treatment: 8 Active Inactive Wound/Skin Impairment Nursing Diagnoses: Impaired tissue integrity Knowledge deficit related to smoking impact on wound healing Knowledge deficit related to ulceration/compromised skin integrity Goals: Patient will demonstrate a reduced rate of smoking or cessation of smoking Date Initiated: 02/16/2022 Target Resolution Date: 05/06/2022 Goal Status: Active Patient will have a decrease in wound volume by X% from date: (specify in notes) Date Initiated: 02/16/2022 Target Resolution Date: 05/13/2022 Goal Status: Active Patient/caregiver will verbalize understanding of skin care regimen Date Initiated: 02/16/2022 Target Resolution Date: 05/05/2022 Goal Status: Active Interventions: Assess patient/caregiver ability to obtain necessary supplies Assess patient/caregiver ability to perform ulcer/skin care regimen upon admission and as needed Assess ulceration(s) every visit Provide education on smoking Notes: Electronic Signature(s) Signed: 04/19/2022 3:46:46 PM By: Rhae Hammock RN Entered By: Rhae Hammock on 04/19/2022 15:15:31 Burgess Amor ([id number]) [id number] Page 6 of  9 -------------------------------------------------------------------------------- Pain Assessment Details Patient Name: Date of Service: [name] [name] CK, [name] 04/19/2022 2:30 PM Medical Record Number: [id number] Patient Account Number: [id number] Date of Birth/Sex: Treating RN: 1960/10/15 (61 y.o. [name] Primary Care Edgardo Petrenko: Leanna Battles Other Clinician: Referring Lesean Woolverton: Treating Derick Seminara/Extender: Darrold Junker in Treatment: 8 Active Problems Location of Pain Severity and Description of Pain Patient Has Paino Yes Site Locations Pain Location: Pain in Ulcers Rate the pain. Current Pain Level: 3 Character of Pain Describe the Pain: Burning Pain Management and Medication Current Pain Management: Medication: No Cold Application: No Rest: No Massage: No Activity: No T.E.N.S.: No Heat Application: No Leg drop or elevation: No Is the Current Pain Management Adequate: Adequate How does your wound impact your activities of daily livingo Sleep: No Bathing: No Appetite: No Relationship With Others: No Bladder Continence: No Emotions: No Bowel Continence: No Work: No Toileting: No Drive: No  Dressing: No Hobbies: No Electronic Signature(s) Signed: 04/19/2022 4:59:17 PM By: Deon Pilling RN, BSN Entered By: Deon Pilling on 04/19/2022 14:50:06 -------------------------------------------------------------------------------- Patient/Caregiver Education Details Patient Name: Date of Service: [name] [name] CK, [name] 12/4/2023andnbsp2:30 PM Medical Record Number: [id number] Patient Account Number: [id number] Date of Birth/Gender: Treating RN: May 11, 1961 (61 y.o. Laiana, Fratus, [name] ([id number]) [id number] Page 7 of 9 Primary Care Physician: Leanna Battles Other Clinician: Referring Physician: Treating Physician/Extender: Darrold Junker in Treatment:  8 Education Assessment Education Provided To: Patient Education Topics Provided Smoking and Wound Healing: Methods: Explain/Verbal Responses: Reinforcements needed, State content correctly Electronic Signature(s) Signed: 04/19/2022 3:46:46 PM By: Rhae Hammock RN Entered By: Rhae Hammock on 04/19/2022 15:15:56 -------------------------------------------------------------------------------- Wound Assessment Details Patient Name: Date of Service: [name] [name] CK, [name] 04/19/2022 2:30 PM Medical Record Number: [id number] Patient Account Number: [id number] Date of Birth/Sex: Treating RN: [birth year] (61 y.o. [name] Primary Care Zoltan Genest: Leanna Battles Other Clinician: Referring Leilan Bochenek: Treating Iva Montelongo/Extender: Darrold Junker in Treatment: 8 Wound Status Wound Number: 1 Primary Etiology: 2nd degree Burn Wound Location: Right, Dorsal Foot Wound Status: Open Wounding Event: Thermal Burn Comorbid History: Hypertension Date Acquired: 02/02/2022 Weeks Of Treatment: 8 Clustered Wound: Yes Photos Wound Measurements Length: (cm) Width: (cm) Depth: (cm) Clustered Quantity: Area: (cm) Volume: (cm) 2 % Reduction in Area: 97.3% 0.8 % Reduction in Volume: 98.7% 0.1 Epithelialization: Large (67-100%) 1 Tunneling: No 1.257 Undermining: No 0.126 Wound Description Classification: Full Thickness With Exposed Support Structures Wound Margin: Distinct, outline attached Exudate Amount: Medium Exudate Type: Serosanguineous Exudate Color: red, brown MALEYA, LEEVER ([id number]) Foul Odor After Cleansing: No Slough/Fibrino Yes [phone].pdf Page 8 of 9 Wound Bed Granulation Amount: Large (67-100%) Exposed Structure Granulation Quality: Red, Pink Fascia Exposed: No Necrotic Amount: Small (1-33%) Fat Layer (Subcutaneous Tissue) Exposed: Yes Necrotic Quality: Adherent Slough Tendon Exposed: No Muscle  Exposed: No Joint Exposed: No Bone Exposed: No Periwound Skin Texture Texture Color No Abnormalities Noted: Yes No Abnormalities Noted: Yes Moisture Temperature / Pain No Abnormalities Noted: Yes Temperature: No Abnormality Tenderness on Palpation: Yes Treatment Notes Wound #1 (Foot) Wound Laterality: Dorsal, Right Cleanser Soap and Water Discharge Instruction: May shower and wash wound with dial antibacterial soap and water prior to dressing change. Wound Cleanser Discharge Instruction: Cleanse the wound with wound cleanser prior to applying a clean dressing using gauze sponges, not tissue or cotton balls. Peri-Wound Care Topical Gentamicin Discharge Instruction: As directed by physician Mupirocin Ointment Discharge Instruction: Apply Mupirocin (Bactroban) as instructed Primary Dressing Hydrofera Blue Ready Foam, 2.5 x2.5 in Discharge Instruction: Apply over the [place]. Secondary Dressing ABD Pad, 5x9 Discharge Instruction: Apply over primary dressing as directed. Secured With Compression Wrap Kerlix Roll 4.5x3.1 (in/yd) Discharge Instruction: Apply Kerlix and Coban compression as directed. Coban Self-Adherent Wrap 4x5 (in/yd) Discharge Instruction: Apply over Kerlix as directed. Compression Stockings Add-Ons Electronic Signature(s) Signed: 04/19/2022 4:59:17 PM By: Deon Pilling RN, BSN Entered By: Deon Pilling on 04/19/2022 14:56:39 -------------------------------------------------------------------------------- Vitals Details Patient Name: Date of Service: [name] [name] CK, A LLISO Delane Ginger E. 04/19/2022 2:30 PM Medical Record Number: [id number] Patient Account Number: [id number] Date of Birth/Sex: Treating RN: December 12, 1960 (61 y.o. [name] Primary Care Vikrant Pryce: Leanna Battles Other Clinician: Referring Kelci Petrella: Treating Shoshanah Dapper/Extender: Makaelyn, Aponte ([id number]) [id number].pdf Page 9 of 9 Weeks in  Treatment: 8 Vital Signs Time Taken: 14:50 Temperature (F): 98.3 Height (in): 62 Pulse (bpm): 78 Weight (lbs): 110  Respiratory Rate (breaths/min): 20 Body Mass Index (BMI): 20.1 Blood Pressure (mmHg): 137/80 Reference Range: 80 - 120 mg / dl Electronic Signature(s) Signed: 04/19/2022 4:59:17 PM By: Deon Pilling RN, BSN Entered By: Deon Pilling on 04/19/2022 14:55:10

## 2022-04-20 ENCOUNTER — Ambulatory Visit (HOSPITAL_BASED_OUTPATIENT_CLINIC_OR_DEPARTMENT_OTHER): Payer: 59 | Admitting: Internal Medicine

## 2022-04-21 DIAGNOSIS — Z1212 Encounter for screening for malignant neoplasm of rectum: Secondary | ICD-10-CM | POA: Diagnosis not present

## 2022-04-26 ENCOUNTER — Encounter (HOSPITAL_BASED_OUTPATIENT_CLINIC_OR_DEPARTMENT_OTHER): Payer: 59 | Admitting: Internal Medicine

## 2022-04-27 ENCOUNTER — Encounter (HOSPITAL_BASED_OUTPATIENT_CLINIC_OR_DEPARTMENT_OTHER): Payer: 59 | Admitting: Internal Medicine

## 2022-04-27 ENCOUNTER — Encounter (HOSPITAL_BASED_OUTPATIENT_CLINIC_OR_DEPARTMENT_OTHER): Payer: 59 | Attending: Internal Medicine | Admitting: Internal Medicine

## 2022-04-27 DIAGNOSIS — T24231A Burn of second degree of right lower leg, initial encounter: Secondary | ICD-10-CM | POA: Insufficient documentation

## 2022-04-27 DIAGNOSIS — L03115 Cellulitis of right lower limb: Secondary | ICD-10-CM | POA: Insufficient documentation

## 2022-04-27 DIAGNOSIS — L97512 Non-pressure chronic ulcer of other part of right foot with fat layer exposed: Secondary | ICD-10-CM | POA: Diagnosis not present

## 2022-04-27 DIAGNOSIS — L97518 Non-pressure chronic ulcer of other part of right foot with other specified severity: Secondary | ICD-10-CM | POA: Diagnosis not present

## 2022-04-27 DIAGNOSIS — E039 Hypothyroidism, unspecified: Secondary | ICD-10-CM | POA: Diagnosis not present

## 2022-04-27 DIAGNOSIS — X101XXA Contact with hot food, initial encounter: Secondary | ICD-10-CM | POA: Insufficient documentation

## 2022-04-28 NOTE — Progress Notes (Signed)
[name], [name] ([id number]) [id number] Page 1 of 7 Visit Report for 04/27/2022 Chief Complaint Document Details Patient Name: Date of Service: [name] [name] CK, [name] [name] 04/27/2022 3:00 PM Medical Record Number: [id number] Patient Account Number: [id number] Date of Birth/Sex: Treating RN: 12/09/1960 (61 y.o. F) Primary Care Provider: Leanna Battles Other Clinician: Referring Provider: Treating Provider/Extender: Darrold Junker in Treatment: 10 Information Obtained from: Patient Chief Complaint 02/16/2022; burn to the right foot Electronic Signature(s) Signed: 04/27/2022 4:30:17 PM By: Kalman Shan DO Entered By: Kalman Shan on 04/27/2022 16:16:31 -------------------------------------------------------------------------------- HPI Details Patient Name: Date of Service: [name] [name] CK, [name] 04/27/2022 3:00 PM Medical Record Number: [id number] Patient Account Number: [id number] Date of Birth/Sex: Treating RN: 07/15/1960 (61 y.o. F) Primary Care Provider: Leanna Battles Other Clinician: Referring Provider: Treating Provider/Extender: Darrold Junker in Treatment: 10 History of Present Illness HPI Description: Admission 02/16/2022 [name] [name] is a 61 year old female with a past medical history of hypertension and hypothyroidism that presents to the clinic for 2-week history of burn to her right foot. She states she was cooking mashed potatoes and they fell on her foot resulting in a burn. She states she visited her primary care physician and was started on Silvadene. She had a follow-up visit and was prescribed doxycycline and mupirocin. She denies signs of infection currently. She reports minimal improvement in wound healing. 10/10; patient presents for follow-up. She states she has been using Santyl to the wound bed daily. She states she has used the entire tube over the past week.  She has increased warmth and erythema to the right foot. She states she completed doxycycline last week. 10/17; patient presents for follow-up. She just received Iodosorb in the mail and started this yesterday. She states that she squeezed out the last little bit of Santyl from the tube and has been using this to the wound bed. She is currently taking the clindamycin as prescribed at last clinic visit. She reports improvement in her symptoms. She has been using Medihoney to the toe wound. She has no issues or complaints today. 10/31; patient presents for follow-up. She states she has been using Iodosorb to the wound bed and bacitracin to the toe wound. She has no issues or complaints today. 11/14; patient presents for follow-up. She has been using Iodosorb to the wound bed. The toe wound has healed. She has no issues or complaints today. 11/21; patient presents for follow-up. She has been using Medihoney and Hydrofera Blue to the wound bed. She has no issues or complaints today. 11/27; Patient presents for follow up. She has been using medihoney and hydrofera blue to the wound bed. She had a hard time getting the Tubigrip on and has not been using this. 12/4; patient presents for follow-up. We use antibiotic ointment with Hydrofera Blue under Kerlix/Coban at last clinic visit. She tolerated the compression wrap well. She has no issues or complaints today. 12/12; patient presents for follow-up. We have been using Hydrofera Blue with antibiotic ointment under Kerlix/Coban. She has no issues or complaints today. Electronic Signature(s) [name], [name] ([id number]) [id number] Page 2 of 7 Signed: 04/27/2022 4:30:17 PM By: Kalman Shan DO Entered By: Kalman Shan on 04/27/2022 16:23:05 -------------------------------------------------------------------------------- Physical Exam Details Patient Name: Date of Service: [name] NNO CK, [name] 04/27/2022 3:00  PM Medical Record Number: [id number] Patient Account Number: [id number] Date of Birth/Sex: Treating RN: 18-Jan-1961 (61 y.o. F) Primary Care Provider: Leanna Battles Other Clinician:  Referring Provider: Treating Provider/Extender: Darrold Junker in Treatment: 10 Constitutional respirations regular, non-labored and within target range for patient.. Cardiovascular 2+ dorsalis pedis/posterior tibialis pulses. Psychiatric pleasant and cooperative. Notes Right foot: T the dorsal aspect there is an open wound with granulation tissue throughout. No signs of surrounding infection. o Electronic Signature(s) Signed: 04/27/2022 4:30:17 PM By: Kalman Shan DO Entered By: Kalman Shan on 04/27/2022 16:23:40 -------------------------------------------------------------------------------- Physician Orders Details Patient Name: Date of Service: [name] [name] CK, [name] 04/27/2022 3:00 PM Medical Record Number: [id number] Patient Account Number: [id number] Date of Birth/Sex: Treating RN: Jan 15, 1961 (61 y.o. [name] [name], [name] [name] Primary Care Provider: Leanna Battles Other Clinician: Referring Provider: Treating Provider/Extender: Darrold Junker in Treatment: 10 Verbal / Phone Orders: No Diagnosis Coding ICD-10 Coding Code Description V56.433I Burn of second degree of right lower leg, initial encounter E03.9 Hypothyroidism, unspecified L03.115 Cellulitis of right lower limb L97.518 Non-pressure chronic ulcer of other part of right foot with other specified severity Follow-up Appointments ppointment in 1 week. - Dr. Heber [place] Return A ppointment in 2 weeks. - Dr. Heber [place] Return A Anesthetic (In clinic) Topical Lidocaine 5% applied to wound bed Bathing/ Shower/ Hygiene May shower with protection but do not get wound dressing(s) wet. [name] [name] ([id number]) [id number] Page 3 of 7 Edema Control -  Lymphedema / SCD / Other Elevate legs to the level of the heart or above for 30 minutes daily and/or when sitting, a frequency of: Avoid standing for long periods of time. Wound Treatment Wound #1 - Foot Wound Laterality: Dorsal, Right Cleanser: Soap and Water 1 x Per Week/15 Days Discharge Instructions: May shower and wash wound with dial antibacterial soap and water prior to dressing change. Cleanser: Wound Cleanser (Generic) 1 x Per Week/15 Days Discharge Instructions: Cleanse the wound with wound cleanser prior to applying a clean dressing using gauze sponges, not tissue or cotton balls. Prim Dressing: Hydrofera Blue Ready Foam, 2.5 x2.5 in 1 x Per Week/15 Days ary Discharge Instructions: Apply over the [place]. Secondary Dressing: Woven Gauze Sponge, Non-Sterile 4x4 in 1 x Per Week/15 Days Discharge Instructions: Apply over primary dressing as directed. Compression Wrap: Kerlix Roll 4.5x3.1 (in/yd) 1 x Per Week/15 Days Discharge Instructions: Apply Kerlix and Coban compression as directed. Compression Wrap: Coban Self-Adherent Wrap 4x5 (in/yd) 1 x Per Week/15 Days Discharge Instructions: Apply over Kerlix as directed. Electronic Signature(s) Signed: 04/27/2022 4:30:17 PM By: Kalman Shan DO Entered By: Kalman Shan on 04/27/2022 16:23:59 -------------------------------------------------------------------------------- Problem List Details Patient Name: Date of Service: [name] [name] CK, [name] 04/27/2022 3:00 PM Medical Record Number: [id number] Patient Account Number: [id number] Date of Birth/Sex: Treating RN: 04/18/1961 (61 y.o. [name] [name], [name] [name] Primary Care Provider: Leanna Battles Other Clinician: Referring Provider: Treating Provider/Extender: Darrold Junker in Treatment: 10 Active Problems ICD-10 Encounter Code Description Active Date MDM Diagnosis T24.231A Burn of second degree of right lower leg, initial encounter 02/16/2022 No  Yes E03.9 Hypothyroidism, unspecified 02/16/2022 No Yes L03.115 Cellulitis of right lower limb 02/23/2022 No Yes L97.518 Non-pressure chronic ulcer of other part of right foot with other specified 03/30/2022 No Yes severity Inactive Problems Resolved Problems [name], [name] ([id number]) [id number] Page 4 of 7 Electronic Signature(s) Signed: 04/27/2022 4:30:17 PM By: Kalman Shan DO Entered By: Kalman Shan on 04/27/2022 16:15:52 -------------------------------------------------------------------------------- Progress Note Details Patient Name: Date of Service: [name] [name] CK, [name] 04/27/2022 3:00 PM Medical Record Number: [id number] Patient Account Number: [id number] Date of  Birth/Sex: Treating RN: 04/21/1961 (61 y.o. F) Primary Care Provider: Leanna Battles Other Clinician: Referring Provider: Treating Provider/Extender: Darrold Junker in Treatment: 10 Subjective Chief Complaint Information obtained from Patient 02/16/2022; burn to the right foot History of Present Illness (HPI) Admission 02/16/2022 Ms. Eydie Wormley is a 61 year old female with a past medical history of hypertension and hypothyroidism that presents to the clinic for 2-week history of burn to her right foot. She states she was cooking mashed potatoes and they fell on her foot resulting in a burn. She states she visited her primary care physician and was started on Silvadene. She had a follow-up visit and was prescribed doxycycline and mupirocin. She denies signs of infection currently. She reports minimal improvement in wound healing. 10/10; patient presents for follow-up. She states she has been using Santyl to the wound bed daily. She states she has used the entire tube over the past week. She has increased warmth and erythema to the right foot. She states she completed doxycycline last week. 10/17; patient presents for follow-up. She just  received Iodosorb in the mail and started this yesterday. She states that she squeezed out the last little bit of Santyl from the tube and has been using this to the wound bed. She is currently taking the clindamycin as prescribed at last clinic visit. She reports improvement in her symptoms. She has been using Medihoney to the toe wound. She has no issues or complaints today. 10/31; patient presents for follow-up. She states she has been using Iodosorb to the wound bed and bacitracin to the toe wound. She has no issues or complaints today. 11/14; patient presents for follow-up. She has been using Iodosorb to the wound bed. The toe wound has healed. She has no issues or complaints today. 11/21; patient presents for follow-up. She has been using Medihoney and Hydrofera Blue to the wound bed. She has no issues or complaints today. 11/27; Patient presents for follow up. She has been using medihoney and hydrofera blue to the wound bed. She had a hard time getting the Tubigrip on and has not been using this. 12/4; patient presents for follow-up. We use antibiotic ointment with Hydrofera Blue under Kerlix/Coban at last clinic visit. She tolerated the compression wrap well. She has no issues or complaints today. 12/12; patient presents for follow-up. We have been using Hydrofera Blue with antibiotic ointment under Kerlix/Coban. She has no issues or complaints today. Patient History Information obtained from Patient, Chart. Family History Unknown History. Social History Current every day smoker, Marital Status - Married, Alcohol Use - Moderate, Drug Use - No History, Caffeine Use - Moderate. Medical History Cardiovascular Patient has history of Hypertension Medical A Surgical History Notes nd Gastrointestinal GERD Endocrine thyroid disease [name], [name] ([id number]) [id number] Page 5 of 7 Objective Constitutional respirations regular, non-labored and within  target range for patient.. Vitals Time Taken: 3:21 PM, Height: 62 in, Weight: 110 lbs, BMI: 20.1. Cardiovascular 2+ dorsalis pedis/posterior tibialis pulses. Psychiatric pleasant and cooperative. General Notes: Right foot: T the dorsal aspect there is an open wound with granulation tissue throughout. No signs of surrounding infection. o Integumentary (Hair, Skin) Wound #1 status is Open. Original cause of wound was Thermal Burn. The date acquired was: 02/02/2022. The wound has been in treatment 10 weeks. The wound is located on the Right,Dorsal Foot. The wound measures 2cm length x 0.8cm width x 0.1cm depth; 1.257cm^2 area and 0.126cm^3 volume. There is Fat Layer (Subcutaneous Tissue) exposed. There is no tunneling  or undermining noted. There is a medium amount of serosanguineous drainage noted. The wound margin is distinct with the outline attached to the wound base. There is large (67-100%) red, pink granulation within the wound bed. There is a small (1-33%) amount of necrotic tissue within the wound bed including Adherent Slough. The periwound skin appearance had no abnormalities noted for texture. The periwound skin appearance had no abnormalities noted for moisture. The periwound skin appearance had no abnormalities noted for color. Periwound temperature was noted as No Abnormality. The periwound has tenderness on palpation. Assessment Active Problems ICD-10 Burn of second degree of right lower leg, initial encounter Hypothyroidism, unspecified Cellulitis of right lower limb Non-pressure chronic ulcer of other part of right foot with other specified severity Patient's wound appears well-healing. I recommended continuing Hydrofera Blue under compression therapy. Follow-up in 1 week. Plan Follow-up Appointments: Return Appointment in 1 week. - Dr. Heber [place] Return Appointment in 2 weeks. - Dr. Heber [place] Anesthetic: (In clinic) Topical Lidocaine 5% applied to wound bed Bathing/ Shower/  Hygiene: May shower with protection but do not get wound dressing(s) wet. Edema Control - Lymphedema / SCD / Other: Elevate legs to the level of the heart or above for 30 minutes daily and/or when sitting, a frequency of: Avoid standing for long periods of time. WOUND #1: - Foot Wound Laterality: Dorsal, Right Cleanser: Soap and Water 1 x Per Week/15 Days Discharge Instructions: May shower and wash wound with dial antibacterial soap and water prior to dressing change. Cleanser: Wound Cleanser (Generic) 1 x Per Week/15 Days Discharge Instructions: Cleanse the wound with wound cleanser prior to applying a clean dressing using gauze sponges, not tissue or cotton balls. Prim Dressing: Hydrofera Blue Ready Foam, 2.5 x2.5 in 1 x Per Week/15 Days ary Discharge Instructions: Apply over the [place]. Secondary Dressing: Woven Gauze Sponge, Non-Sterile 4x4 in 1 x Per Week/15 Days Discharge Instructions: Apply over primary dressing as directed. Com pression Wrap: Kerlix Roll 4.5x3.1 (in/yd) 1 x Per Week/15 Days Discharge Instructions: Apply Kerlix and Coban compression as directed. Com pression Wrap: Coban Self-Adherent Wrap 4x5 (in/yd) 1 x Per Week/15 Days Discharge Instructions: Apply over Kerlix as directed. 1. Hydrofera Blue under Kerlix/Coban 2. Follow-up in 1 week Electronic Signature(s) Signed: 04/27/2022 4:30:17 PM By: Kalman Shan DO Entered By: Kalman Shan on 04/27/2022 16:24:34 [name] [name] ([id number]) [id number] Page 6 of 7 -------------------------------------------------------------------------------- HxROS Details Patient Name: Date of Service: [name] [name] CK, [name] 04/27/2022 3:00 PM Medical Record Number: [id number] Patient Account Number: [id number] Date of Birth/Sex: Treating RN: 11-01-1960 (61 y.o. F) Primary Care Provider: Leanna Battles Other Clinician: Referring Provider: Treating Provider/Extender: Darrold Junker in Treatment: 10 Information Obtained From Patient Chart Cardiovascular Medical History: Positive for: Hypertension Gastrointestinal Medical History: Past Medical History Notes: GERD Endocrine Medical History: Past Medical History Notes: thyroid disease Immunizations Pneumococcal Vaccine: Received Pneumococcal Vaccination: No Implantable Devices None Family and Social History Unknown History: Yes; Current every day smoker; Marital Status - Married; Alcohol Use: Moderate; Drug Use: No History; Caffeine Use: Moderate; Financial Concerns: No; Food, Clothing or Shelter Needs: No; Support System Lacking: No; Transportation Concerns: No Electronic Signature(s) Signed: 04/27/2022 4:30:17 PM By: Kalman Shan DO Entered By: Kalman Shan on 04/27/2022 16:23:09 -------------------------------------------------------------------------------- SuperBill Details Patient Name: Date of Service: [name] [name] CK, A LLISO [name] Ginger E. 04/27/2022 Medical Record Number: [id number] Patient Account Number: [id number] Date of Birth/Sex: Treating RN: 03-Sep-1960 (61 y.o. [name] [name] Primary Care Provider: Leanna Battles Other  Clinician: Referring Provider: Treating Provider/Extender: Darrold Junker in Treatment: 10 Diagnosis Coding ICD-10 Codes Code Description G89.169I Burn of second degree of right lower leg, initial encounter E03.9 Hypothyroidism, unspecified CAMIE, HAUSS ([id number]) [id number] Page 7 of 7 L03.115 Cellulitis of right lower limb L97.518 Non-pressure chronic ulcer of other part of right foot with other specified severity Facility Procedures : CPT4 Code: [id number] Description: 99213 - WOUND CARE VISIT-LEV 3 EST PT Modifier: Quantity: 1 Physician Procedures : CPT4 Code Description Modifier [id number] - WC PHYS LEVEL 3 - EST PT ICD-10 Diagnosis Description Y80.165V Burn of second  degree of right lower leg, initial encounter L97.518 Non-pressure chronic ulcer of other part of right foot with other  specified severity L03.115 Cellulitis of right lower limb E03.9 Hypothyroidism, unspecified Quantity: 1 Electronic Signature(s) Signed: 04/27/2022 4:30:17 PM By: Kalman Shan DO Entered By: Kalman Shan on 04/27/2022 16:24:49

## 2022-04-29 NOTE — Progress Notes (Signed)
[name], [name] ([id number]) [id number] Page 1 of 9 Visit Report for 04/27/2022 Arrival Information Details Patient Name: Date of Service: [name] [name] 04/27/2022 3:00 PM Medical Record Number: [id number] Patient Account Number: [id number] Date of Birth/Sex: Treating RN: 13-Apr-1961 (61 y.o. [name], [name] Primary Care Taleia Sadowski: Leanna Battles Other Clinician: Referring Estes Lehner: Treating Kalis Friese/Extender: Darrold Junker in Treatment: 10 Visit Information History Since Last Visit Added or deleted any medications: No Patient Arrived: Ambulatory Any new allergies or adverse reactions: No Arrival Time: 15:20 Had a fall or experienced change in No Accompanied By: self activities of daily living that may affect Transfer Assistance: None risk of falls: Patient Identification Verified: Yes Signs or symptoms of abuse/neglect since last visito No Secondary Verification Process Completed: Yes Hospitalized since last visit: No Patient Requires Transmission-Based Precautions: No Implantable device outside of the clinic excluding No Patient Has Alerts: No cellular tissue based products placed in the center since last visit: Has Dressing in Place as Prescribed: Yes Pain Present Now: No Electronic Signature(s) Signed: 04/29/2022 5:16:09 PM By: Rhae Hammock RN Entered By: Rhae Hammock on 04/27/2022 15:21:10 -------------------------------------------------------------------------------- Clinic Level of Care Assessment Details Patient Name: Date of Service: [name], [name] 04/27/2022 3:00 PM Medical Record Number: [id number] Patient Account Number: [id number] Date of Birth/Sex: Treating RN: Mar 30, 1961 (61 y.o. [name] [name] Primary Care Albeiro Trompeter: Leanna Battles Other Clinician: Referring Chrishana Spargur: Treating Domingo Fuson/Extender: Darrold Junker in Treatment: 10 Clinic  Level of Care Assessment Items TOOL 4 Quantity Score X- 1 0 Use when only an EandM is performed on FOLLOW-UP visit ASSESSMENTS - Nursing Assessment / Reassessment X- 1 10 Reassessment of Co-morbidities (includes updates in patient status) X- 1 5 Reassessment of Adherence to Treatment Plan ASSESSMENTS - Wound and Skin A ssessment / Reassessment X - Simple Wound Assessment / Reassessment - one wound 1 5 '[]'$  - 0 Complex Wound Assessment / Reassessment - multiple wounds '[]'$  - 0 Dermatologic / Skin Assessment (not related to wound area) ASSESSMENTS - Focused Assessment '[]'$  - 0 Circumferential Edema Measurements - multi extremities '[]'$  - 0 Nutritional Assessment / Counseling / Intervention MOHOGANY, [name] ([id number]) [id number] Page 2 of 9 '[]'$  - 0 Lower Extremity Assessment (monofilament, tuning fork, pulses) '[]'$  - 0 Peripheral Arterial Disease Assessment (using hand held doppler) ASSESSMENTS - Ostomy and/or Continence Assessment and Care '[]'$  - 0 Incontinence Assessment and Management '[]'$  - 0 Ostomy Care Assessment and Management (repouching, etc.) PROCESS - Coordination of Care X - Simple Patient / Family Education for ongoing care 1 15 '[]'$  - 0 Complex (extensive) Patient / Family Education for ongoing care X- 1 10 Staff obtains Programmer, systems, Records, T Results / Process Orders est '[]'$  - 0 Staff telephones HHA, Nursing Homes / Clarify orders / etc '[]'$  - 0 Routine Transfer to another Facility (non-emergent condition) '[]'$  - 0 Routine Hospital Admission (non-emergent condition) '[]'$  - 0 New Admissions / Biomedical engineer / Ordering NPWT Apligraf, etc. , '[]'$  - 0 Emergency Hospital Admission (emergent condition) X- 1 10 Simple Discharge Coordination '[]'$  - 0 Complex (extensive) Discharge Coordination PROCESS - Special Needs '[]'$  - 0 Pediatric / Minor Patient Management '[]'$  - 0 Isolation Patient Management '[]'$  - 0 Hearing / Language / Visual special  needs '[]'$  - 0 Assessment of Community assistance (transportation, D/C planning, etc.) '[]'$  - 0 Additional assistance / Altered mentation '[]'$  - 0 Support Surface(s) Assessment (bed, cushion, seat, etc.) INTERVENTIONS - Wound Cleansing / Measurement  X - Simple Wound Cleansing - one wound 1 5 '[]'$  - 0 Complex Wound Cleansing - multiple wounds X- 1 5 Wound Imaging (photographs - any number of wounds) '[]'$  - 0 Wound Tracing (instead of photographs) X- 1 5 Simple Wound Measurement - one wound '[]'$  - 0 Complex Wound Measurement - multiple wounds INTERVENTIONS - Wound Dressings '[]'$  - 0 Small Wound Dressing one or multiple wounds '[]'$  - 0 Medium Wound Dressing one or multiple wounds X- 1 20 Large Wound Dressing one or multiple wounds '[]'$  - 0 Application of Medications - topical '[]'$  - 0 Application of Medications - injection INTERVENTIONS - Miscellaneous '[]'$  - 0 External ear exam '[]'$  - 0 Specimen Collection (cultures, biopsies, blood, body fluids, etc.) '[]'$  - 0 Specimen(s) / Culture(s) sent or taken to Lab for analysis '[]'$  - 0 Patient Transfer (multiple staff / Civil Service fast streamer / Similar devices) '[]'$  - 0 Simple Staple / Suture removal (25 or less) '[]'$  - 0 Complex Staple / Suture removal (26 or more) '[]'$  - 0 Hypo / Hyperglycemic Management (close monitor of Blood Glucose) MARYBEL, ALCOTT ([id number]) [id number] Page 3 of 9 '[]'$  - 0 Ankle / Brachial Index (ABI) - do not check if billed separately X- 1 5 Vital Signs Has the patient been seen at the hospital within the last three years: Yes Total Score: 95 Level Of Care: New/Established - Level 3 Electronic Signature(s) Signed: 04/27/2022 4:43:40 PM By: Deon Pilling RN, BSN Entered By: Deon Pilling on 04/27/2022 15:43:00 -------------------------------------------------------------------------------- Encounter Discharge Information Details Patient Name: Date of Service: [name] [name] CK, [name] 04/27/2022 3:00  PM Medical Record Number: [id number] Patient Account Number: [id number] Date of Birth/Sex: Treating RN: 1960-08-07 (61 y.o. [name], [name] Primary Care Launa Goedken: Leanna Battles Other Clinician: Referring Akesha Uresti: Treating Naasir Carreira/Extender: Darrold Junker in Treatment: 10 Encounter Discharge Information Items Discharge Condition: Stable Ambulatory Status: Ambulatory Discharge Destination: Home Transportation: Private Auto Accompanied By: self Schedule Follow-up Appointment: Yes Clinical Summary of Care: Electronic Signature(s) Signed: 04/27/2022 4:43:40 PM By: Deon Pilling RN, BSN Entered By: Deon Pilling on 04/27/2022 15:43:24 -------------------------------------------------------------------------------- Lower Extremity Assessment Details Patient Name: Date of Service: [name], [name] 04/27/2022 3:00 PM Medical Record Number: [id number] Patient Account Number: [id number] Date of Birth/Sex: Treating RN: 1960/09/28 (61 y.o. [name], [name] Primary Care Areli Jowett: Leanna Battles Other Clinician: Referring Jaqualin Serpa: Treating Darryl Willner/Extender: Darrold Junker in Treatment: 10 Edema Assessment Assessed: Shirlyn Goltz: No] Patrice Paradise: Yes] Edema: [Left: N] [Right: o] Calf Left: Right: Point of Measurement: From Medial Instep 28 cm Ankle Left: Right: Point of Measurement: From Medial Instep 17.5 cm Vascular Assessment TRANNIE, BARDALES ([id number]) [Right:[id number] Page 4 of 9] Pulses: Dorsalis Pedis Palpable: [Right:Yes] Posterior Tibial Palpable: [Right:Yes] Electronic Signature(s) Signed: 04/29/2022 5:16:09 PM By: Rhae Hammock RN Entered By: Rhae Hammock on 04/27/2022 15:21:32 -------------------------------------------------------------------------------- Multi Wound Chart Details Patient Name: Date of Service: [name] [name] CK, [name] 04/27/2022 3:00 PM Medical Record  Number: [id number] Patient Account Number: [id number] Date of Birth/Sex: Treating RN: 27-May-1960 (61 y.o. F) Primary Care Armon Orvis: Leanna Battles Other Clinician: Referring Toriann Spadoni: Treating Kelcie Currie/Extender: Darrold Junker in Treatment: 10 [1:Photos:] [N/A:N/A] Right, Dorsal Foot N/A N/A Wound Location: Thermal Burn N/A N/A Wounding Event: 2nd degree Burn N/A N/A Primary Etiology: Hypertension N/A N/A Comorbid History: 02/02/2022 N/A N/A Date Acquired: 10 N/A N/A Weeks of Treatment: Open N/A N/A Wound Status: No N/A N/A Wound Recurrence: Yes N/A N/A Clustered Wound:  1 N/A N/A Clustered Quantity: 2x0.8x0.1 N/A N/A Measurements L x W x D (cm) 1.257 N/A N/A A (cm) : rea 0.126 N/A N/A Volume (cm) : 97.30% N/A N/A % Reduction in A rea: 98.70% N/A N/A % Reduction in Volume: Full Thickness With Exposed Support N/A N/A Classification: Structures Medium N/A N/A Exudate Amount: Serosanguineous N/A N/A Exudate Type: red, brown N/A N/A Exudate Color: Distinct, outline attached N/A N/A Wound Margin: Large (67-100%) N/A N/A Granulation Amount: Red, Pink N/A N/A Granulation Quality: Small (1-33%) N/A N/A Necrotic Amount: Fat Layer (Subcutaneous Tissue): Yes N/A N/A Exposed Structures: Fascia: No Tendon: No Muscle: No Joint: No Bone: No Large (67-100%) N/A N/A Epithelialization: Excoriation: No N/A N/A Periwound Skin Texture: Induration: No Callus: No Crepitus: No Rash: No Scarring: No Maceration: No N/A N/A Periwound Skin Moisture: Dry/Scaly: No Atrophie Blanche: No N/A N/A Periwound Skin Color: Cyanosis: No Ecchymosis: No JACARA, BENITO ([id number]) [id number] Page 5 of 9 Erythema: No Hemosiderin Staining: No Mottled: No Pallor: No Rubor: No No Abnormality N/A N/A Temperature: Yes N/A N/A Tenderness on Palpation: Treatment Notes Wound #1 (Foot) Wound Laterality: Dorsal,  Right Cleanser Soap and Water Discharge Instruction: May shower and wash wound with dial antibacterial soap and water prior to dressing change. Wound Cleanser Discharge Instruction: Cleanse the wound with wound cleanser prior to applying a clean dressing using gauze sponges, not tissue or cotton balls. Peri-Wound Care Topical Primary Dressing Hydrofera Blue Ready Foam, 2.5 x2.5 in Discharge Instruction: Apply over the [place]. Secondary Dressing Woven Gauze Sponge, Non-Sterile 4x4 in Discharge Instruction: Apply over primary dressing as directed. Secured With Compression Wrap Kerlix Roll 4.5x3.1 (in/yd) Discharge Instruction: Apply Kerlix and Coban compression as directed. Coban Self-Adherent Wrap 4x5 (in/yd) Discharge Instruction: Apply over Kerlix as directed. Compression Stockings Add-Ons Electronic Signature(s) Signed: 04/27/2022 4:30:17 PM By: Kalman Shan DO Entered By: Kalman Shan on 04/27/2022 16:15:57 -------------------------------------------------------------------------------- Multi-Disciplinary Care Plan Details Patient Name: Date of Service: [name] [name] CK, [name] 04/27/2022 3:00 PM Medical Record Number: [id number] Patient Account Number: [id number] Date of Birth/Sex: Treating RN: 06-20-1960 (61 y.o. [name] [name] Primary Care Kathi Dohn: Leanna Battles Other Clinician: Referring Drianna Chandran: Treating Armida Vickroy/Extender: Darrold Junker in Treatment: 10 Active Inactive Wound/Skin Impairment Nursing Diagnoses: Impaired tissue integrity Knowledge deficit related to smoking impact on wound healing Knowledge deficit related to ulceration/compromised skin integrity Goals: LAJUNE, PERINE ([id number]) [id number] Page 6 of 9 Patient will demonstrate a reduced rate of smoking or cessation of smoking Date Initiated: 02/16/2022 Target Resolution Date: 05/14/2022 Goal Status: Active Patient will have  a decrease in wound volume by X% from date: (specify in notes) Date Initiated: 02/16/2022 Target Resolution Date: 05/13/2022 Goal Status: Active Patient/caregiver will verbalize understanding of skin care regimen Date Initiated: 02/16/2022 Target Resolution Date: 05/14/2022 Goal Status: Active Interventions: Assess patient/caregiver ability to obtain necessary supplies Assess patient/caregiver ability to perform ulcer/skin care regimen upon admission and as needed Assess ulceration(s) every visit Provide education on smoking Notes: Electronic Signature(s) Signed: 04/27/2022 4:43:40 PM By: Deon Pilling RN, BSN Entered By: Deon Pilling on 04/27/2022 15:32:23 -------------------------------------------------------------------------------- Pain Assessment Details Patient Name: Date of Service: [name] [name] CK, [name] 04/27/2022 3:00 PM Medical Record Number: [id number] Patient Account Number: [id number] Date of Birth/Sex: Treating RN: July 03, 1960 (61 y.o. [name], [name] Primary Care Arlyce Circle: Leanna Battles Other Clinician: Referring Luvinia Lucy: Treating Samarah Hogle/Extender: Darrold Junker in Treatment: 10 Active Problems Location of Pain Severity and Description of Pain Patient Has  Paino No Site Locations Pain Management and Medication Current Pain Management: Electronic Signature(s) Signed: 04/29/2022 5:16:09 PM By: Rhae Hammock RN Entered By: Rhae Hammock on 04/27/2022 15:21:24 Burgess Amor ([id number]) [id number] Page 7 of 9 -------------------------------------------------------------------------------- Patient/Caregiver Education Details Patient Name: Date of Service: [name] [name] 12/12/2023andnbsp3:00 PM Medical Record Number: [id number] Patient Account Number: [id number] Date of Birth/Gender: Treating RN: Nov 18, 1960 (61 y.o. [name] [name] Primary Care Physician: Leanna Battles Other  Clinician: Referring Physician: Treating Physician/Extender: Darrold Junker in Treatment: 10 Education Assessment Education Provided To: Patient Education Topics Provided Wound/Skin Impairment: Handouts: Skin Care Do's and Dont's Methods: Explain/Verbal Responses: Reinforcements needed Electronic Signature(s) Signed: 04/27/2022 4:43:40 PM By: Deon Pilling RN, BSN Entered By: Deon Pilling on 04/27/2022 15:33:21 -------------------------------------------------------------------------------- Wound Assessment Details Patient Name: Date of Service: [name] [name] CK, [name] 04/27/2022 3:00 PM Medical Record Number: [id number] Patient Account Number: [id number] Date of Birth/Sex: Treating RN: Sep 17, 1960 (61 y.o. [name], [name] Primary Care Sequoya Hogsett: Leanna Battles Other Clinician: Referring Jorie Zee: Treating Satin Boal/Extender: Darrold Junker in Treatment: 10 Wound Status Wound Number: 1 Primary Etiology: 2nd degree Burn Wound Location: Right, Dorsal Foot Wound Status: Open Wounding Event: Thermal Burn Comorbid History: Hypertension Date Acquired: 02/02/2022 Weeks Of Treatment: 10 Clustered Wound: Yes Photos Wound Measurements Length: (cm) YAILENE, BADIA ([id number]) Width: (cm) Depth: (cm) Clustered Quantity: Area: (cm) Volume: (cm) 2 % Reduction in Area: 97.3% [id number] Page 8 of 9 0.8 % Reduction in Volume: 98.7% 0.1 Epithelialization: Large (67-100%) 1 Tunneling: No 1.257 Undermining: No 0.126 Wound Description Classification: Full Thickness With Exposed Support Structures Wound Margin: Distinct, outline attached Exudate Amount: Medium Exudate Type: Serosanguineous Exudate Color: red, brown Foul Odor After Cleansing: No Slough/Fibrino Yes Wound Bed Granulation Amount: Large (67-100%) Exposed Structure Granulation Quality: Red, Pink Fascia Exposed: No Necrotic  Amount: Small (1-33%) Fat Layer (Subcutaneous Tissue) Exposed: Yes Necrotic Quality: Adherent Slough Tendon Exposed: No Muscle Exposed: No Joint Exposed: No Bone Exposed: No Periwound Skin Texture Texture Color No Abnormalities Noted: Yes No Abnormalities Noted: Yes Moisture Temperature / Pain No Abnormalities Noted: Yes Temperature: No Abnormality Tenderness on Palpation: Yes Treatment Notes Wound #1 (Foot) Wound Laterality: Dorsal, Right Cleanser Soap and Water Discharge Instruction: May shower and wash wound with dial antibacterial soap and water prior to dressing change. Wound Cleanser Discharge Instruction: Cleanse the wound with wound cleanser prior to applying a clean dressing using gauze sponges, not tissue or cotton balls. Peri-Wound Care Topical Primary Dressing Hydrofera Blue Ready Foam, 2.5 x2.5 in Discharge Instruction: Apply over the [place]. Secondary Dressing Woven Gauze Sponge, Non-Sterile 4x4 in Discharge Instruction: Apply over primary dressing as directed. Secured With Compression Wrap Kerlix Roll 4.5x3.1 (in/yd) Discharge Instruction: Apply Kerlix and Coban compression as directed. Coban Self-Adherent Wrap 4x5 (in/yd) Discharge Instruction: Apply over Kerlix as directed. Compression Stockings Add-Ons Electronic Signature(s) Signed: 04/28/2022 12:00:46 PM By: Erenest Blank Signed: 04/29/2022 5:16:09 PM By: Rhae Hammock RN Entered By: Erenest Blank on 04/27/2022 15:29:04 Burgess Amor ([id number]) [id number] Page 9 of 9 -------------------------------------------------------------------------------- Vitals Details Patient Name: Date of Service: [name] [name] CK, [name] 04/27/2022 3:00 PM Medical Record Number: [id number] Patient Account Number: [id number] Date of Birth/Sex: Treating RN: [birth year] (61 y.o. [name], [name] Primary Care Perlie Stene: Leanna Battles Other Clinician: Referring  Kewana Sanon: Treating Stacy Sailer/Extender: Darrold Junker in Treatment: 10 Vital Signs Time Taken: 15:21 Reference Range: 80 - 120 mg / dl Height (in): 62  Weight (lbs): 110 Body Mass Index (BMI): 20.1 Electronic Signature(s) Signed: 04/29/2022 5:16:09 PM By: Rhae Hammock RN Entered By: Rhae Hammock on 04/27/2022 15:21:20

## 2022-05-04 ENCOUNTER — Encounter (HOSPITAL_BASED_OUTPATIENT_CLINIC_OR_DEPARTMENT_OTHER): Payer: 59 | Admitting: Internal Medicine

## 2022-05-04 DIAGNOSIS — L97518 Non-pressure chronic ulcer of other part of right foot with other specified severity: Secondary | ICD-10-CM

## 2022-05-04 DIAGNOSIS — L03115 Cellulitis of right lower limb: Secondary | ICD-10-CM | POA: Diagnosis not present

## 2022-05-04 DIAGNOSIS — X101XXA Contact with hot food, initial encounter: Secondary | ICD-10-CM | POA: Diagnosis not present

## 2022-05-04 DIAGNOSIS — E039 Hypothyroidism, unspecified: Secondary | ICD-10-CM | POA: Diagnosis not present

## 2022-05-04 DIAGNOSIS — T24231A Burn of second degree of right lower leg, initial encounter: Secondary | ICD-10-CM | POA: Diagnosis not present

## 2022-05-04 DIAGNOSIS — L97512 Non-pressure chronic ulcer of other part of right foot with fat layer exposed: Secondary | ICD-10-CM | POA: Diagnosis not present

## 2022-05-06 NOTE — Progress Notes (Signed)
MARGUERITA, STAPP ([id number]) [phone].pdf Page 1 of 7 Visit Report for 05/04/2022 Arrival Information Details Patient Name: Date of Service: [name] [name] 05/04/2022 1:15 PM Medical Record Number: [id number] Patient Account Number: [id number] Date of Birth/Sex: Treating RN: 1960-12-09 (61 y.o. F) Primary Care Nakaiya Beddow: Leanna Battles Other Clinician: Referring Nitzia Perren: Treating Karmon Andis/Extender: Darrold Junker in Treatment: 11 Visit Information History Since Last Visit Added or deleted any medications: No Patient Arrived: Ambulatory Any new allergies or adverse reactions: No Arrival Time: 13:21 Had [name] fall or experienced change in No Accompanied By: self activities of daily living that may affect Transfer Assistance: None risk of falls: Patient Identification Verified: Yes Signs or symptoms of abuse/neglect since last visito No Secondary Verification Process Completed: Yes Hospitalized since last visit: No Patient Requires Transmission-Based Precautions: No Implantable device outside of the clinic excluding No Patient Has Alerts: No cellular tissue based products placed in the center since last visit: Has Compression in Place as Prescribed: Yes Pain Present Now: No Electronic Signature(s) Signed: 05/04/2022 3:33:43 PM By: Erenest Blank Entered By: Erenest Blank on 05/04/2022 13:22:04 -------------------------------------------------------------------------------- Encounter Discharge Information Details Patient Name: Date of Service: [name] [name], [name] LLISO [name] Ginger E. 05/04/2022 1:15 PM Medical Record Number: [id number] Patient Account Number: [id number] Date of Birth/Sex: Treating RN: 28-Nov-1960 (61 y.o. [name] [name], [name] [name] Primary Care Fumi Guadron: Leanna Battles Other Clinician: Referring Alexiz Sustaita: Treating Aidel Davisson/Extender: Darrold Junker in Treatment: 11 Encounter Discharge Information  Items Post Procedure Vitals Discharge Condition: Stable Temperature (F): 98.7 Ambulatory Status: Ambulatory Pulse (bpm): 74 Discharge Destination: Home Respiratory Rate (breaths/min): 17 Transportation: Private Auto Blood Pressure (mmHg): 120/80 Accompanied By: self Schedule Follow-up Appointment: Yes Clinical Summary of Care: Patient Declined Electronic Signature(s) Signed: 05/05/2022 5:39:23 PM By: Rhae Hammock RN Entered By: Rhae Hammock on 05/04/2022 14:00:14 Burgess Amor ([id number]) [id number] Page 2 of 7 -------------------------------------------------------------------------------- Lower Extremity Assessment Details Patient Name: Date of Service: [name] [name] 05/04/2022 1:15 PM Medical Record Number: [id number] Patient Account Number: [id number] Date of Birth/Sex: Treating RN: [birth year] (61 y.o. [name] [name], [name] [name] Primary Care Marico Buckle: Leanna Battles Other Clinician: Referring Delanie Tirrell: Treating Tryone Kille/Extender: Darrold Junker in Treatment: 11 Edema Assessment Assessed: Shirlyn Goltz: No] Patrice Paradise: Yes] Edema: [Left: N] [Right: o] Calf Left: Right: Point of Measurement: From Medial Instep 28 cm Ankle Left: Right: Point of Measurement: From Medial Instep 17.5 cm Vascular Assessment Pulses: Dorsalis Pedis Palpable: [Right:Yes] Posterior Tibial Palpable: [Right:Yes] Electronic Signature(s) Signed: 05/05/2022 5:39:23 PM By: Rhae Hammock RN Entered By: Rhae Hammock on 05/04/2022 13:32:42 -------------------------------------------------------------------------------- Multi Wound Chart Details Patient Name: Date of Service: [name] [name], [name] LLISO [name] Ginger E. 05/04/2022 1:15 PM Medical Record Number: [id number] Patient Account Number: [id number] Date of Birth/Sex: Treating RN: July 17, 1960 (61 y.o. F) Primary Care Letia Guidry: Leanna Battles Other Clinician: Referring Hercules Hasler: Treating  Rhaya Coale/Extender: Darrold Junker in Treatment: 11 Vital Signs Height(in): 62 Pulse(bpm): 74 Weight(lbs): 110 Blood Pressure(mmHg): 166/73 Body Mass Index(BMI): 20.1 Temperature(F): 98.7 Respiratory Rate(breaths/min): 17 [1:Photos:] [N/[name]:N/[name]] Right, Dorsal Foot N/[name] N/[name] Wound Location: Thermal Burn N/[name] N/[name] Wounding Event: 2nd degree Burn N/[name] N/[name] Primary Etiology: Hypertension N/[name] N/[name] Comorbid History: 02/02/2022 N/[name] N/[name] Date Acquired: 11 N/[name] N/[name] Weeks of Treatment: Open N/[name] N/[name] Wound Status: No N/[name] N/[name] Wound Recurrence: Yes N/[name] N/[name] Clustered Wound: 1 N/[name] N/[name] Clustered Quantity: 1x0.3x0.1 N/[name] N/[name] Measurements L x W x D (cm) 0.236 N/[name] N/[name] [name] (cm) : rea 0.024 N/[name] N/[name]  Volume (cm) : 99.50% N/[name] N/[name] % Reduction in [name] rea: 99.70% N/[name] N/[name] % Reduction in Volume: Full Thickness With Exposed Support N/[name] N/[name] Classification: Structures Medium N/[name] N/[name] Exudate [name] mount: Serosanguineous N/[name] N/[name] Exudate Type: red, brown N/[name] N/[name] Exudate Color: Distinct, outline attached N/[name] N/[name] Wound Margin: Large (67-100%) N/[name] N/[name] Granulation [name] mount: Red, Pink N/[name] N/[name] Granulation Quality: Small (1-33%) N/[name] N/[name] Necrotic [name] mount: Fat Layer (Subcutaneous Tissue): Yes N/[name] N/[name] Exposed Structures: Fascia: No Tendon: No Muscle: No Joint: No Bone: No Large (67-100%) N/[name] N/[name] Epithelialization: Debridement - Excisional N/[name] N/[name] Debridement: Pre-procedure Verification/Time Out 13:46 N/[name] N/[name] Taken: Lidocaine N/[name] N/[name] Pain Control: Subcutaneous, Slough N/[name] N/[name] Tissue Debrided: Skin/Subcutaneous Tissue N/[name] N/[name] Level: 0.3 N/[name] N/[name] Debridement [name] (sq cm): rea Curette N/[name] N/[name] Instrument: Minimum N/[name] N/[name] Bleeding: Pressure N/[name] N/[name] Hemostasis [name] chieved: 0 N/[name] N/[name] Procedural Pain: 0 N/[name] N/[name] Post Procedural Pain: Procedure was tolerated well N/[name] N/[name] Debridement Treatment Response: 1x0.3x0.1 N/[name] N/[name] Post Debridement Measurements L x W x D (cm) 0.024 N/[name]  N/[name] Post Debridement Volume: (cm) Excoriation: No N/[name] N/[name] Periwound Skin Texture: Induration: No Callus: No Crepitus: No Rash: No Scarring: No Maceration: No N/[name] N/[name] Periwound Skin Moisture: Dry/Scaly: No Atrophie Blanche: No N/[name] N/[name] Periwound Skin Color: Cyanosis: No Ecchymosis: No Erythema: No Hemosiderin Staining: No Mottled: No Pallor: No Rubor: No No Abnormality N/[name] N/[name] Temperature: Yes N/[name] N/[name] Tenderness on Palpation: Debridement N/[name] N/[name] Procedures Performed: Treatment Notes Electronic Signature(s) Signed: 05/04/2022 1:59:20 PM By: Kalman Shan DO Entered By: Kalman Shan on 05/04/2022 13:54:41 Burgess Amor ([id number]) [id number] Page 4 of 7 -------------------------------------------------------------------------------- Multi-Disciplinary Care Plan Details Patient Name: Date of Service: [name] [name] 05/04/2022 1:15 PM Medical Record Number: [id number] Patient Account Number: [id number] Date of Birth/Sex: Treating RN: 1961/05/15 (61 y.o. [name] [name], [name] [name] Primary Care Melannie Metzner: Leanna Battles Other Clinician: Referring Amarri Michaelson: Treating [name] [name]/Extender: Darrold Junker in Treatment: 11 Active Inactive Wound/Skin Impairment Nursing Diagnoses: Impaired tissue integrity Knowledge deficit related to smoking impact on wound healing Knowledge deficit related to ulceration/compromised skin integrity Goals: Patient will demonstrate [name] reduced rate of smoking or cessation of smoking Date Initiated: 02/16/2022 Target Resolution Date: 05/14/2022 Goal Status: Active Patient will have [name] decrease in wound volume by X% from date: (specify in notes) Date Initiated: 02/16/2022 Target Resolution Date: 05/13/2022 Goal Status: Active Patient/caregiver will verbalize understanding of skin care regimen Date Initiated: 02/16/2022 Target Resolution Date: 05/14/2022 Goal Status:  Active Interventions: Assess patient/caregiver ability to obtain necessary supplies Assess patient/caregiver ability to perform ulcer/skin care regimen upon admission and as needed Assess ulceration(s) every visit Provide education on smoking Notes: Electronic Signature(s) Signed: 05/05/2022 5:39:23 PM By: Rhae Hammock RN Entered By: Rhae Hammock on 05/04/2022 13:44:11 -------------------------------------------------------------------------------- Pain Assessment Details Patient Name: Date of Service: [name] [name], [name] 05/04/2022 1:15 PM Medical Record Number: [id number] Patient Account Number: [id number] Date of Birth/Sex: Treating RN: 09/12/1960 (61 y.o. [name] [name], [name] [name] Primary Care Bhavin Monjaraz: Leanna Battles Other Clinician: Referring Thomson Herbers: Treating Ayo Smoak/Extender: Darrold Junker in Treatment: 11 Active Problems Location of Pain Severity and Description of Pain Patient Has Paino No Site Locations [name] [name], [name] [name] ([id number]) [id number].pdf Page 5 of 7 Pain Management and Medication Current Pain Management: Electronic Signature(s) Signed: 05/05/2022 5:39:23 PM By: Rhae Hammock RN Entered By: Rhae Hammock on 05/04/2022 13:41:46 -------------------------------------------------------------------------------- Patient/Caregiver Education Details Patient Name: Date of Service: [name] [name], [name] 12/19/2023andnbsp1:15 PM  Medical Record Number: [id number] Patient Account Number: [id number] Date of Birth/Gender: Treating RN: [birth year] (61 y.o. [name] [name], [name] [name] Primary Care Physician: Leanna Battles Other Clinician: Referring Physician: Treating Physician/Extender: Darrold Junker in Treatment: 11 Education Assessment Education Provided To: Patient Education Topics Provided Wound/Skin Impairment: Methods: Explain/Verbal Responses: Reinforcements  needed, State content correctly [employer]) Signed: 05/05/2022 5:39:23 PM By: Rhae Hammock RN Entered By: Rhae Hammock on 05/04/2022 13:44:31 -------------------------------------------------------------------------------- Wound Assessment Details Patient Name: Date of Service: [name] [name], [name] LLISO [name] Ginger E. 05/04/2022 1:15 PM Medical Record Number: [id number] Patient Account Number: [id number] Date of Birth/Sex: Treating RN: [birth year] (61 y.o. [name] [name], [name] [name] Primary Care Wilhelmena Zea: Leanna Battles Other Clinician: Referring Tracee Mccreery: Treating [name] [name]/Extender: [name] [name], [name] [name] ([id number]) [id number] Page 6 of 7 Weeks in Treatment: 11 Wound Status Wound Number: 1 Primary Etiology: 2nd degree Burn Wound Location: Right, Dorsal Foot Wound Status: Open Wounding Event: Thermal Burn Comorbid History: Hypertension Date Acquired: 02/02/2022 Weeks Of Treatment: 11 Clustered Wound: Yes Photos Wound Measurements Length: (cm) Width: (cm) Depth: (cm) Clustered Quantity: Area: (cm) Volume: (cm) 1 % Reduction in Area: 99.5% 0.3 % Reduction in Volume: 99.7% 0.1 Epithelialization: Large (67-100%) 1 Tunneling: No 0.236 Undermining: No 0.024 Wound Description Classification: Full Thickness With Exposed Support Structures Wound Margin: Distinct, outline attached Exudate Amount: Medium Exudate Type: Serosanguineous Exudate Color: red, brown Foul Odor After Cleansing: No Slough/Fibrino Yes Wound Bed Granulation Amount: Large (67-100%) Exposed Structure Granulation Quality: Red, Pink Fascia Exposed: No Necrotic Amount: Small (1-33%) Fat Layer (Subcutaneous Tissue) Exposed: Yes Necrotic Quality: Adherent Slough Tendon Exposed: No Muscle Exposed: No Joint Exposed: No Bone Exposed: No Periwound Skin Texture Texture Color No Abnormalities Noted: Yes No Abnormalities Noted: Yes Moisture  Temperature / Pain No Abnormalities Noted: Yes Temperature: No Abnormality Tenderness on Palpation: Yes Treatment Notes Wound #1 (Foot) Wound Laterality: Dorsal, Right Cleanser Soap and Water Discharge Instruction: May shower and wash wound with dial antibacterial soap and water prior to dressing change. Wound Cleanser Discharge Instruction: Cleanse the wound with wound cleanser prior to applying [name] clean dressing using gauze sponges, not tissue or cotton balls. Peri-Wound Care Topical Primary Dressing Hydrofera Blue Ready Foam, 2.5 x2.5 in Discharge Instruction: Apply over the [place]. [name] [name], [name] [name] ([id number]) [id number].pdf Page 7 of 7 Secondary Dressing Woven Gauze Sponge, Non-Sterile 4x4 in Discharge Instruction: Apply over primary dressing as directed. Secured With Compression Wrap Kerlix Roll 4.5x3.1 (in/yd) Discharge Instruction: Apply Kerlix and Coban compression as directed. Coban Self-Adherent Wrap 4x5 (in/yd) Discharge Instruction: Apply over Kerlix as directed. Compression Stockings Add-Ons Electronic Signature(s) Signed: 05/05/2022 5:39:23 PM By: Rhae Hammock RN Entered By: Rhae Hammock on 05/04/2022 13:43:23 -------------------------------------------------------------------------------- Vitals Details Patient Name: Date of Service: [name] [name], [name] LLISO [name] Ginger E. 05/04/2022 1:15 PM Medical Record Number: [id number] Patient Account Number: [id number] Date of Birth/Sex: Treating RN: 08/19/1960 (61 y.o. [name] [name], [name] [name] Primary Care [name] [name]: Leanna Battles Other Clinician: Referring Layali Freund: Treating [name] [name]/Extender: Darrold Junker in Treatment: 11 Vital Signs Time Taken: 13:41 Temperature (F): 98.7 Height (in): 62 Pulse (bpm): 74 Weight (lbs): 110 Respiratory Rate (breaths/min): 17 Body Mass Index (BMI): 20.1 Blood Pressure (mmHg): 166/73 Reference Range: 80 - 120 mg / dl Electronic  Signature(s) Signed: 05/05/2022 5:39:23 PM By: Rhae Hammock RN Entered By: Rhae Hammock on 05/04/2022 13:43:44

## 2022-05-06 NOTE — Progress Notes (Signed)
LYRIC, ROSSANO ([id number]) [id number] Page 1 of 8 Visit Report for 05/04/2022 Chief Complaint Document Details Patient Name: Date of Service: [name] [name] [name] 05/04/2022 1:15 PM Medical Record Number: [id number] Patient Account Number: [id number] Date of Birth/Sex: Treating RN: 1961-01-03 (61 y.o. F) Primary Care Provider: Leanna Battles Other Clinician: Referring Provider: Treating Provider/Extender: Darrold Junker in Treatment: 11 Information Obtained from: Patient Chief Complaint 02/16/2022; burn to the right foot Electronic Signature(s) Signed: 05/04/2022 1:59:20 PM By: Kalman Shan DO Entered By: Kalman Shan on 05/04/2022 13:54:49 -------------------------------------------------------------------------------- Debridement Details Patient Name: Date of Service: [name] [name] [name], [name] [name] LLISO [name] [name] [name]. 05/04/2022 1:15 PM Medical Record Number: [id number] Patient Account Number: [id number] Date of Birth/Sex: Treating RN: 1960-09-06 (61 y.o. [name] [name] [name], [name] [name] [name] Primary Care Provider: Leanna Battles Other Clinician: Referring Provider: Treating Provider/Extender: Darrold Junker in Treatment: 11 Debridement Performed for Assessment: Wound #1 Right,Dorsal Foot Performed By: Physician Kalman Shan, DO Debridement Type: Debridement Level of Consciousness (Pre-procedure): Awake and Alert Pre-procedure Verification/Time Out Yes - 13:46 Taken: Start Time: 13:46 Pain Control: Lidocaine T Area Debrided (L x W): otal 1 (cm) x 0.3 (cm) = 0.3 (cm) Tissue and other material debrided: Viable, Non-Viable, Slough, Subcutaneous, Slough Level: Skin/Subcutaneous Tissue Debridement Description: Excisional Instrument: Curette Bleeding: Minimum Hemostasis Achieved: Pressure End Time: 13:46 Procedural Pain: 0 Post Procedural Pain: 0 Response to Treatment: Procedure was tolerated well Level of  Consciousness (Post- Awake and Alert procedure): Post Debridement Measurements of Total Wound Length: (cm) 1 Width: (cm) 0.3 Depth: (cm) 0.1 Volume: (cm) 0.024 Character of Wound/Ulcer Post Debridement: Improved Post Procedure Diagnosis [name] [name] [name], [name] [name] [name] ([id number]) [id number] Page 2 of 8 Same as Pre-procedure Electronic Signature(s) Signed: 05/04/2022 1:59:20 PM By: Kalman Shan DO Signed: 05/05/2022 5:39:23 PM By: Rhae Hammock RN Entered By: Rhae Hammock on 05/04/2022 13:47:31 -------------------------------------------------------------------------------- HPI Details Patient Name: Date of Service: [name] [name] [name], [name] [name] LLISO [name] [name] [name]. 05/04/2022 1:15 PM Medical Record Number: [id number] Patient Account Number: [id number] Date of Birth/Sex: Treating RN: [birth year] (61 y.o. F) Primary Care Provider: Leanna Battles Other Clinician: Referring Provider: Treating Provider/Extender: Darrold Junker in Treatment: 11 History of Present Illness HPI Description: Admission 02/16/2022 [name] [name] is [name] [name] 61 year old female with [name] [name] past medical history of hypertension and hypothyroidism that presents to the clinic for 2-week history of burn to her right foot. She states she was cooking mashed potatoes and they fell on her foot resulting in [name] [name] burn. She states she visited her primary care physician and was started on Silvadene. She had [name] [name] follow-up visit and was prescribed doxycycline and mupirocin. She denies signs of infection currently. She reports minimal improvement in wound healing. 10/10; patient presents for follow-up. She states she has been using Santyl to the wound bed daily. She states she has used the entire tube over the past week. She has increased warmth and erythema to the right foot. She states she completed doxycycline last week. 10/17; patient presents for follow-up. She just received Iodosorb in the mail and  started this yesterday. She states that she squeezed out the last little bit of Santyl from the tube and has been using this to the wound bed. She is currently taking the clindamycin as prescribed at last clinic visit. She reports improvement in her symptoms. She has been using Medihoney to the toe wound. She has no issues or complaints today. 10/31; patient presents for follow-up. She states she has been using Iodosorb to the wound  bed and bacitracin to the toe wound. She has no issues or complaints today. 11/14; patient presents for follow-up. She has been using Iodosorb to the wound bed. The toe wound has healed. She has no issues or complaints today. 11/21; patient presents for follow-up. She has been using Medihoney and Hydrofera Blue to the wound bed. She has no issues or complaints today. 11/27; Patient presents for follow up. She has been using medihoney and hydrofera blue to the wound bed. She had [name] [name] hard time getting the Tubigrip on and has not been using this. 12/4; patient presents for follow-up. We use antibiotic ointment with Hydrofera Blue under Kerlix/Coban at last clinic visit. She tolerated the compression wrap well. She has no issues or complaints today. 12/12; patient presents for follow-up. We have been using Hydrofera Blue with antibiotic ointment under Kerlix/Coban. She has no issues or complaints today. 12/19; patient presents for follow up. We have been using Hydrofera Blue under Kerlix/Coban. She has no issues or complaints today. There is been improvement in wound healing. Electronic Signature(s) Signed: 05/04/2022 1:59:20 PM By: Kalman Shan DO Entered By: Kalman Shan on 05/04/2022 13:55:42 -------------------------------------------------------------------------------- Physical Exam Details Patient Name: Date of Service: [name] [name] [name], [name] [name] LLISO [name]. 05/04/2022 1:15 PM Medical Record Number: [id number] Patient Account Number: [id number] Date of Birth/Sex: Treating  RN: Oct 21, 1960 (61 y.o. F) Primary Care Provider: Leanna Battles Other Clinician: Referring Provider: Treating Provider/Extender: Darrold Junker in Treatment: [address] ([id number]) [id number] Page 3 of 8 Constitutional respirations regular, non-labored and within target range for patient.. Cardiovascular 2+ dorsalis pedis/posterior tibialis pulses. Psychiatric pleasant and cooperative. Notes Right foot: T the dorsal aspect there is an open wound with granulation tissue with slough. No signs of surrounding infection. o Electronic Signature(s) Signed: 05/04/2022 1:59:20 PM By: Kalman Shan DO Entered By: Kalman Shan on 05/04/2022 13:56:43 -------------------------------------------------------------------------------- Physician Orders Details Patient Name: Date of Service: [name] [name] [name], [name] [name] LLISO [name] [name] [name]. 05/04/2022 1:15 PM Medical Record Number: [id number] Patient Account Number: [id number] Date of Birth/Sex: Treating RN: [birth year] (61 y.o. [name] [name] [name], [name] [name] [name] Primary Care Provider: Leanna Battles Other Clinician: Referring Provider: Treating Provider/Extender: Darrold Junker in Treatment: 11 Verbal / Phone Orders: No Diagnosis Coding Follow-up Appointments ppointment in 1 week. - Dr. Heber [place] Return [name] [name] ppointment in 2 weeks. - Dr. Heber [place] Return [name] [name] Anesthetic (In clinic) Topical Lidocaine 5% applied to wound bed Bathing/ Shower/ Hygiene May shower with protection but do not get wound dressing(s) wet. Edema Control - Lymphedema / SCD / Other Elevate legs to the level of the heart or above for 30 minutes daily and/or when sitting, [name] [name] frequency of: Avoid standing for long periods of time. Wound Treatment Wound #1 - Foot Wound Laterality: Dorsal, Right Cleanser: Soap and Water 1 x Per Week/15 Days Discharge Instructions: May shower and wash wound with dial antibacterial soap and  water prior to dressing change. Cleanser: Wound Cleanser (Generic) 1 x Per Week/15 Days Discharge Instructions: Cleanse the wound with wound cleanser prior to applying [name] [name] clean dressing using gauze sponges, not tissue or cotton balls. Prim Dressing: Hydrofera Blue Ready Foam, 2.5 x2.5 in 1 x Per Week/15 Days ary Discharge Instructions: Apply over the [place]. Secondary Dressing: Woven Gauze Sponge, Non-Sterile 4x4 in 1 x Per Week/15 Days Discharge Instructions: Apply over primary dressing as directed. Compression Wrap: Kerlix Roll 4.5x3.1 (in/yd) 1 x Per Week/15 Days Discharge Instructions: Apply Kerlix and Coban compression as directed. Compression Wrap: Coban Self-Adherent Wrap 4x5 (  in/yd) 1 x Per Week/15 Days Discharge Instructions: Apply over Kerlix as directed. Electronic Signature(s) Signed: 05/04/2022 1:59:20 PM By: Kalman Shan DO Entered By: Kalman Shan on 05/04/2022 13:56:53 [name] [name] [name] ([id number]) [id number] Page 4 of 8 -------------------------------------------------------------------------------- Problem List Details Patient Name: Date of Service: [name] [name] [name] 05/04/2022 1:15 PM Medical Record Number: [id number] Patient Account Number: [id number] Date of Birth/Sex: Treating RN: 11/05/1960 (61 y.o. F) Primary Care Provider: Leanna Battles Other Clinician: Referring Provider: Treating Provider/Extender: Darrold Junker in Treatment: 11 Active Problems ICD-10 Encounter Code Description Active Date MDM Diagnosis T24.231A Burn of second degree of right lower leg, initial encounter 02/16/2022 No Yes E03.9 Hypothyroidism, unspecified 02/16/2022 No Yes L03.115 Cellulitis of right lower limb 02/23/2022 No Yes L97.518 Non-pressure chronic ulcer of other part of right foot with other specified 03/30/2022 No Yes severity Inactive Problems Resolved Problems Electronic Signature(s) Signed:  05/04/2022 1:59:20 PM By: Kalman Shan DO Entered By: Kalman Shan on 05/04/2022 13:54:35 -------------------------------------------------------------------------------- Progress Note Details Patient Name: Date of Service: [name] [name] [name], [name] [name] LLISO [name]. 05/04/2022 1:15 PM Medical Record Number: [id number] Patient Account Number: [id number] Date of Birth/Sex: Treating RN: [birth year] (61 y.o. F) Primary Care Provider: Leanna Battles Other Clinician: Referring Provider: Treating Provider/Extender: Darrold Junker in Treatment: 11 Subjective Chief Complaint Information obtained from Patient 02/16/2022; burn to the right foot History of Present Illness (HPI) Admission 02/16/2022 [name] [name] [name], [name] [name] [name] ([id number]) [id number] Page 5 of 8 [name] [name] is [name] [name] 61 year old female with [name] [name] past medical history of hypertension and hypothyroidism that presents to the clinic for 2-week history of burn to her right foot. She states she was cooking mashed potatoes and they fell on her foot resulting in [name] [name] burn. She states she visited her primary care physician and was started on Silvadene. She had [name] [name] follow-up visit and was prescribed doxycycline and mupirocin. She denies signs of infection currently. She reports minimal improvement in wound healing. 10/10; patient presents for follow-up. She states she has been using Santyl to the wound bed daily. She states she has used the entire tube over the past week. She has increased warmth and erythema to the right foot. She states she completed doxycycline last week. 10/17; patient presents for follow-up. She just received Iodosorb in the mail and started this yesterday. She states that she squeezed out the last little bit of Santyl from the tube and has been using this to the wound bed. She is currently taking the clindamycin as prescribed at last clinic visit. She reports improvement in her symptoms. She  has been using Medihoney to the toe wound. She has no issues or complaints today. 10/31; patient presents for follow-up. She states she has been using Iodosorb to the wound bed and bacitracin to the toe wound. She has no issues or complaints today. 11/14; patient presents for follow-up. She has been using Iodosorb to the wound bed. The toe wound has healed. She has no issues or complaints today. 11/21; patient presents for follow-up. She has been using Medihoney and Hydrofera Blue to the wound bed. She has no issues or complaints today. 11/27; Patient presents for follow up. She has been using medihoney and hydrofera blue to the wound bed. She had [name] [name] hard time getting the Tubigrip on and has not been using this. 12/4; patient presents for follow-up. We use antibiotic ointment with Hydrofera Blue under Kerlix/Coban at last clinic visit. She tolerated the compression wrap well. She has no  issues or complaints today. 12/12; patient presents for follow-up. We have been using Hydrofera Blue with antibiotic ointment under Kerlix/Coban. She has no issues or complaints today. 12/19; patient presents for follow up. We have been using Hydrofera Blue under Kerlix/Coban. She has no issues or complaints today. There is been improvement in wound healing. Patient History Information obtained from Patient, Chart. Family History Unknown History. Social History Current every day smoker, Marital Status - Married, Alcohol Use - Moderate, Drug Use - No History, Caffeine Use - Moderate. Medical History Cardiovascular Patient has history of Hypertension Medical [name] [name] Surgical History Notes nd Gastrointestinal GERD Endocrine thyroid disease Objective Constitutional respirations regular, non-labored and within target range for patient.. Vitals Time Taken: 1:41 PM, Height: 62 in, Weight: 110 lbs, BMI: 20.1, Temperature: 98.7 F, Pulse: 74 bpm, Respiratory Rate: 17 breaths/min, Blood Pressure: 166/73  mmHg. Cardiovascular 2+ dorsalis pedis/posterior tibialis pulses. Psychiatric pleasant and cooperative. General Notes: Right foot: T the dorsal aspect there is an open wound with granulation tissue with slough. No signs of surrounding infection. o Integumentary (Hair, Skin) Wound #1 status is Open. Original cause of wound was Thermal Burn. The date acquired was: 02/02/2022. The wound has been in treatment 11 weeks. The wound is located on the Right,Dorsal Foot. The wound measures 1cm length x 0.3cm width x 0.1cm depth; 0.236cm^2 area and 0.024cm^3 volume. There is Fat Layer (Subcutaneous Tissue) exposed. There is no tunneling or undermining noted. There is [name] [name] medium amount of serosanguineous drainage noted. The wound margin is distinct with the outline attached to the wound base. There is large (67-100%) red, pink granulation within the wound bed. There is [name] [name] small (1-33%) amount of necrotic tissue within the wound bed including Adherent Slough. The periwound skin appearance had no abnormalities noted for texture. The periwound skin appearance had no abnormalities noted for moisture. The periwound skin appearance had no abnormalities noted for color. Periwound temperature was noted as No Abnormality. The periwound has tenderness on palpation. [name] [name] [name], [name] [name] [name] ([id number]) [id number] Page 6 of 8 Assessment Active Problems ICD-10 Burn of second degree of right lower leg, initial encounter Hypothyroidism, unspecified Cellulitis of right lower limb Non-pressure chronic ulcer of other part of right foot with other specified severity Patient's wound has shown improvement in size and appearance since last clinic visit. I debrided nonviable tissue. I recommended continuing the course with Hydrofera Blue under Kerlix/Coban. Follow-up in 1 week. Procedures Wound #1 Pre-procedure diagnosis of Wound #1 is [name] [name] 2nd degree Burn located on the Right,Dorsal Foot . There was [name] [name]  Excisional Skin/Subcutaneous Tissue Debridement with [name] [name] total area of 0.3 sq cm performed by Kalman Shan, DO. With the following instrument(s): Curette to remove Viable and Non-Viable tissue/material. Material removed includes Subcutaneous Tissue and Slough and after achieving pain control using Lidocaine. No specimens were taken. [name] [name] time out was conducted at 13:46, prior to the start of the procedure. [name] [name] Minimum amount of bleeding was controlled with Pressure. The procedure was tolerated well with [name] [name] pain level of 0 throughout and [name] [name] pain level of 0 following the procedure. Post Debridement Measurements: 1cm length x 0.3cm width x 0.1cm depth; 0.024cm^3 volume. Character of Wound/Ulcer Post Debridement is improved. Post procedure Diagnosis Wound #1: Same as Pre-Procedure Plan Follow-up Appointments: Return Appointment in 1 week. - Dr. Heber [place] Return Appointment in 2 weeks. - Dr. Heber [place] Anesthetic: (In clinic) Topical Lidocaine 5% applied to wound bed Bathing/ Shower/ Hygiene: May shower with protection but do not get wound dressing(s) wet. Edema Control -  Lymphedema / SCD / Other: Elevate legs to the level of the heart or above for 30 minutes daily and/or when sitting, [name] [name] frequency of: Avoid standing for long periods of time. WOUND #1: - Foot Wound Laterality: Dorsal, Right Cleanser: Soap and Water 1 x Per Week/15 Days Discharge Instructions: May shower and wash wound with dial antibacterial soap and water prior to dressing change. Cleanser: Wound Cleanser (Generic) 1 x Per Week/15 Days Discharge Instructions: Cleanse the wound with wound cleanser prior to applying [name] [name] clean dressing using gauze sponges, not tissue or cotton balls. Prim Dressing: Hydrofera Blue Ready Foam, 2.5 x2.5 in 1 x Per Week/15 Days ary Discharge Instructions: Apply over the [place]. Secondary Dressing: Woven Gauze Sponge, Non-Sterile 4x4 in 1 x Per Week/15 Days Discharge Instructions: Apply over primary dressing  as directed. Com pression Wrap: Kerlix Roll 4.5x3.1 (in/yd) 1 x Per Week/15 Days Discharge Instructions: Apply Kerlix and Coban compression as directed. Com pression Wrap: Coban Self-Adherent Wrap 4x5 (in/yd) 1 x Per Week/15 Days Discharge Instructions: Apply over Kerlix as directed. 1. In office sharp debridement 2. Hydrofera Blue under Kerlix/Cobanooright lower extremity 3. Follow-up in 1 week Electronic Signature(s) Signed: 05/04/2022 1:59:20 PM By: Kalman Shan DO Entered By: Kalman Shan on 05/04/2022 13:58:38 -------------------------------------------------------------------------------- HxROS Details Patient Name: Date of Service: [name] [name] [name], [name] [name] LLISO [name]. 05/04/2022 1:15 PM [name] [name] [name] ([id number]) [id number] Page 7 of 8 Medical Record Number: [id number] Patient Account Number: [id number] Date of Birth/Sex: Treating RN: Oct 28, 1960 (61 y.o. F) Primary Care Provider: Leanna Battles Other Clinician: Referring Provider: Treating Provider/Extender: Darrold Junker in Treatment: 11 Information Obtained From Patient Chart Cardiovascular Medical History: Positive for: Hypertension Gastrointestinal Medical History: Past Medical History Notes: GERD Endocrine Medical History: Past Medical History Notes: thyroid disease Immunizations Pneumococcal Vaccine: Received Pneumococcal Vaccination: No Implantable Devices None Family and Social History Unknown History: Yes; Current every day smoker; Marital Status - Married; Alcohol Use: Moderate; Drug Use: No History; Caffeine Use: Moderate; Financial Concerns: No; Food, Clothing or Shelter Needs: No; Support System Lacking: No; Transportation Concerns: No Electronic Signature(s) Signed: 05/04/2022 1:59:20 PM By: Kalman Shan DO Entered By: Kalman Shan on 05/04/2022  13:56:08 -------------------------------------------------------------------------------- SuperBill Details Patient Name: Date of Service: [name] [name] [name], [name] [name] LLISO [name] [name] [name]. 05/04/2022 Medical Record Number: [id number] Patient Account Number: [id number] Date of Birth/Sex: Treating RN: 04-02-1961 (61 y.o. [name] [name] [name], [name] [name] [name] Primary Care Provider: Leanna Battles Other Clinician: Referring Provider: Treating Provider/Extender: Darrold Junker in Treatment: 11 Diagnosis Coding ICD-10 Codes Code Description I37.048G Burn of second degree of right lower leg, initial encounter E03.9 Hypothyroidism, unspecified L03.115 Cellulitis of right lower limb L97.518 Non-pressure chronic ulcer of other part of right foot with other specified severity Facility Procedures : [name] [name] [name], [name] [name] [name] Code: [id number] [name] [name] [name] [phone] L Description: 00349 - DEB SUBQ TISSUE 20 SQ CM/< ICD-10 Diagnosis Description 227) [id number] 97.518 Non-pressure chronic ulcer of other part of right foot with other specified sev Modifier: [id number] erity Quantity: 1 [id number].pdf Page 8 of 8 Physician Procedures : CPT4 Code Description Modifier [id number] 11042 - WC PHYS SUBQ TISS 20 SQ CM ICD-10 Diagnosis Description L97.518 Non-pressure chronic ulcer of other part of right foot with other specified severity Quantity: 1 Electronic Signature(s) Signed: 05/04/2022 1:59:20 PM By: Kalman Shan DO Entered By: Kalman Shan on 05/04/2022 13:58:46

## 2022-05-13 ENCOUNTER — Encounter (HOSPITAL_BASED_OUTPATIENT_CLINIC_OR_DEPARTMENT_OTHER): Payer: 59 | Admitting: Internal Medicine

## 2022-05-20 ENCOUNTER — Encounter (HOSPITAL_BASED_OUTPATIENT_CLINIC_OR_DEPARTMENT_OTHER): Payer: 59 | Attending: Internal Medicine | Admitting: Internal Medicine

## 2022-05-20 DIAGNOSIS — E039 Hypothyroidism, unspecified: Secondary | ICD-10-CM | POA: Diagnosis not present

## 2022-05-20 DIAGNOSIS — L97518 Non-pressure chronic ulcer of other part of right foot with other specified severity: Secondary | ICD-10-CM

## 2022-05-20 DIAGNOSIS — Z09 Encounter for follow-up examination after completed treatment for conditions other than malignant neoplasm: Secondary | ICD-10-CM | POA: Diagnosis not present

## 2022-05-20 DIAGNOSIS — T24231A Burn of second degree of right lower leg, initial encounter: Secondary | ICD-10-CM

## 2022-05-20 DIAGNOSIS — Z872 Personal history of diseases of the skin and subcutaneous tissue: Secondary | ICD-10-CM | POA: Diagnosis not present

## 2022-05-21 DIAGNOSIS — R051 Acute cough: Secondary | ICD-10-CM | POA: Diagnosis not present

## 2022-05-21 DIAGNOSIS — R69 Illness, unspecified: Secondary | ICD-10-CM | POA: Diagnosis not present

## 2022-05-21 DIAGNOSIS — J019 Acute sinusitis, unspecified: Secondary | ICD-10-CM | POA: Diagnosis not present

## 2022-05-21 DIAGNOSIS — J40 Bronchitis, not specified as acute or chronic: Secondary | ICD-10-CM | POA: Diagnosis not present

## 2022-05-21 NOTE — Progress Notes (Signed)
RAIN, WILHIDE ([id number]) [id number] Page 1 of 8 Visit Report for 05/20/2022 Arrival Information Details Patient Name: Date of Service: [name] [name] 05/20/2022 2:45 PM Medical Record Number: [id number] Patient Account Number: [id number] Date of Birth/Sex: Treating RN: May 14, 1961 (62 y.o. [name], [name] Primary Care Mazelle Huebert: Leanna Battles Other Clinician: Referring Heba Ige: Treating Byard Carranza/Extender: Darrold Junker in Treatment: 13 Visit Information History Since Last Visit Added or deleted any medications: No Patient Arrived: Ambulatory Any new allergies or adverse reactions: No Arrival Time: 14:48 Had [name] fall or experienced change in No Accompanied By: self activities of daily living that may affect Transfer Assistance: None risk of falls: Patient Identification Verified: Yes Signs or symptoms of abuse/neglect since last visito No Secondary Verification Process Completed: Yes Hospitalized since last visit: No Patient Requires Transmission-Based Precautions: No Implantable device outside of the clinic excluding No Patient Has Alerts: No cellular tissue based products placed in the center since last visit: Has Dressing in Place as Prescribed: Yes Pain Present Now: No Electronic Signature(s) Signed: 05/21/2022 12:14:03 PM By: Rhae Hammock RN Entered By: Rhae Hammock on 05/20/2022 14:48:24 -------------------------------------------------------------------------------- Clinic Level of Care Assessment Details Patient Name: Date of Service: [name] [name], [name] [name]. 05/20/2022 2:45 PM Medical Record Number: [id number] Patient Account Number: [id number] Date of Birth/Sex: Treating RN: [birth year] (62 y.o. [name], [name] Primary Care Gaylia Kassel: Leanna Battles Other Clinician: Referring Cartel Mauss: Treating Amybeth Sieg/Extender: Darrold Junker in Treatment: 13 Clinic Level  of Care Assessment Items TOOL 4 Quantity Score X- 1 0 Use when only an EandM is performed on FOLLOW-UP visit ASSESSMENTS - Nursing Assessment / Reassessment X- 1 10 Reassessment of Co-morbidities (includes updates in patient status) X- 1 5 Reassessment of Adherence to Treatment Plan ASSESSMENTS - Wound and Skin [name] ssessment / Reassessment X - Simple Wound Assessment / Reassessment - one wound 1 5 '[]'$  - 0 Complex Wound Assessment / Reassessment - multiple wounds '[]'$  - 0 Dermatologic / Skin Assessment (not related to wound area) ASSESSMENTS - Focused Assessment X- 1 5 Circumferential Edema Measurements - multi extremities '[]'$  - 0 Nutritional Assessment / Counseling / Intervention EVOLEHT, HOVATTER ([id number]) [id number] Page 2 of 8 '[]'$  - 0 Lower Extremity Assessment (monofilament, tuning fork, pulses) '[]'$  - 0 Peripheral Arterial Disease Assessment (using hand held doppler) ASSESSMENTS - Ostomy and/or Continence Assessment and Care '[]'$  - 0 Incontinence Assessment and Management '[]'$  - 0 Ostomy Care Assessment and Management (repouching, etc.) PROCESS - Coordination of Care X - Simple Patient / Family Education for ongoing care 1 15 '[]'$  - 0 Complex (extensive) Patient / Family Education for ongoing care X- 1 10 Staff obtains Programmer, systems, Records, T Results / Process Orders est '[]'$  - 0 Staff telephones HHA, Nursing Homes / Clarify orders / etc '[]'$  - 0 Routine Transfer to another Facility (non-emergent condition) '[]'$  - 0 Routine Hospital Admission (non-emergent condition) '[]'$  - 0 New Admissions / Biomedical engineer / Ordering NPWT Apligraf, etc. , '[]'$  - 0 Emergency Hospital Admission (emergent condition) X- 1 10 Simple Discharge Coordination '[]'$  - 0 Complex (extensive) Discharge Coordination PROCESS - Special Needs '[]'$  - 0 Pediatric / Minor Patient Management '[]'$  - 0 Isolation Patient Management '[]'$  - 0 Hearing / Language / Visual special needs '[]'$  -  0 Assessment of Community assistance (transportation, D/C planning, etc.) '[]'$  - 0 Additional assistance / Altered mentation '[]'$  - 0 Support Surface(s) Assessment (bed, cushion, seat, etc.) INTERVENTIONS - Wound Cleansing / Measurement  X - Simple Wound Cleansing - one wound 1 5 '[]'$  - 0 Complex Wound Cleansing - multiple wounds X- 1 5 Wound Imaging (photographs - any number of wounds) '[]'$  - 0 Wound Tracing (instead of photographs) X- 1 5 Simple Wound Measurement - one wound '[]'$  - 0 Complex Wound Measurement - multiple wounds INTERVENTIONS - Wound Dressings X - Small Wound Dressing one or multiple wounds 1 10 '[]'$  - 0 Medium Wound Dressing one or multiple wounds '[]'$  - 0 Large Wound Dressing one or multiple wounds '[]'$  - 0 Application of Medications - topical '[]'$  - 0 Application of Medications - injection INTERVENTIONS - Miscellaneous '[]'$  - 0 External ear exam '[]'$  - 0 Specimen Collection (cultures, biopsies, blood, body fluids, etc.) '[]'$  - 0 Specimen(s) / Culture(s) sent or taken to Lab for analysis '[]'$  - 0 Patient Transfer (multiple staff / Civil Service fast streamer / Similar devices) '[]'$  - 0 Simple Staple / Suture removal (25 or less) '[]'$  - 0 Complex Staple / Suture removal (26 or more) '[]'$  - 0 Hypo / Hyperglycemic Management (close monitor of Blood Glucose) [name] [name], [name] [name] ([id number]) [id number] Page 3 of 8 '[]'$  - 0 Ankle / Brachial Index (ABI) - do not check if billed separately X- 1 5 Vital Signs Has the patient been seen at the hospital within the last three years: Yes Total Score: 90 Level Of Care: New/Established - Level 3 Electronic Signature(s) Signed: 05/21/2022 12:14:03 PM By: Rhae Hammock RN Entered By: Rhae Hammock on 05/20/2022 15:15:41 -------------------------------------------------------------------------------- Encounter Discharge Information Details Patient Name: Date of Service: [name] [name], [name] [name]. 05/20/2022 2:45 PM Medical Record  Number: [id number] Patient Account Number: [id number] Date of Birth/Sex: Treating RN: 10-28-1960 (62 y.o. [name], [name] Primary Care Brettany Sydney: Leanna Battles Other Clinician: Referring Herma Uballe: Treating Tanay Massiah/Extender: Darrold Junker in Treatment: 13 Encounter Discharge Information Items Discharge Condition: Stable Ambulatory Status: Ambulatory Discharge Destination: Home Transportation: Private Auto Accompanied By: self Schedule Follow-up Appointment: Yes Clinical Summary of Care: Patient Declined Electronic Signature(s) Signed: 05/21/2022 12:14:03 PM By: Rhae Hammock RN Entered By: Rhae Hammock on 05/20/2022 15:17:02 -------------------------------------------------------------------------------- Lower Extremity Assessment Details Patient Name: Date of Service: [name] [name], [name] [name]. 05/20/2022 2:45 PM Medical Record Number: [id number] Patient Account Number: [id number] Date of Birth/Sex: Treating RN: [birth year] (62 y.o. [name], [name] Primary Care Buffey Zabinski: Leanna Battles Other Clinician: Referring Yovany Clock: Treating Obediah Welles/Extender: Darrold Junker in Treatment: 13 Edema Assessment Assessed: Shirlyn Goltz: No] Patrice Paradise: Yes] Edema: [Left: N] [Right: o] Calf Left: Right: Point of Measurement: From Medial Instep 28 cm Ankle Left: Right: Point of Measurement: From Medial Instep 17.5 cm Vascular Assessment [name] [name], [name] [name] ([id number]) [Right:[id number] Page 4 of 8] Pulses: Dorsalis Pedis Palpable: [Right:Yes] Posterior Tibial Palpable: [Right:Yes] Electronic Signature(s) Signed: 05/21/2022 12:14:03 PM By: Rhae Hammock RN Entered By: Rhae Hammock on 05/20/2022 14:48:52 -------------------------------------------------------------------------------- Multi Wound Chart Details Patient Name: Date of Service: [name] [name], [name] LLISO Delane Ginger [name]. 05/20/2022 2:45 PM Medical Record  Number: [id number] Patient Account Number: [id number] Date of Birth/Sex: Treating RN: 1961-04-20 (62 y.o. F) Primary Care Kaesyn Johnston: Leanna Battles Other Clinician: Referring Mayes Sangiovanni: Treating Milka Windholz/Extender: Darrold Junker in Treatment: 13 Vital Signs Height(in): 62 Pulse(bpm): 74 Weight(lbs): 110 Blood Pressure(mmHg): 185/123 Body Mass Index(BMI): 20.1 Temperature(F): 98.8 Respiratory Rate(breaths/min): 17 [1:Photos:] [N/[name]:N/[name]] Right, Dorsal Foot N/[name] N/[name] Wound Location: Thermal Burn N/[name] N/[name] Wounding Event: 2nd degree Burn N/[name] N/[name] Primary Etiology: Hypertension N/[name] N/[name] Comorbid History: 02/02/2022 N/[name] N/[name] Date Acquired:  13 N/[name] N/[name] Weeks of Treatment: Open N/[name] N/[name] Wound Status: No N/[name] N/[name] Wound Recurrence: Yes N/[name] N/[name] Clustered Wound: 1 N/[name] N/[name] Clustered Quantity: 0x0x0 N/[name] N/[name] Measurements L x W x D (cm) 0 N/[name] N/[name] [name] (cm) : rea 0 N/[name] N/[name] Volume (cm) : 100.00% N/[name] N/[name] % Reduction in [name] rea: 100.00% N/[name] N/[name] % Reduction in Volume: Full Thickness With Exposed Support N/[name] N/[name] Classification: Structures Medium N/[name] N/[name] Exudate Amount: Serosanguineous N/[name] N/[name] Exudate Type: red, brown N/[name] N/[name] Exudate Color: Distinct, outline attached N/[name] N/[name] Wound Margin: Large (67-100%) N/[name] N/[name] Granulation Amount: Red, Pink N/[name] N/[name] Granulation Quality: Small (1-33%) N/[name] N/[name] Necrotic Amount: Fat Layer (Subcutaneous Tissue): Yes N/[name] N/[name] Exposed Structures: Fascia: No Tendon: No Muscle: No Joint: No Bone: No Large (67-100%) N/[name] N/[name] Epithelialization: Excoriation: No N/[name] N/[name] Periwound Skin Texture: Induration: No [name] [name], [name] [name] ([id number]) [phone].pdf Page 5 of 8 Callus: No Crepitus: No Rash: No Scarring: No Maceration: No N/[name] N/[name] Periwound Skin Moisture: Dry/Scaly: No Atrophie Blanche: No N/[name] N/[name] Periwound Skin Color: Cyanosis: No Ecchymosis: No Erythema: No Hemosiderin Staining: No Mottled:  No Pallor: No Rubor: No No Abnormality N/[name] N/[name] Temperature: Yes N/[name] N/[name] Tenderness on Palpation: Treatment Notes Electronic Signature(s) Signed: 05/20/2022 3:33:48 PM By: Kalman Shan DO Entered By: Kalman Shan on 05/20/2022 15:10:36 -------------------------------------------------------------------------------- Multi-Disciplinary Care Plan Details Patient Name: Date of Service: [name] [name], [name] LLISO Delane Ginger [name]. 05/20/2022 2:45 PM Medical Record Number: [id number] Patient Account Number: [id number] Date of Birth/Sex: Treating RN: [birth year] (62 y.o. [name], [name] Primary Care [name] [name]: Leanna Battles Other Clinician: Referring Anamarie Hunn: Treating Marvon Shillingburg/Extender: Darrold Junker in Treatment: 13 Active Inactive Electronic Signature(s) Signed: 05/21/2022 12:14:03 PM By: Rhae Hammock RN Entered By: Rhae Hammock on 05/20/2022 15:15:01 -------------------------------------------------------------------------------- Pain Assessment Details Patient Name: Date of Service: [name] [name], [name] LLISO Delane Ginger [name]. 05/20/2022 2:45 PM Medical Record Number: [id number] Patient Account Number: [id number] Date of Birth/Sex: Treating RN: [birth year] (62 y.o. [name], [name] Primary Care Sampson Self: Leanna Battles Other Clinician: Referring Lavonya Hoerner: Treating Yanique Mulvihill/Extender: Darrold Junker in Treatment: 13 Active Problems Location of Pain Severity and Description of Pain Patient Has Paino No Site Locations [name] [name] ([id number]) [id number] Page 6 of 8 Pain Management and Medication Current Pain Management: Electronic Signature(s) Signed: 05/21/2022 12:14:03 PM By: Rhae Hammock RN Entered By: Rhae Hammock on 05/20/2022 14:48:45 -------------------------------------------------------------------------------- Patient/Caregiver Education Details Patient Name: Date of Service: [name] [name], [name] 1/4/2024andnbsp2:45 PM Medical Record Number: [id number] Patient Account Number: [id number] Date of Birth/Gender: Treating RN: 09-Oct-1960 (62 y.o. [name], [name] Primary Care Physician: Leanna Battles Other Clinician: Referring Physician: Treating Physician/Extender: Darrold Junker in Treatment: 13 Education Assessment Education Provided To: Patient Education Topics Provided Wound/Skin Impairment: Methods: Explain/Verbal Responses: Reinforcements needed, State content correctly [employer]) Signed: 05/21/2022 12:14:03 PM By: Rhae Hammock RN Entered By: Rhae Hammock on 05/20/2022 15:15:11 -------------------------------------------------------------------------------- Wound Assessment Details Patient Name: Date of Service: [name] [name], [name] LLISO Delane Ginger [name]. 05/20/2022 2:45 PM Medical Record Number: [id number] Patient Account Number: [id number] Date of Birth/Sex: Treating RN: Jun 19, 1960 (62 y.o. [name], [name] Primary Care [name] [name]: Leanna Battles Other Clinician: Referring [name] [name]: Treating [name] [name]/Extender: [name] [name], [name] [name] ([id number]) [id number] Page 7 of 8 Weeks in Treatment: 13 Wound Status Wound Number: 1 Primary Etiology: 2nd degree Burn Wound Location: Right, Dorsal Foot Wound Status: Open Wounding Event: Thermal Burn Comorbid History: Hypertension Date Acquired: 02/02/2022 Weeks Of Treatment:  13 Clustered Wound: Yes Photos Wound Measurements Length: (cm) Width: (cm) Depth: (cm) Clustered Quantity: Area: (cm) Volume: (cm) 0 % Reduction in Area: 100% 0 % Reduction in Volume: 100% 0 Epithelialization: Large (67-100%) 1 0 0 Wound Description Classification: Full Thickness With Exposed Suppor Wound Margin: Distinct, outline attached Exudate Amount: Medium Exudate Type: Serosanguineous Exudate Color: red, brown t Structures Foul Odor  After Cleansing: No Slough/Fibrino Yes Wound Bed Granulation Amount: Large (67-100%) Exposed Structure Granulation Quality: Red, Pink Fascia Exposed: No Necrotic Amount: Small (1-33%) Fat Layer (Subcutaneous Tissue) Exposed: Yes Necrotic Quality: Adherent Slough Tendon Exposed: No Muscle Exposed: No Joint Exposed: No Bone Exposed: No Periwound Skin Texture Texture Color No Abnormalities Noted: Yes No Abnormalities Noted: Yes Moisture Temperature / Pain No Abnormalities Noted: Yes Temperature: No Abnormality Tenderness on Palpation: Yes Electronic Signature(s) Signed: 05/20/2022 4:16:52 PM By: Erenest Blank Signed: 05/21/2022 12:14:03 PM By: Rhae Hammock RN Entered By: Erenest Blank on 05/20/2022 14:55:47 -------------------------------------------------------------------------------- Vitals Details Patient Name: Date of Service: [name] [name], [name] LLISO Delane Ginger [name]. 05/20/2022 2:45 PM Medical Record Number: [id number] Patient Account Number: [id number] Date of Birth/Sex: Treating RN: [birth year] (62 y.o. [name] [name], [name] [name], [name] [name] ([id number]) [id number] Page 8 of 8 Primary Care Kaden Daughdrill: Leanna Battles Other Clinician: Referring Shekera Beavers: Treating Perry Brucato/Extender: Darrold Junker in Treatment: 13 Vital Signs Time Taken: 14:49 Temperature (F): 98.8 Height (in): 62 Pulse (bpm): 74 Weight (lbs): 110 Respiratory Rate (breaths/min): 17 Body Mass Index (BMI): 20.1 Blood Pressure (mmHg): 185/123 Reference Range: 80 - 120 mg / dl Electronic Signature(s) Signed: 05/21/2022 12:14:03 PM By: Rhae Hammock RN Entered By: Rhae Hammock on 05/20/2022 14:51:19

## 2022-05-21 NOTE — Progress Notes (Signed)
CAMERIN, JIMENEZ ([id number]) [id number] Page 1 of 6 Visit Report for 05/20/2022 Chief Complaint Document Details Patient Name: Date of Service: [name] [name] 05/20/2022 2:45 PM Medical Record Number: [id number] Patient Account Number: [id number] Date of Birth/Sex: Treating RN: 09/27/1960 (62 y.o. F) Primary Care Provider: Leanna Battles Other Clinician: Referring Provider: Treating Provider/Extender: Darrold Junker in Treatment: 13 Information Obtained from: Patient Chief Complaint 02/16/2022; burn to the right foot Electronic Signature(s) Signed: 05/20/2022 3:33:48 PM By: Kalman Shan DO Entered By: Kalman Shan on 05/20/2022 15:10:44 -------------------------------------------------------------------------------- HPI Details Patient Name: Date of Service: [name] [name], [name] LLISO [name] Ginger E. 05/20/2022 2:45 PM Medical Record Number: [id number] Patient Account Number: [id number] Date of Birth/Sex: Treating RN: Jul 27, 1960 (62 y.o. F) Primary Care Provider: Leanna Battles Other Clinician: Referring Provider: Treating Provider/Extender: Darrold Junker in Treatment: 13 History of Present Illness HPI Description: Admission 02/16/2022 [name] is [name] 62 year old female with [name] past medical history of hypertension and hypothyroidism that presents to the clinic for 2-week history of burn to her right foot. She states she was cooking mashed potatoes and they fell on her foot resulting in [name] burn. She states she visited her primary care physician and was started on Silvadene. She had [name] follow-up visit and was prescribed doxycycline and mupirocin. She denies signs of infection currently. She reports minimal improvement in wound healing. 10/10; patient presents for follow-up. She states she has been using Santyl to the wound bed daily. She states she has used the entire tube over the past week. She has  increased warmth and erythema to the right foot. She states she completed doxycycline last week. 10/17; patient presents for follow-up. She just received Iodosorb in the mail and started this yesterday. She states that she squeezed out the last little bit of Santyl from the tube and has been using this to the wound bed. She is currently taking the clindamycin as prescribed at last clinic visit. She reports improvement in her symptoms. She has been using Medihoney to the toe wound. She has no issues or complaints today. 10/31; patient presents for follow-up. She states she has been using Iodosorb to the wound bed and bacitracin to the toe wound. She has no issues or complaints today. 11/14; patient presents for follow-up. She has been using Iodosorb to the wound bed. The toe wound has healed. She has no issues or complaints today. 11/21; patient presents for follow-up. She has been using Medihoney and Hydrofera Blue to the wound bed. She has no issues or complaints today. 11/27; Patient presents for follow up. She has been using medihoney and hydrofera blue to the wound bed. She had [name] hard time getting the Tubigrip on and has not been using this. 12/4; patient presents for follow-up. We use antibiotic ointment with Hydrofera Blue under Kerlix/Coban at last clinic visit. She tolerated the compression wrap well. She has no issues or complaints today. 12/12; patient presents for follow-up. We have been using Hydrofera Blue with antibiotic ointment under Kerlix/Coban. She has no issues or complaints today. 12/19; patient presents for follow up. We have been using Hydrofera Blue under Kerlix/Coban. She has no issues or complaints today. There is been improvement in wound healing. [name], [name] ([id number]) [id number] Page 2 of 6 1/4; patient presents for follow-up. We have been using Hydrofera Blue under Kerlix/Coban. She missed her last clinic appointment and has been  using [facility] and keeping the area covered. She has  done well with this and the wound has healed. Electronic Signature(s) Signed: 05/20/2022 3:33:48 PM By: Kalman Shan DO Entered By: Kalman Shan on 05/20/2022 15:11:14 -------------------------------------------------------------------------------- Physical Exam Details Patient Name: Date of Service: [name] [name], [name] 05/20/2022 2:45 PM Medical Record Number: [id number] Patient Account Number: [id number] Date of Birth/Sex: Treating RN: Nov 19, 1960 (62 y.o. F) Primary Care Provider: Leanna Battles Other Clinician: Referring Provider: Treating Provider/Extender: Darrold Junker in Treatment: 13 Constitutional respirations regular, non-labored and within target range for patient.. Cardiovascular 2+ dorsalis pedis/posterior tibialis pulses. Psychiatric pleasant and cooperative. Notes Right foot: T the dorsal aspect there is epithelization and [name] small scab over the previous wound site. No drainage noted. No discernible open wound. o Electronic Signature(s) Signed: 05/20/2022 3:33:48 PM By: Kalman Shan DO Entered By: Kalman Shan on 05/20/2022 15:12:12 -------------------------------------------------------------------------------- Physician Orders Details Patient Name: Date of Service: [name] [name], [name] LLISO [name] Ginger E. 05/20/2022 2:45 PM Medical Record Number: [id number] Patient Account Number: [id number] Date of Birth/Sex: Treating RN: 06-24-1960 (63 y.o. F) Primary Care Provider: Leanna Battles Other Clinician: Referring Provider: Treating Provider/Extender: Darrold Junker in Treatment: [phone] Verbal / Phone Orders: No Diagnosis Coding ICD-10 Coding Code Description E99.371I Burn of second degree of right lower leg, initial encounter E03.9 Hypothyroidism, unspecified L03.115 Cellulitis of right lower limb L97.518 Non-pressure chronic ulcer of other part of right  foot with other specified severity Electronic Signature(s) Signed: 05/20/2022 3:33:48 PM By: Kalman Shan DO Entered By: Kalman Shan on 05/20/2022 15:12:19 Burgess Amor ([id number]) [id number] Page 3 of 6 -------------------------------------------------------------------------------- Problem List Details Patient Name: Date of Service: [name] [name] 05/20/2022 2:45 PM Medical Record Number: [id number] Patient Account Number: [id number] Date of Birth/Sex: Treating RN: 1961/02/18 (62 y.o. F) Primary Care Provider: Leanna Battles Other Clinician: Referring Provider: Treating Provider/Extender: Darrold Junker in Treatment: 13 Active Problems ICD-10 Encounter Code Description Active Date MDM Diagnosis T24.231A Burn of second degree of right lower leg, initial encounter 02/16/2022 No Yes E03.9 Hypothyroidism, unspecified 02/16/2022 No Yes L03.115 Cellulitis of right lower limb 02/23/2022 No Yes L97.518 Non-pressure chronic ulcer of other part of right foot with other specified 03/30/2022 No Yes severity Inactive Problems Resolved Problems Electronic Signature(s) Signed: 05/20/2022 3:33:48 PM By: Kalman Shan DO Entered By: Kalman Shan on 05/20/2022 15:09:57 -------------------------------------------------------------------------------- Progress Note Details Patient Name: Date of Service: [name] [name], [name] LLISO [name] Ginger E. 05/20/2022 2:45 PM Medical Record Number: [id number] Patient Account Number: [id number] Date of Birth/Sex: Treating RN: 02-03-1961 (62 y.o. F) Primary Care Provider: Leanna Battles Other Clinician: Referring Provider: Treating Provider/Extender: Darrold Junker in Treatment: 13 Subjective Chief Complaint Information obtained from Patient 02/16/2022; burn to the right foot History of Present Illness (HPI) Admission 02/16/2022 [name] is [name] 62 year old  female with [name] past medical history of hypertension and hypothyroidism that presents to the clinic for 2-week history of burn to her right foot. She states she was cooking mashed potatoes and they fell on her foot resulting in [name] burn. She states she visited her primary care DENZIL, BRISTOL ([id number]) [phone].pdf Page 4 of 6 physician and was started on Silvadene. She had [name] follow-up visit and was prescribed doxycycline and mupirocin. She denies signs of infection currently. She reports minimal improvement in wound healing. 10/10; patient presents for follow-up. She states she has been using Santyl to the wound bed daily. She states she has used the entire tube over the  past week. She has increased warmth and erythema to the right foot. She states she completed doxycycline last week. 10/17; patient presents for follow-up. She just received Iodosorb in the mail and started this yesterday. She states that she squeezed out the last little bit of Santyl from the tube and has been using this to the wound bed. She is currently taking the clindamycin as prescribed at last clinic visit. She reports improvement in her symptoms. She has been using Medihoney to the toe wound. She has no issues or complaints today. 10/31; patient presents for follow-up. She states she has been using Iodosorb to the wound bed and bacitracin to the toe wound. She has no issues or complaints today. 11/14; patient presents for follow-up. She has been using Iodosorb to the wound bed. The toe wound has healed. She has no issues or complaints today. 11/21; patient presents for follow-up. She has been using Medihoney and Hydrofera Blue to the wound bed. She has no issues or complaints today. 11/27; Patient presents for follow up. She has been using medihoney and hydrofera blue to the wound bed. She had [name] hard time getting the Tubigrip on and has not been using this. 12/4; patient presents for follow-up. We  use antibiotic ointment with Hydrofera Blue under Kerlix/Coban at last clinic visit. She tolerated the compression wrap well. She has no issues or complaints today. 12/12; patient presents for follow-up. We have been using Hydrofera Blue with antibiotic ointment under Kerlix/Coban. She has no issues or complaints today. 12/19; patient presents for follow up. We have been using Hydrofera Blue under Kerlix/Coban. She has no issues or complaints today. There is been improvement in wound healing. 1/4; patient presents for follow-up. We have been using Hydrofera Blue under Kerlix/Coban. She missed her last clinic appointment and has been using [facility] and keeping the area covered. She has done well with this and the wound has healed. Patient History Information obtained from Patient, Chart. Family History Unknown History. Social History Current every day smoker, Marital Status - Married, Alcohol Use - Moderate, Drug Use - No History, Caffeine Use - Moderate. Medical History Cardiovascular Patient has history of Hypertension Medical [name] Surgical History Notes nd Gastrointestinal GERD Endocrine thyroid disease Objective Constitutional respirations regular, non-labored and within target range for patient.. Vitals Time Taken: 2:49 PM, Height: 62 in, Weight: 110 lbs, BMI: 20.1, Temperature: 98.8 F, Pulse: 74 bpm, Respiratory Rate: 17 breaths/min, Blood Pressure: 185/123 mmHg. Cardiovascular 2+ dorsalis pedis/posterior tibialis pulses. Psychiatric pleasant and cooperative. General Notes: Right foot: T the dorsal aspect there is epithelization and [name] small scab over the previous wound site. No drainage noted. No discernible open o wound. Integumentary (Hair, Skin) Wound #1 status is Open. Original cause of wound was Thermal Burn. The date acquired was: 02/02/2022. The wound has been in treatment 13 weeks. The wound is located on the Right,Dorsal Foot. The wound measures 0cm length x 0cm  width x 0cm depth; 0cm^2 area and 0cm^3 volume. There is Fat Layer (Subcutaneous Tissue) exposed. There is [name] medium amount of serosanguineous drainage noted. The wound margin is distinct with the outline attached to the wound base. There is large (67-100%) red, pink granulation within the wound bed. There is [name] small (1-33%) amount of necrotic tissue within the wound bed including Adherent Slough. The periwound skin appearance had no abnormalities noted for texture. The periwound skin appearance had no abnormalities noted for moisture. The periwound skin appearance had no abnormalities noted for color. Periwound temperature was  noted as No Abnormality. The periwound has tenderness on palpation. ADAIJAH, ENDRES ([id number]) [id number] Page 5 of 6 Assessment Active Problems ICD-10 Burn of second degree of right lower leg, initial encounter Hypothyroidism, unspecified Cellulitis of right lower limb Non-pressure chronic ulcer of other part of right foot with other specified severity Patient has done well with Hydrofera Blue. Her wound is healed. I recommended keeping the area protected daily and using her surgical shoe for 1-2 week. She may follow-up as needed. Plan 1. Discharge from clinic due to closed wound 2. Follow-up as needed 3. Keep area protected daily and wear surgical shoe for the next 1 to 2 weeks Electronic Signature(s) Signed: 05/20/2022 3:33:48 PM By: Kalman Shan DO Entered By: Kalman Shan on 05/20/2022 15:14:19 -------------------------------------------------------------------------------- HxROS Details Patient Name: Date of Service: [name] [name], [name] LLISO [name] Ginger E. 05/20/2022 2:45 PM Medical Record Number: [id number] Patient Account Number: [id number] Date of Birth/Sex: Treating RN: 1960-09-12 (62 y.o. F) Primary Care Provider: Leanna Battles Other Clinician: Referring Provider: Treating Provider/Extender: Darrold Junker in Treatment: 13 Information Obtained From Patient Chart Cardiovascular Medical History: Positive for: Hypertension Gastrointestinal Medical History: Past Medical History Notes: GERD Endocrine Medical History: Past Medical History Notes: thyroid disease Immunizations Pneumococcal Vaccine: Received Pneumococcal Vaccination: No Implantable Devices None Family and Social History AMARANTA, MEHL ([id number]) [phone].pdf Page 6 of 6 Unknown History: Yes; Current every day smoker; Marital Status - Married; Alcohol Use: Moderate; Drug Use: No History; Caffeine Use: Moderate; Financial Concerns: No; Food, Clothing or Shelter Needs: No; Support System Lacking: No; Transportation Concerns: No Electronic Signature(s) Signed: 05/20/2022 3:33:48 PM By: Kalman Shan DO Entered By: Kalman Shan on 05/20/2022 15:11:20 -------------------------------------------------------------------------------- SuperBill Details Patient Name: Date of Service: [name] [name], [name] LLISO [name] Ginger E. 05/20/2022 Medical Record Number: [id number] Patient Account Number: [id number] Date of Birth/Sex: Treating RN: Oct 18, 1960 (62 y.o. F) Primary Care Provider: Leanna Battles Other Clinician: Referring Provider: Treating Provider/Extender: Darrold Junker in Treatment: 13 Diagnosis Coding ICD-10 Codes Code Description B33.832N Burn of second degree of right lower leg, initial encounter E03.9 Hypothyroidism, unspecified L03.115 Cellulitis of right lower limb L97.518 Non-pressure chronic ulcer of other part of right foot with other specified severity Facility Procedures : CPT4 Code: 19166060 Description: 99213 - WOUND CARE VISIT-LEV 3 EST PT Modifier: Quantity: 1 Physician Procedures : CPT4 Code Description Modifier [id number] 99213 - WC PHYS LEVEL 3 - EST PT ICD-10 Diagnosis Description T24.231A Burn of second degree of right lower leg, initial  encounter L97.518 Non-pressure chronic ulcer of other part of right foot with other  specified severity E03.9 Hypothyroidism, unspecified Quantity: 1 Electronic Signature(s) Signed: 05/20/2022 3:33:48 PM By: Kalman Shan DO Signed: 05/21/2022 12:14:03 PM By: Rhae Hammock RN Entered By: Rhae Hammock on 05/20/2022 15:15:47

## 2022-05-26 ENCOUNTER — Ambulatory Visit
Admission: RE | Admit: 2022-05-26 | Discharge: 2022-05-26 | Disposition: A | Discharge status: Home / Self Care | Payer: 59 | Source: Ambulatory Visit | Attending: Obstetrics and Gynecology | Admitting: Obstetrics and Gynecology

## 2022-05-26 DIAGNOSIS — Z1231 Encounter for screening mammogram for malignant neoplasm of breast: Secondary | ICD-10-CM

## 2022-06-17 DIAGNOSIS — I700 Atherosclerosis of aorta: Secondary | ICD-10-CM | POA: Diagnosis not present

## 2022-06-17 DIAGNOSIS — I10 Essential (primary) hypertension: Secondary | ICD-10-CM | POA: Diagnosis not present

## 2022-06-17 DIAGNOSIS — K625 Hemorrhage of anus and rectum: Secondary | ICD-10-CM | POA: Diagnosis not present

## 2022-06-17 DIAGNOSIS — R69 Illness, unspecified: Secondary | ICD-10-CM | POA: Diagnosis not present

## 2022-06-17 DIAGNOSIS — E785 Hyperlipidemia, unspecified: Secondary | ICD-10-CM | POA: Diagnosis not present

## 2022-06-17 DIAGNOSIS — E039 Hypothyroidism, unspecified: Secondary | ICD-10-CM | POA: Diagnosis not present

## 2022-08-02 DIAGNOSIS — Z01419 Encounter for gynecological examination (general) (routine) without abnormal findings: Secondary | ICD-10-CM | POA: Diagnosis not present

## 2022-08-02 DIAGNOSIS — Z6821 Body mass index (BMI) 21.0-21.9, adult: Secondary | ICD-10-CM | POA: Diagnosis not present

## 2022-08-03 ENCOUNTER — Other Ambulatory Visit: Payer: Self-pay | Admitting: Obstetrics and Gynecology

## 2022-08-03 DIAGNOSIS — F17200 Nicotine dependence, unspecified, uncomplicated: Secondary | ICD-10-CM

## 2022-11-04 ENCOUNTER — Ambulatory Visit
Admission: RE | Admit: 2022-11-04 | Discharge: 2022-11-04 | Disposition: A | Discharge status: Home / Self Care | Payer: 59 | Source: Ambulatory Visit | Attending: Obstetrics and Gynecology | Admitting: Obstetrics and Gynecology

## 2022-11-04 DIAGNOSIS — F17210 Nicotine dependence, cigarettes, uncomplicated: Secondary | ICD-10-CM | POA: Diagnosis not present

## 2022-11-04 DIAGNOSIS — F17200 Nicotine dependence, unspecified, uncomplicated: Secondary | ICD-10-CM

## 2022-12-21 ENCOUNTER — Encounter: Payer: Self-pay | Admitting: Internal Medicine

## 2022-12-21 ENCOUNTER — Ambulatory Visit: Payer: 59 | Attending: Internal Medicine | Admitting: Internal Medicine

## 2022-12-21 VITALS — BP 158/88 | HR 76 | Ht 62.0 in | Wt 116.4 lb

## 2022-12-21 DIAGNOSIS — I2510 Atherosclerotic heart disease of native coronary artery without angina pectoris: Secondary | ICD-10-CM | POA: Diagnosis not present

## 2022-12-21 DIAGNOSIS — Z720 Tobacco use: Secondary | ICD-10-CM | POA: Diagnosis not present

## 2022-12-21 DIAGNOSIS — I700 Atherosclerosis of aorta: Secondary | ICD-10-CM

## 2022-12-21 DIAGNOSIS — I10 Essential (primary) hypertension: Secondary | ICD-10-CM

## 2022-12-21 DIAGNOSIS — E782 Mixed hyperlipidemia: Secondary | ICD-10-CM | POA: Diagnosis not present

## 2022-12-21 NOTE — Progress Notes (Signed)
OFFICE CONSULT NOTE  Chief Complaint:  Coronary artery calcification  Primary Care Physician: Garlan Fillers, MD  HPI:  [name] [name] is a 62 y.o. female who is being seen today for the evaluation of coronary artery calcification at the request of [name], MD. this is a pleasant 62 year old female kindly referred by Dr. Arelia Sneddon for a evaluation of coronary calcium.  She is a longtime smoker but recently has been cutting back.  She underwent a screening CT scan which showed aortic atherosclerosis and multivessel coronary artery calcification including left main calcium.  Today she says she is asymptomatic denying chest pain, worsening shortness of breath or associated symptoms.  She does have some fatigue however this is somewhat chronic.  She has a history of hypertension and blood pressure was elevated today however she says she has some whitecoat symptoms.  She is on medication for this.  She also was started on rosuvastatin for dyslipidemia by her PCP back in October.  Labs at that time showed total cholesterol 284, HDL 118, triglycerides 58 and LDL 154.  She was placed on rosuvastatin 20 mg daily which she said she took for several months however then she transferred her prescriptions to a different pharmacy and subsequently realized that she has not been taking the statin for several months.  She got her prescription filled yesterday and took her first dose today.  There is heart disease in the family including her father who had coronary artery disease.  PMHx:  Past Medical History:  Diagnosis Date   Concussion    Dizziness    GERD (gastroesophageal reflux disease)    Hypertension    Thyroid disease     Past Surgical History:  Procedure Laterality Date   CESAREAN SECTION N/A 1998   DIAGNOSTIC LAPAROSCOPY N/A    to eval endometriosis    FAMHx:  Family History  Problem Relation Age of Onset   Breast cancer Mother    Rheum arthritis Mother    Cancer Brother 50        mult myeloma   Cancer Father        gum   Heart failure Father    Kidney disease Father    Stomach cancer Paternal Grandmother 77   Colon cancer Neg Hx    Esophageal cancer Neg Hx     SOCHx:   reports that she has been smoking cigarettes. She has a 5 pack-year smoking history. She has never used smokeless tobacco. She reports current alcohol use of about 7.0 standard drinks of alcohol per week. She reports that she does not use drugs.  ALLERGIES:  Allergies  Allergen Reactions   Codeine Rash    halucinations    ROS: Pertinent items noted in HPI and remainder of comprehensive ROS otherwise negative.  HOME MEDS: Current Outpatient Medications on File Prior to Visit  Medication Sig Dispense Refill   albuterol (VENTOLIN HFA) 108 (90 Base) MCG/ACT inhaler Inhale 2 puffs into the lungs as needed.     ALPRAZolam (XANAX) 0.5 MG tablet Take 0.5 mg by mouth 2 (two) times daily as needed for sleep or anxiety.     levothyroxine (SYNTHROID) 88 MCG tablet Take 88 mcg by mouth daily before breakfast.     losartan (COZAAR) 50 MG tablet Take by mouth.      meloxicam (MOBIC) 15 MG tablet Take 15 mg by mouth daily.     metoprolol tartrate (LOPRESSOR) 25 MG tablet Take 50 mg by mouth daily.  NON FORMULARY Salicylic acid 20% Urea 40% cream Sig: apply to affected area once or twice daily and cover as directed 30gm Refill Prn     olmesartan (BENICAR) 20 MG tablet 20 mg. daily     omeprazole (PRILOSEC) 40 MG capsule Take 1 capsule (40 mg total) by mouth daily. OFFICE VISIT DUE FOR FURTHER REFILLS 40 capsule 0   rosuvastatin (CRESTOR) 20 MG tablet 20 mg daily.     Simethicone (GAS-X PO) Take by mouth in the morning and at bedtime.     Vitamin D, Ergocalciferol, (DRISDOL) 50000 units CAPS capsule Take 50,000 Units by mouth every 7 (seven) days.     alendronate (FOSAMAX) 70 MG tablet Take 70 mg by mouth once a week. (Patient not taking: Reported on 12/21/2022)     hyoscyamine (LEVSIN SL) 0.125  MG SL tablet Place 0.125 mg under the tongue every 6 (six) hours as needed.  (Patient not taking: Reported on 12/21/2022)     meclizine (ANTIVERT) 25 MG tablet Take 25 mg by mouth 3 (three) times daily as needed for dizziness. (Patient not taking: Reported on 12/21/2022)     No current facility-administered medications on file prior to visit.    LABS/IMAGING: No results found for this or any previous visit (from the past 48 hour(s)). No results found.  LIPID PANEL: No results found for: "CHOL", "TRIG", "HDL", "CHOLHDL", "VLDL", "LDLCALC", "LDLDIRECT"  WEIGHTS: Wt Readings from Last 3 Encounters:  12/21/22 116 lb 6.4 oz (52.8 kg)  07/24/21 115 lb (52.2 kg)  11/29/19 102 lb (46.3 kg)    VITALS: BP (!) 158/88 (BP Location: Right Arm, Patient Position: Sitting, Cuff Size: Normal)   Pulse 76   Ht 5\' 2"  (1.575 m)   Wt 116 lb 6.4 oz (52.8 kg)   SpO2 95%   BMI 21.29 kg/m   EXAM: General appearance: alert and no distress Neck: no carotid bruit, no JVD, and thyroid not enlarged, symmetric, no tenderness/mass/nodules Lungs: clear to auscultation bilaterally Heart: regular rate and rhythm Abdomen: soft, non-tender; bowel sounds normal; no masses,  no organomegaly Extremities: extremities normal, atraumatic, no cyanosis or edema Pulses: 2+ and symmetric Skin: Skin color, texture, turgor normal. No rashes or lesions Neurologic: Grossly normal Psych: Pleasant  EKG: EKG Interpretation Date/Time:  Tuesday December 21 2022 09:09:27 EDT Ventricular Rate:  76 PR Interval:  142 QRS Duration:  76 QT Interval:  388 QTC Calculation: 436 R Axis:   -35  Text Interpretation: Sinus rhythm with Premature atrial complexes Left axis deviation Nonspecific ST abnormality No previous ECGs available Confirmed by Zoila Shutter [id number]) on 12/21/2022 9:15:31 AM   ASSESSMENT: Multivessel coronary artery calcification Aortic atherosclerosis Hepatic steatosis Hypertension Tobacco use  PLAN: 1.   Mrs.  [name] has multivessel coronary artery calcification noted on her CT scan.  She has cardiovascular risk factors including dyslipidemia, tobacco use and hypertension as well as family history of heart disease in her father.  It seems that she is asymptomatic at this time.  EKG showed sinus rhythm with PACs.  She had inadvertently run out of her statin and just started taking it yesterday.  She will need repeat lipids but not until about 3 months on therapy.  Will check an NMR and LP(a) at that time.  I have encouraged her to continue to work on smoking cessation.  Blood pressure was elevated today.  She feels this is a whitecoat phenomenon.  I advised her to take some blood pressure readings at home and to notify  us with those readings.  Plan follow-up with me afterwards.  Thanks for the kind referral.  Chrystie Nose, MD, [facility]  [place]  [facility]  Medical Director of the [facility] Lipid [facility] &  Cardiovascular Risk Reduction Clinic Diplomate of the American Board of Clinical Lipidology Attending Cardiologist  Direct Dial: [phone]  Fax: [phone]  Website:  [email or web address]  Lisette Abu Rosalie Buenaventura 12/21/2022, 9:15 AM

## 2022-12-21 NOTE — Patient Instructions (Addendum)
Medication Instructions:  No changes *If you need a refill on your cardiac medications before your next appointment, please call your pharmacy*   Lab Work: NMR and LPA in 3 months If you have labs (blood work) drawn today and your tests are completely normal, you will receive your results only by: MyChart Message (if you have MyChart) OR A paper copy in the mail If you have any lab test that is abnormal or we need to change your treatment, we will call you to review the results.   Testing/Procedures: No Testing   Follow-Up: At [facility], you and your health needs are our priority.  As part of our continuing mission to provide you with exceptional heart care, we have created designated Provider Care Teams.  These Care Teams include your primary Cardiologist (physician) and Advanced Practice Providers (APPs -  Physician Assistants and Nurse Practitioners) who all work together to provide you with the care you need, when you need it.  We recommend signing up for the patient portal called "MyChart".  Sign up information is provided on this After Visit Summary.  MyChart is used to connect with patients for Virtual Visits (Telemedicine).  Patients are able to view lab/test results, encounter notes, upcoming appointments, etc.  Non-urgent messages can be sent to your provider as well.   To learn more about what you can do with MyChart, go to [email or web address].    Your next appointment:   3-4 month(s)  Provider:   Chrystie Nose, MD     Other Instructions  HOW TO TAKE YOUR BLOOD PRESSURE: Rest 5 minutes before taking your blood pressure. Don't smoke or drink caffeinated beverages for at least 30 minutes before. Take your blood pressure before (not after) you eat. Sit comfortably with your back supported and both feet on the floor (don't cross your legs). Elevate your arm to heart level on a table or a desk. Use the proper sized cuff. It should fit smoothly and snugly  around your bare upper arm. There should be enough room to slip a fingertip under the cuff. The bottom edge of the cuff should be 1 inch above the crease of the elbow. Ideally, take 3 measurements at one sitting and record the average.

## 2023-01-03 DIAGNOSIS — E785 Hyperlipidemia, unspecified: Secondary | ICD-10-CM | POA: Diagnosis not present

## 2023-01-03 DIAGNOSIS — E039 Hypothyroidism, unspecified: Secondary | ICD-10-CM | POA: Diagnosis not present

## 2023-01-03 DIAGNOSIS — F17210 Nicotine dependence, cigarettes, uncomplicated: Secondary | ICD-10-CM | POA: Diagnosis not present

## 2023-01-03 DIAGNOSIS — I2510 Atherosclerotic heart disease of native coronary artery without angina pectoris: Secondary | ICD-10-CM | POA: Diagnosis not present

## 2023-01-03 DIAGNOSIS — K760 Fatty (change of) liver, not elsewhere classified: Secondary | ICD-10-CM | POA: Diagnosis not present

## 2023-01-03 DIAGNOSIS — F419 Anxiety disorder, unspecified: Secondary | ICD-10-CM | POA: Diagnosis not present

## 2023-01-03 DIAGNOSIS — I10 Essential (primary) hypertension: Secondary | ICD-10-CM | POA: Diagnosis not present

## 2023-04-19 LAB — NMR, LIPOPROFILE
Cholesterol, Total: 208 mg/dL — ABNORMAL HIGH (ref 100–199)
HDL Particle Number: 41.8 umol/L (ref >=30.5)
HDL-C: 86 mg/dL (ref >39)
LDL Particle Number: 811 nmol/L (ref <1000)
LDL Size: 21.2 nmol (ref >20.5)
LDL-C (NIH Calc): 108 mg/dL — ABNORMAL HIGH (ref 0–99)
LP-IR Score: <25 (ref <=45)
Small LDL Particle Number: <90 nmol/L (ref <=527)
Triglycerides: 78 mg/dL (ref 0–149)

## 2023-04-19 LAB — LIPOPROTEIN A (LPA): Lipoprotein (a): <8.4 nmol/L (ref <75.0)

## 2023-04-21 ENCOUNTER — Encounter: Payer: Self-pay | Admitting: Internal Medicine

## 2023-04-21 ENCOUNTER — Ambulatory Visit: Payer: 59 | Attending: Internal Medicine | Admitting: Internal Medicine

## 2023-04-21 VITALS — BP 147/79 | HR 80 | Ht 62.0 in | Wt 117.0 lb

## 2023-04-21 DIAGNOSIS — E782 Mixed hyperlipidemia: Secondary | ICD-10-CM | POA: Diagnosis not present

## 2023-04-21 DIAGNOSIS — I2510 Atherosclerotic heart disease of native coronary artery without angina pectoris: Secondary | ICD-10-CM | POA: Diagnosis not present

## 2023-04-21 DIAGNOSIS — I10 Essential (primary) hypertension: Secondary | ICD-10-CM | POA: Diagnosis not present

## 2023-04-21 DIAGNOSIS — I700 Atherosclerosis of aorta: Secondary | ICD-10-CM

## 2023-04-21 NOTE — Patient Instructions (Signed)
Medication Instructions:  NO CHANGES  Please message our office about your metoprolol -- is this metoprolol tartrate or metoprolol succinate  *If you need a refill on your cardiac medications before your next appointment, please call your pharmacy*   Lab Work: FASTING NMR lipoprofile and liver function test in 3 months  If you have labs (blood work) drawn today and your tests are completely normal, you will receive your results only by: MyChart Message (if you have MyChart) OR A paper copy in the mail If you have any lab test that is abnormal or we need to change your treatment, we will call you to review the results.    Follow-Up: At [facility], you and your health needs are our priority.  As part of our continuing mission to provide you with exceptional heart care, we have created designated Provider Care Teams.  These Care Teams include your primary Cardiologist (physician) and Advanced Practice Providers (APPs -  Physician Assistants and Nurse Practitioners) who all work together to provide you with the care you need, when you need it.  We recommend signing up for the patient portal called "MyChart".  Sign up information is provided on this After Visit Summary.  MyChart is used to connect with patients for Virtual Visits (Telemedicine).  Patients are able to view lab/test results, encounter notes, upcoming appointments, etc.  Non-urgent messages can be sent to your provider as well.   To learn more about what you can do with MyChart, go to [email or web address].    Your next appointment:    3-4 months with Dr. Rennis Golden or Eligha Bridegroom, NP

## 2023-04-21 NOTE — Progress Notes (Signed)
OFFICE CONSULT NOTE  Chief Complaint:  Coronary artery calcification  Primary Care Physician: Garlan Fillers, MD  HPI:  [name] [name] is a 62 y.o. female who is being seen today for the evaluation of coronary artery calcification at the request of Garlan Fillers, MD. this is a pleasant 62 year old female kindly referred by Dr. Arelia Sneddon for a evaluation of coronary calcium.  She is a longtime smoker but recently has been cutting back.  She underwent a screening CT scan which showed aortic atherosclerosis and multivessel coronary artery calcification including left main calcium.  Today she says she is asymptomatic denying chest pain, worsening shortness of breath or associated symptoms.  She does have some fatigue however this is somewhat chronic.  She has a history of hypertension and blood pressure was elevated today however she says she has some whitecoat symptoms.  She is on medication for this.  She also was started on rosuvastatin for dyslipidemia by her PCP back in October.  Labs at that time showed total cholesterol 284, HDL 118, triglycerides 58 and LDL 154.  She was placed on rosuvastatin 20 mg daily which she said she took for several months however then she transferred her prescriptions to a different pharmacy and subsequently realized that she has not been taking the statin for several months.  She got her prescription filled yesterday and took her first dose today.  There is heart disease in the family including her father who had coronary artery disease.  04/21/2023  [name] [name] returns today for follow-up.  Overall she is doing well.  She had repeat lipid testing.  Fortunately LP(a) was negative.  Her lipid profile is further improved with an LDL particle number of 811, LDL 108, HDL 86 and triglycerides 78.  Small LDL particle was normal at less than 90 nmol/L.  She is tolerating therapy with rosuvastatin and has no issues with the medication.  PMHx:  Past Medical  History:  Diagnosis Date   Concussion    Dizziness    GERD (gastroesophageal reflux disease)    Hypertension    Thyroid disease     Past Surgical History:  Procedure Laterality Date   CESAREAN SECTION N/A 1998   DIAGNOSTIC LAPAROSCOPY N/A    to eval endometriosis    FAMHx:  Family History  Problem Relation Age of Onset   Breast cancer Mother    Rheum arthritis Mother    Cancer Brother 50       mult myeloma   Cancer Father        gum   Heart failure Father    Kidney disease Father    Stomach cancer Paternal Grandmother 69   Colon cancer Neg Hx    Esophageal cancer Neg Hx     SOCHx:   reports that she has been smoking cigarettes. She has a 5 pack-year smoking history. She has never used smokeless tobacco. She reports current alcohol use of about 7.0 standard drinks of alcohol per week. She reports that she does not use drugs.  ALLERGIES:  Allergies  Allergen Reactions   Codeine Rash    halucinations    ROS: Pertinent items noted in HPI and remainder of comprehensive ROS otherwise negative.  HOME MEDS: Current Outpatient Medications on File Prior to Visit  Medication Sig Dispense Refill   albuterol (VENTOLIN HFA) 108 (90 Base) MCG/ACT inhaler Inhale 2 puffs into the lungs as needed.     alendronate (FOSAMAX) 70 MG tablet Take 70 mg by mouth  once a week.     ALPRAZolam (XANAX) 0.5 MG tablet Take 0.5 mg by mouth 2 (two) times daily as needed for sleep or anxiety.     hyoscyamine (LEVSIN SL) 0.125 MG SL tablet Place 0.125 mg under the tongue every 6 (six) hours as needed.     levothyroxine (SYNTHROID) 88 MCG tablet Take 88 mcg by mouth daily before breakfast.     meclizine (ANTIVERT) 25 MG tablet Take 25 mg by mouth 3 (three) times daily as needed for dizziness.     meloxicam (MOBIC) 15 MG tablet Take 15 mg by mouth daily.     metoprolol tartrate (LOPRESSOR) 25 MG tablet Take 50 mg by mouth daily.      NON FORMULARY Salicylic acid 20% Urea 40% cream Sig: apply to  affected area once or twice daily and cover as directed 30gm Refill Prn     olmesartan (BENICAR) 20 MG tablet 20 mg. daily     omeprazole (PRILOSEC) 40 MG capsule Take 1 capsule (40 mg total) by mouth daily. OFFICE VISIT DUE FOR FURTHER REFILLS 40 capsule 0   rosuvastatin (CRESTOR) 20 MG tablet 20 mg daily.     Simethicone (GAS-X PO) Take by mouth in the morning and at bedtime.     Vitamin D, Ergocalciferol, (DRISDOL) 50000 units CAPS capsule Take 50,000 Units by mouth every 7 (seven) days.     No current facility-administered medications on file prior to visit.    LABS/IMAGING: No results found for this or any previous visit (from the past 48 hour(s)). No results found.  LIPID PANEL: No results found for: "CHOL", "TRIG", "HDL", "CHOLHDL", "VLDL", "LDLCALC", "LDLDIRECT"  WEIGHTS: Wt Readings from Last 3 Encounters:  04/21/23 117 lb (53.1 kg)  12/21/22 116 lb 6.4 oz (52.8 kg)  07/24/21 115 lb (52.2 kg)    VITALS: BP (!) 147/79 (BP Location: Left Arm, Patient Position: Sitting, Cuff Size: Normal)   Pulse 80   Ht 5\' 2"  (1.575 m)   Wt 117 lb (53.1 kg)   SpO2 98%   BMI 21.40 kg/m   EXAM: Deferred  EKG: Deferred  ASSESSMENT: Multivessel coronary artery calcification (Seen on chest CT) Aortic atherosclerosis Hepatic steatosis Hypertension Tobacco use  PLAN: 1.   [name] [name] significant improvement in her lipids on high dose rosuvastatin.  She seems to be tolerating this well.  At this point I think she is reasonably well treated although LDL is elevated her particle numbers are low.  LP(a) is negative.  I would not recommend any changes to her medicines at this time.  Would continue to work on diet and lifestyle modifications.  Will plan to repeat lipid NMR and reassessment of her liver enzymes in about 3 months and follow-up with me or APP at that time.  Chrystie Nose, MD, [facility], FACP  [place]  [facility]  Medical Director of the Advanced Lipid Disorders &   Cardiovascular Risk Reduction Clinic Diplomate of the American Board of Clinical Lipidology Attending Cardiologist  Direct Dial: [phone]  Fax: [phone]  Website:  [name] 04/21/2023, 5:09 PM

## 2023-05-05 ENCOUNTER — Telehealth: Payer: Self-pay | Admitting: Internal Medicine

## 2023-05-05 NOTE — Telephone Encounter (Signed)
-----   Message from Nurse Eileen Stanford E sent at 04/21/2023  5:24 PM EST ----- Regarding: needs 3-4 month f/u -- lipid [facility] Peers  This patient needs a lipid f/u with Hilty or Marcelino Duster in 3-4 months  Thanks

## 2023-05-05 NOTE — Telephone Encounter (Signed)
LVM to schedule lipid f/u.  Patient also called on 12/16 & 12/10.

## 2023-07-28 DIAGNOSIS — I2510 Atherosclerotic heart disease of native coronary artery without angina pectoris: Secondary | ICD-10-CM | POA: Diagnosis not present

## 2023-07-28 DIAGNOSIS — I10 Essential (primary) hypertension: Secondary | ICD-10-CM | POA: Diagnosis not present

## 2023-07-28 DIAGNOSIS — E785 Hyperlipidemia, unspecified: Secondary | ICD-10-CM | POA: Diagnosis not present

## 2023-07-28 DIAGNOSIS — E039 Hypothyroidism, unspecified: Secondary | ICD-10-CM | POA: Diagnosis not present

## 2023-07-28 DIAGNOSIS — R42 Dizziness and giddiness: Secondary | ICD-10-CM | POA: Diagnosis not present

## 2023-07-28 DIAGNOSIS — R0781 Pleurodynia: Secondary | ICD-10-CM | POA: Diagnosis not present

## 2023-07-29 ENCOUNTER — Ambulatory Visit: Payer: 59 | Attending: Internal Medicine | Admitting: Internal Medicine

## 2023-07-29 ENCOUNTER — Encounter: Payer: Self-pay | Admitting: Internal Medicine

## 2023-07-29 VITALS — BP 130/80 | HR 82 | Ht 62.0 in | Wt 121.0 lb

## 2023-07-29 DIAGNOSIS — R748 Abnormal levels of other serum enzymes: Secondary | ICD-10-CM | POA: Diagnosis not present

## 2023-07-29 DIAGNOSIS — I2510 Atherosclerotic heart disease of native coronary artery without angina pectoris: Secondary | ICD-10-CM

## 2023-07-29 DIAGNOSIS — E785 Hyperlipidemia, unspecified: Secondary | ICD-10-CM

## 2023-07-29 DIAGNOSIS — I700 Atherosclerosis of aorta: Secondary | ICD-10-CM

## 2023-07-29 NOTE — Patient Instructions (Signed)
 Medication Instructions:  NO CHANGES  *If you need a refill on your cardiac medications before your next appointment, please call your pharmacy*   Lab Work: NMR lipoprofile and hepatic functional panel   If you have labs (blood work) drawn today and your tests are completely normal, you will receive your results only by: MyChart Message (if you have MyChart) OR A paper copy in the mail If you have any lab test that is abnormal or we need to change your treatment, we will call you to review the results.   Follow-Up: At [facility], you and your health needs are our priority.  As part of our continuing mission to provide you with exceptional heart care, we have created designated Provider Care Teams.  These Care Teams include your primary Cardiologist (physician) and Advanced Practice Providers (APPs -  Physician Assistants and Nurse Practitioners) who all work together to provide you with the care you need, when you need it.  We recommend signing up for the patient portal called "MyChart".  Sign up information is provided on this After Visit Summary.  MyChart is used to connect with patients for Virtual Visits (Telemedicine).  Patients are able to view lab/test results, encounter notes, upcoming appointments, etc.  Non-urgent messages can be sent to your provider as well.   To learn more about what you can do with MyChart, go to [email or web address].    Your next appointment:   6 months with [name] Bridegroom, NP

## 2023-07-29 NOTE — Progress Notes (Signed)
 OFFICE CONSULT NOTE  Chief Complaint:  Coronary artery calcification  Primary Care Physician: Garlan Fillers, MD  HPI:  [name] [name] is a 63 y.o. female who is being seen today for the evaluation of coronary artery calcification at the request of Garlan Fillers, MD. this is a pleasant 63 year old female kindly referred by Dr. Arelia Sneddon for a evaluation of coronary calcium.  She is a longtime smoker but recently has been cutting back.  She underwent a screening CT scan which showed aortic atherosclerosis and multivessel coronary artery calcification including left main calcium.  Today she says she is asymptomatic denying chest pain, worsening shortness of breath or associated symptoms.  She does have some fatigue however this is somewhat chronic.  She has a history of hypertension and blood pressure was elevated today however she says she has some whitecoat symptoms.  She is on medication for this.  She also was started on rosuvastatin for dyslipidemia by her PCP back in October.  Labs at that time showed total cholesterol 284, HDL 118, triglycerides 58 and LDL 154.  She was placed on rosuvastatin 20 mg daily which she said she took for several months however then she transferred her prescriptions to a different pharmacy and subsequently realized that she has not been taking the statin for several months.  She got her prescription filled yesterday and took her first dose today.  There is heart disease in the family including her father who had coronary artery disease.  04/21/2023  [name] [name] returns today for follow-up.  Overall she is doing well.  She had repeat lipid testing.  Fortunately LP(a) was negative.  Her lipid profile is further improved with an LDL particle number of 811, LDL 108, HDL 86 and triglycerides 78.  Small LDL particle was normal at less than 90 nmol/L.  She is tolerating therapy with rosuvastatin and has no issues with the medication.  07/29/2023  [name] [name]  is seen today in follow-up.  Unfortunately she was not able to get lab work before this visit.  Her last LDL in December was 108 and she was good to work on diet and lifestyle modifications.  She has been compliant with the rosuvastatin 20 mg daily and seems to be tolerating it well.  She denies any chest pain or shortness of breath.   PMHx:  Past Medical History:  Diagnosis Date   Concussion    Dizziness    GERD (gastroesophageal reflux disease)    Hypertension    Thyroid disease     Past Surgical History:  Procedure Laterality Date   CESAREAN SECTION N/A 1998   DIAGNOSTIC LAPAROSCOPY N/A    to eval endometriosis    FAMHx:  Family History  Problem Relation Age of Onset   Breast cancer Mother    Rheum arthritis Mother    Cancer Brother 50       mult myeloma   Cancer Father        gum   Heart failure Father    Kidney disease Father    Stomach cancer Paternal Grandmother 40   Colon cancer Neg Hx    Esophageal cancer Neg Hx     SOCHx:   reports that she has been smoking cigarettes. She has a 5 pack-year smoking history. She has never used smokeless tobacco. She reports current alcohol use of about 7.0 standard drinks of alcohol per week. She reports that she does not use drugs.  ALLERGIES:  Allergies  Allergen Reactions  Codeine Rash    halucinations    ROS: Pertinent items noted in HPI and remainder of comprehensive ROS otherwise negative.  HOME MEDS: Current Outpatient Medications on File Prior to Visit  Medication Sig Dispense Refill   albuterol (VENTOLIN HFA) 108 (90 Base) MCG/ACT inhaler Inhale 2 puffs into the lungs as needed.     ALPRAZolam (XANAX) 0.5 MG tablet Take 0.5 mg by mouth 2 (two) times daily as needed for sleep or anxiety.     COMIRNATY syringe      FLUBLOK 0.5 ML SOSY      fluticasone (FLONASE) 50 MCG/ACT nasal spray INSTILL 2 SPRAYS EACH NOSTRIL DAILY. for 90     hyoscyamine (LEVSIN SL) 0.125 MG SL tablet Place 0.125 mg under the tongue every  6 (six) hours as needed.     levothyroxine (SYNTHROID) 88 MCG tablet Take 88 mcg by mouth daily before breakfast.     meclizine (ANTIVERT) 25 MG tablet Take 25 mg by mouth 3 (three) times daily as needed for dizziness.     metoprolol succinate (TOPROL-XL) 50 MG 24 hr tablet Take 50 mg by mouth daily.     NON FORMULARY Salicylic acid 20% Urea 40% cream Sig: apply to affected area once or twice daily and cover as directed 30gm Refill Prn     olmesartan (BENICAR) 20 MG tablet 20 mg. daily     omeprazole (PRILOSEC) 40 MG capsule Take 1 capsule (40 mg total) by mouth daily. OFFICE VISIT DUE FOR FURTHER REFILLS 40 capsule 0   rosuvastatin (CRESTOR) 20 MG tablet 20 mg daily.     Vitamin D, Ergocalciferol, (DRISDOL) 50000 units CAPS capsule Take 50,000 Units by mouth every 7 (seven) days.     No current facility-administered medications on file prior to visit.    LABS/IMAGING: No results found for this or any previous visit (from the past 48 hours). No results found.  LIPID PANEL: No results found for: "CHOL", "TRIG", "HDL", "CHOLHDL", "VLDL", "LDLCALC", "LDLDIRECT"  WEIGHTS: Wt Readings from Last 3 Encounters:  07/29/23 121 lb (54.9 kg)  04/21/23 117 lb (53.1 kg)  12/21/22 116 lb 6.4 oz (52.8 kg)    VITALS: BP 130/80 (BP Location: Left Arm, Patient Position: Sitting, Cuff Size: Normal)   Pulse 82   Ht 5\' 2"  (1.575 m)   Wt 121 lb (54.9 kg)   SpO2 99%   BMI 22.13 kg/m   EXAM: Deferred  EKG: Deferred  ASSESSMENT: Multivessel coronary artery calcification (Seen on chest CT) Aortic atherosclerosis Hepatic steatosis Hypertension Tobacco use  PLAN: 1.   [name] [name] needs updated lipid testing.  She had a little bit of sweet tea today but no food.  Will go ahead and get lab work today.  I suspect she will need additional therapy either possibly ezetimibe or PCSK9 inhibitor.  Will check liver enzymes today as well as her lipid profile and I can make further adjustments based on  that.  Plan follow-up with our lipid APP in 6 months or sooner as possible.  Chrystie Nose, MD, [facility], FACP  [place]  [facility]  Medical Director of the Advanced Lipid Disorders &  Cardiovascular Risk Reduction Clinic Diplomate of the American Board of Clinical Lipidology Attending Cardiologist  Direct Dial: [phone]  Fax: [phone]  Website:  [email or web address] 07/29/2023, 11:41 AM

## 2023-07-30 LAB — NMR, LIPOPROFILE
Cholesterol, Total: 235 mg/dL — ABNORMAL HIGH (ref 100–199)
HDL Particle Number: 38.8 umol/L (ref >=30.5)
HDL-C: 93 mg/dL (ref >39)
LDL Particle Number: 1021 nmol/L — ABNORMAL HIGH (ref <1000)
LDL Size: 21.7 nm (ref >20.5)
LDL-C (NIH Calc): 133 mg/dL — ABNORMAL HIGH (ref 0–99)
LP-IR Score: <25 (ref <=45)
Small LDL Particle Number: <90 nmol/L (ref <=527)
Triglycerides: 52 mg/dL (ref 0–149)

## 2023-07-30 LAB — HEPATIC FUNCTION PANEL
ALT: 25 IU/L (ref 0–32)
AST: 29 IU/L (ref 0–40)
Albumin: 4.4 g/dL (ref 3.9–4.9)
Alkaline Phosphatase: 87 IU/L (ref 44–121)
Bilirubin Total: 0.5 mg/dL (ref 0.0–1.2)
Bilirubin, Direct: 0.16 mg/dL (ref 0.00–0.40)
Total Protein: 6.9 g/dL (ref 6.0–8.5)

## 2023-08-02 DIAGNOSIS — H35342 Macular cyst, hole, or pseudohole, left eye: Secondary | ICD-10-CM | POA: Diagnosis not present

## 2023-08-03 DIAGNOSIS — Z01419 Encounter for gynecological examination (general) (routine) without abnormal findings: Secondary | ICD-10-CM | POA: Diagnosis not present

## 2023-08-03 DIAGNOSIS — Z124 Encounter for screening for malignant neoplasm of cervix: Secondary | ICD-10-CM | POA: Diagnosis not present

## 2023-08-03 DIAGNOSIS — Z1151 Encounter for screening for human papillomavirus (HPV): Secondary | ICD-10-CM | POA: Diagnosis not present

## 2023-08-03 DIAGNOSIS — Z6821 Body mass index (BMI) 21.0-21.9, adult: Secondary | ICD-10-CM | POA: Diagnosis not present

## 2023-08-04 ENCOUNTER — Other Ambulatory Visit: Payer: Self-pay | Admitting: Obstetrics and Gynecology

## 2023-08-04 DIAGNOSIS — Z720 Tobacco use: Secondary | ICD-10-CM

## 2023-08-08 ENCOUNTER — Telehealth: Payer: Self-pay | Admitting: Pharmacy Technician

## 2023-08-08 ENCOUNTER — Other Ambulatory Visit (HOSPITAL_COMMUNITY): Payer: Self-pay

## 2023-08-08 DIAGNOSIS — E785 Hyperlipidemia, unspecified: Secondary | ICD-10-CM

## 2023-08-08 NOTE — Telephone Encounter (Signed)
 Pharmacy Patient Advocate Encounter   Received notification from Physician's Office that prior authorization for nexlizet is required/requested.   Insurance verification completed.   The patient is insured through [employer] .   Per test claim: PA required; PA submitted to above mentioned insurance via CoverMyMeds Key/confirmation #/EOC [id number] Status is pending

## 2023-08-09 NOTE — Telephone Encounter (Signed)
 Pharmacy Patient Advocate Encounter  Received notification from [employer] that Prior Authorization for nexlizet has been DENIED.  Full denial letter will be uploaded to the media tab. See denial reason below. Plan prefes nexletol and zetia separate   PA #/Case ID/Reference #: [id number]

## 2023-08-09 NOTE — Telephone Encounter (Signed)
 Chrystie Nose, MD  Lindell Spar, RN; Art Buff, CPhT Caller: Unspecified (Yesterday,  1:04 PM) Looks like they will cover Nexletol and zetia separate - please Rx for this.  -[name] [name]   Message sent to patient via MyChart

## 2023-08-12 DIAGNOSIS — H35342 Macular cyst, hole, or pseudohole, left eye: Secondary | ICD-10-CM | POA: Diagnosis not present

## 2023-08-15 MED ORDER — NEXLETOL 180 MG PO TABS: 180.0000 mg | ORAL_TABLET | Freq: Every day | ORAL | 3 refills | Status: AC

## 2023-08-15 MED ORDER — EZETIMIBE 10 MG PO TABS: 10.0000 mg | ORAL_TABLET | Freq: Every day | ORAL | 3 refills | Status: AC

## 2023-08-15 NOTE — Addendum Note (Signed)
 Addended by: Lindell Spar on: 08/15/2023 08:47 AM   Modules accepted: Orders

## 2023-08-22 DIAGNOSIS — H25812 Combined forms of age-related cataract, left eye: Secondary | ICD-10-CM | POA: Diagnosis not present

## 2023-08-22 DIAGNOSIS — H25811 Combined forms of age-related cataract, right eye: Secondary | ICD-10-CM | POA: Diagnosis not present

## 2023-08-24 DIAGNOSIS — I10 Essential (primary) hypertension: Secondary | ICD-10-CM | POA: Diagnosis not present

## 2023-08-24 DIAGNOSIS — E039 Hypothyroidism, unspecified: Secondary | ICD-10-CM | POA: Diagnosis not present

## 2023-08-24 DIAGNOSIS — H25811 Combined forms of age-related cataract, right eye: Secondary | ICD-10-CM | POA: Diagnosis not present

## 2023-09-07 DIAGNOSIS — I10 Essential (primary) hypertension: Secondary | ICD-10-CM | POA: Diagnosis not present

## 2023-09-07 DIAGNOSIS — H25812 Combined forms of age-related cataract, left eye: Secondary | ICD-10-CM | POA: Diagnosis not present

## 2023-09-07 DIAGNOSIS — E039 Hypothyroidism, unspecified: Secondary | ICD-10-CM | POA: Diagnosis not present

## 2023-10-18 ENCOUNTER — Other Ambulatory Visit: Payer: Self-pay | Admitting: Registered Nurse

## 2023-10-18 DIAGNOSIS — H538 Other visual disturbances: Secondary | ICD-10-CM | POA: Diagnosis not present

## 2023-10-18 DIAGNOSIS — F17200 Nicotine dependence, unspecified, uncomplicated: Secondary | ICD-10-CM | POA: Diagnosis not present

## 2023-10-18 DIAGNOSIS — R29818 Other symptoms and signs involving the nervous system: Secondary | ICD-10-CM

## 2023-10-18 DIAGNOSIS — R42 Dizziness and giddiness: Secondary | ICD-10-CM

## 2023-10-18 DIAGNOSIS — I10 Essential (primary) hypertension: Secondary | ICD-10-CM

## 2023-10-18 DIAGNOSIS — R4702 Dysphasia: Secondary | ICD-10-CM | POA: Diagnosis not present

## 2023-10-19 ENCOUNTER — Encounter: Payer: Self-pay | Admitting: Obstetrics and Gynecology

## 2023-10-20 ENCOUNTER — Ambulatory Visit
Admission: RE | Admit: 2023-10-20 | Discharge: 2023-10-20 | Disposition: A | Discharge status: Home / Self Care | Source: Ambulatory Visit | Attending: Registered Nurse | Admitting: Registered Nurse

## 2023-10-20 DIAGNOSIS — R9082 White matter disease, unspecified: Secondary | ICD-10-CM | POA: Diagnosis not present

## 2023-10-20 DIAGNOSIS — R42 Dizziness and giddiness: Secondary | ICD-10-CM

## 2023-10-20 DIAGNOSIS — I10 Essential (primary) hypertension: Secondary | ICD-10-CM

## 2023-10-20 DIAGNOSIS — R29818 Other symptoms and signs involving the nervous system: Secondary | ICD-10-CM

## 2023-10-20 DIAGNOSIS — H538 Other visual disturbances: Secondary | ICD-10-CM

## 2023-10-20 DIAGNOSIS — G319 Degenerative disease of nervous system, unspecified: Secondary | ICD-10-CM | POA: Diagnosis not present

## 2023-10-20 MED ORDER — GADOPICLENOL 0.5 MMOL/ML IV SOLN
5.0000 mL | Freq: Once | INTRAVENOUS | Status: AC | PRN
Start: 2023-10-20 — End: 2023-10-20
  Administered 2023-10-20: 5 mL via INTRAVENOUS

## 2023-11-08 DIAGNOSIS — H35342 Macular cyst, hole, or pseudohole, left eye: Secondary | ICD-10-CM | POA: Diagnosis not present

## 2023-11-08 DIAGNOSIS — E039 Hypothyroidism, unspecified: Secondary | ICD-10-CM | POA: Diagnosis not present

## 2023-11-14 ENCOUNTER — Other Ambulatory Visit: Payer: Self-pay | Admitting: Obstetrics and Gynecology

## 2023-11-14 DIAGNOSIS — Z1231 Encounter for screening mammogram for malignant neoplasm of breast: Secondary | ICD-10-CM

## 2023-11-15 ENCOUNTER — Other Ambulatory Visit

## 2023-11-22 DIAGNOSIS — H35342 Macular cyst, hole, or pseudohole, left eye: Secondary | ICD-10-CM | POA: Diagnosis not present

## 2023-11-22 DIAGNOSIS — E039 Hypothyroidism, unspecified: Secondary | ICD-10-CM | POA: Diagnosis not present

## 2023-11-22 DIAGNOSIS — Z1212 Encounter for screening for malignant neoplasm of rectum: Secondary | ICD-10-CM | POA: Diagnosis not present

## 2023-11-24 DIAGNOSIS — I2510 Atherosclerotic heart disease of native coronary artery without angina pectoris: Secondary | ICD-10-CM | POA: Diagnosis not present

## 2023-11-24 DIAGNOSIS — E039 Hypothyroidism, unspecified: Secondary | ICD-10-CM | POA: Diagnosis not present

## 2023-11-24 DIAGNOSIS — I10 Essential (primary) hypertension: Secondary | ICD-10-CM | POA: Diagnosis not present

## 2023-11-24 DIAGNOSIS — E785 Hyperlipidemia, unspecified: Secondary | ICD-10-CM | POA: Diagnosis not present

## 2023-11-25 ENCOUNTER — Other Ambulatory Visit

## 2023-11-29 ENCOUNTER — Ambulatory Visit
Admission: RE | Admit: 2023-11-29 | Discharge: 2023-11-29 | Disposition: A | Discharge status: Home / Self Care | Source: Ambulatory Visit | Attending: Obstetrics and Gynecology | Admitting: Obstetrics and Gynecology

## 2023-11-29 DIAGNOSIS — I2510 Atherosclerotic heart disease of native coronary artery without angina pectoris: Secondary | ICD-10-CM | POA: Diagnosis not present

## 2023-11-29 DIAGNOSIS — R82998 Other abnormal findings in urine: Secondary | ICD-10-CM | POA: Diagnosis not present

## 2023-11-29 DIAGNOSIS — E039 Hypothyroidism, unspecified: Secondary | ICD-10-CM | POA: Diagnosis not present

## 2023-11-29 DIAGNOSIS — J302 Other seasonal allergic rhinitis: Secondary | ICD-10-CM | POA: Diagnosis not present

## 2023-11-29 DIAGNOSIS — Z1231 Encounter for screening mammogram for malignant neoplasm of breast: Secondary | ICD-10-CM

## 2023-11-29 DIAGNOSIS — E785 Hyperlipidemia, unspecified: Secondary | ICD-10-CM | POA: Diagnosis not present

## 2023-11-29 DIAGNOSIS — I10 Essential (primary) hypertension: Secondary | ICD-10-CM | POA: Diagnosis not present

## 2023-11-29 DIAGNOSIS — F17210 Nicotine dependence, cigarettes, uncomplicated: Secondary | ICD-10-CM | POA: Diagnosis not present

## 2023-11-29 DIAGNOSIS — F419 Anxiety disorder, unspecified: Secondary | ICD-10-CM | POA: Diagnosis not present

## 2023-11-29 DIAGNOSIS — Z1339 Encounter for screening examination for other mental health and behavioral disorders: Secondary | ICD-10-CM | POA: Diagnosis not present

## 2023-11-29 DIAGNOSIS — Z1331 Encounter for screening for depression: Secondary | ICD-10-CM | POA: Diagnosis not present

## 2023-11-29 DIAGNOSIS — Z0000 Encounter for general adult medical examination without abnormal findings: Secondary | ICD-10-CM | POA: Diagnosis not present

## 2023-12-27 DIAGNOSIS — H35342 Macular cyst, hole, or pseudohole, left eye: Secondary | ICD-10-CM | POA: Diagnosis not present

## 2024-01-03 ENCOUNTER — Other Ambulatory Visit

## 2024-01-06 DIAGNOSIS — Z720 Tobacco use: Secondary | ICD-10-CM | POA: Diagnosis not present

## 2024-01-25 DIAGNOSIS — S52122A Displaced fracture of head of left radius, initial encounter for closed fracture: Secondary | ICD-10-CM | POA: Diagnosis not present

## 2024-01-25 DIAGNOSIS — S52502A Unspecified fracture of the lower end of left radius, initial encounter for closed fracture: Secondary | ICD-10-CM | POA: Diagnosis not present

## 2024-01-25 DIAGNOSIS — S52572A Other intraarticular fracture of lower end of left radius, initial encounter for closed fracture: Secondary | ICD-10-CM | POA: Diagnosis not present

## 2024-01-25 DIAGNOSIS — S63502A Unspecified sprain of left wrist, initial encounter: Secondary | ICD-10-CM | POA: Diagnosis not present

## 2024-01-26 DIAGNOSIS — S52502A Unspecified fracture of the lower end of left radius, initial encounter for closed fracture: Secondary | ICD-10-CM | POA: Diagnosis not present

## 2024-01-26 DIAGNOSIS — S59902A Unspecified injury of left elbow, initial encounter: Secondary | ICD-10-CM | POA: Diagnosis not present

## 2024-02-01 DIAGNOSIS — S52572A Other intraarticular fracture of lower end of left radius, initial encounter for closed fracture: Secondary | ICD-10-CM | POA: Diagnosis not present

## 2024-02-08 DIAGNOSIS — S52572A Other intraarticular fracture of lower end of left radius, initial encounter for closed fracture: Secondary | ICD-10-CM | POA: Diagnosis not present

## 2024-02-15 DIAGNOSIS — S52572A Other intraarticular fracture of lower end of left radius, initial encounter for closed fracture: Secondary | ICD-10-CM | POA: Diagnosis not present

## 2024-03-07 DIAGNOSIS — H35342 Macular cyst, hole, or pseudohole, left eye: Secondary | ICD-10-CM | POA: Diagnosis not present

## 2024-03-09 DIAGNOSIS — S52572A Other intraarticular fracture of lower end of left radius, initial encounter for closed fracture: Secondary | ICD-10-CM | POA: Diagnosis not present

## 2024-03-21 DIAGNOSIS — Z13820 Encounter for screening for osteoporosis: Secondary | ICD-10-CM | POA: Diagnosis not present

## 2024-04-04 DIAGNOSIS — S52572A Other intraarticular fracture of lower end of left radius, initial encounter for closed fracture: Secondary | ICD-10-CM | POA: Diagnosis not present

## 2024-04-23 DIAGNOSIS — M810 Age-related osteoporosis without current pathological fracture: Secondary | ICD-10-CM | POA: Diagnosis not present

## 2024-04-26 NOTE — Progress Notes (Signed)
 [name] [name]                                          MRN: [id number]   04/26/2024   The VBCI Quality Team Specialist reviewed this patient medical record for the purposes of chart review for care gap closure. The following were reviewed: chart review for care gap closure-controlling blood pressure.    VBCI Quality Team

## 2024-05-01 DIAGNOSIS — S52572D Other intraarticular fracture of lower end of left radius, subsequent encounter for closed fracture with routine healing: Secondary | ICD-10-CM | POA: Diagnosis not present
